# Patient Record
Sex: Female | Born: 1970 | Race: Black or African American | Hispanic: No | Smoking: Former smoker
Health system: Southern US, Community
[De-identification: ages and names within clinical notes are randomized; demographics above are authoritative.]

## PROBLEM LIST (undated history)

## (undated) DIAGNOSIS — M5136 Other intervertebral disc degeneration, lumbar region: Secondary | ICD-10-CM

## (undated) DIAGNOSIS — R200 Anesthesia of skin: Secondary | ICD-10-CM

## (undated) DIAGNOSIS — IMO0001 Reserved for inherently not codable concepts without codable children: Secondary | ICD-10-CM

## (undated) DIAGNOSIS — F329 Major depressive disorder, single episode, unspecified: Secondary | ICD-10-CM

## (undated) DIAGNOSIS — M51369 Other intervertebral disc degeneration, lumbar region without mention of lumbar back pain or lower extremity pain: Secondary | ICD-10-CM

## (undated) DIAGNOSIS — H409 Unspecified glaucoma: Secondary | ICD-10-CM

## (undated) DIAGNOSIS — E785 Hyperlipidemia, unspecified: Secondary | ICD-10-CM

## (undated) DIAGNOSIS — E039 Hypothyroidism, unspecified: Secondary | ICD-10-CM

## (undated) DIAGNOSIS — F32A Depression, unspecified: Secondary | ICD-10-CM

## (undated) DIAGNOSIS — F419 Anxiety disorder, unspecified: Secondary | ICD-10-CM

## (undated) DIAGNOSIS — M419 Scoliosis, unspecified: Secondary | ICD-10-CM

## (undated) DIAGNOSIS — E119 Type 2 diabetes mellitus without complications: Secondary | ICD-10-CM

## (undated) DIAGNOSIS — K429 Umbilical hernia without obstruction or gangrene: Secondary | ICD-10-CM

## (undated) DIAGNOSIS — K219 Gastro-esophageal reflux disease without esophagitis: Secondary | ICD-10-CM

## (undated) DIAGNOSIS — E079 Disorder of thyroid, unspecified: Secondary | ICD-10-CM

## (undated) DIAGNOSIS — S43006A Unspecified dislocation of unspecified shoulder joint, initial encounter: Secondary | ICD-10-CM

## (undated) HISTORY — PX: CERVICAL CONIZATION W/BX: SHX1330

## (undated) HISTORY — PX: INDUCED ABORTION: SHX677

## (undated) HISTORY — DX: Unspecified glaucoma: H40.9

## (undated) HISTORY — DX: Type 2 diabetes mellitus without complications: E11.9

---

## 1996-11-20 HISTORY — PX: TUBAL LIGATION: SHX77

## 1997-03-31 ENCOUNTER — Encounter: Admission: RE | Admit: 1997-03-31 | Discharge: 1997-05-07 | Payer: Self-pay | Admitting: *Deleted

## 1997-08-08 ENCOUNTER — Other Ambulatory Visit: Admission: RE | Admit: 1997-08-08 | Discharge: 1997-08-08 | Payer: Self-pay | Admitting: Otolaryngology

## 1997-09-02 ENCOUNTER — Emergency Department (HOSPITAL_COMMUNITY): Admission: EM | Admit: 1997-09-02 | Discharge: 1997-09-02 | Payer: Self-pay | Admitting: Emergency Medicine

## 1997-09-25 ENCOUNTER — Other Ambulatory Visit: Admission: RE | Admit: 1997-09-25 | Discharge: 1997-09-25 | Payer: Self-pay | Admitting: Family Medicine

## 1997-09-30 ENCOUNTER — Emergency Department (HOSPITAL_COMMUNITY): Admission: EM | Admit: 1997-09-30 | Discharge: 1997-09-30 | Payer: Self-pay | Admitting: Emergency Medicine

## 1998-07-17 ENCOUNTER — Other Ambulatory Visit: Admission: RE | Admit: 1998-07-17 | Discharge: 1998-07-17 | Payer: Self-pay | Admitting: Ophthalmology

## 1998-11-19 ENCOUNTER — Emergency Department (HOSPITAL_COMMUNITY): Admission: EM | Admit: 1998-11-19 | Discharge: 1998-11-19 | Payer: Self-pay | Admitting: Internal Medicine

## 1998-11-19 ENCOUNTER — Encounter: Payer: Self-pay | Admitting: Internal Medicine

## 1998-12-28 ENCOUNTER — Encounter: Admission: RE | Admit: 1998-12-28 | Discharge: 1998-12-28 | Payer: Self-pay | Admitting: Specialist

## 1999-01-02 ENCOUNTER — Encounter: Admission: RE | Admit: 1999-01-02 | Discharge: 1999-01-02 | Payer: Self-pay | Admitting: Specialist

## 1999-01-02 ENCOUNTER — Encounter: Payer: Self-pay | Admitting: Specialist

## 1999-03-01 HISTORY — PX: SHOULDER SURGERY: SHX246

## 1999-06-20 ENCOUNTER — Emergency Department (HOSPITAL_COMMUNITY): Admission: EM | Admit: 1999-06-20 | Discharge: 1999-06-20 | Payer: Self-pay | Admitting: Emergency Medicine

## 1999-06-20 ENCOUNTER — Encounter: Payer: Self-pay | Admitting: Emergency Medicine

## 1999-08-23 ENCOUNTER — Encounter: Admission: RE | Admit: 1999-08-23 | Discharge: 1999-09-15 | Payer: Self-pay | Admitting: Specialist

## 2001-09-01 ENCOUNTER — Encounter: Payer: Self-pay | Admitting: Emergency Medicine

## 2001-09-01 ENCOUNTER — Emergency Department (HOSPITAL_COMMUNITY): Admission: EM | Admit: 2001-09-01 | Discharge: 2001-09-01 | Payer: Self-pay | Admitting: Emergency Medicine

## 2001-09-05 ENCOUNTER — Encounter: Admission: RE | Admit: 2001-09-05 | Discharge: 2001-09-19 | Payer: Self-pay | Admitting: *Deleted

## 2001-12-07 ENCOUNTER — Emergency Department (HOSPITAL_COMMUNITY): Admission: EM | Admit: 2001-12-07 | Discharge: 2001-12-07 | Payer: Self-pay | Admitting: Emergency Medicine

## 2001-12-07 ENCOUNTER — Encounter: Payer: Self-pay | Admitting: Emergency Medicine

## 2002-05-16 ENCOUNTER — Emergency Department (HOSPITAL_COMMUNITY): Admission: EM | Admit: 2002-05-16 | Discharge: 2002-05-16 | Payer: Self-pay | Admitting: Emergency Medicine

## 2002-09-03 ENCOUNTER — Encounter: Payer: Self-pay | Admitting: Emergency Medicine

## 2002-09-03 ENCOUNTER — Encounter: Admission: RE | Admit: 2002-09-03 | Discharge: 2002-09-03 | Payer: Self-pay | Admitting: Emergency Medicine

## 2002-09-13 ENCOUNTER — Inpatient Hospital Stay (HOSPITAL_COMMUNITY): Admission: AD | Admit: 2002-09-13 | Discharge: 2002-09-14 | Payer: Self-pay | Admitting: Obstetrics & Gynecology

## 2002-12-26 ENCOUNTER — Encounter: Admission: RE | Admit: 2002-12-26 | Discharge: 2003-01-14 | Payer: Self-pay | Admitting: Emergency Medicine

## 2003-02-06 ENCOUNTER — Emergency Department (HOSPITAL_COMMUNITY): Admission: EM | Admit: 2003-02-06 | Discharge: 2003-02-06 | Payer: Self-pay

## 2003-03-01 HISTORY — PX: BACK SURGERY: SHX140

## 2003-04-22 ENCOUNTER — Ambulatory Visit (HOSPITAL_COMMUNITY): Admission: RE | Admit: 2003-04-22 | Discharge: 2003-04-22 | Payer: Self-pay | Admitting: Neurological Surgery

## 2003-05-29 ENCOUNTER — Inpatient Hospital Stay (HOSPITAL_COMMUNITY): Admission: RE | Admit: 2003-05-29 | Discharge: 2003-05-31 | Payer: Self-pay | Admitting: Neurological Surgery

## 2003-09-30 ENCOUNTER — Encounter: Admission: RE | Admit: 2003-09-30 | Discharge: 2003-11-13 | Payer: Self-pay | Admitting: Neurological Surgery

## 2003-10-02 ENCOUNTER — Other Ambulatory Visit: Admission: RE | Admit: 2003-10-02 | Discharge: 2003-10-02 | Payer: Self-pay | Admitting: Internal Medicine

## 2003-10-30 ENCOUNTER — Ambulatory Visit (HOSPITAL_COMMUNITY): Admission: RE | Admit: 2003-10-30 | Discharge: 2003-10-30 | Payer: Self-pay | Admitting: Neurological Surgery

## 2004-02-10 ENCOUNTER — Ambulatory Visit: Payer: Self-pay | Admitting: Family Medicine

## 2004-03-30 ENCOUNTER — Ambulatory Visit: Payer: Self-pay | Admitting: Family Medicine

## 2004-04-05 ENCOUNTER — Ambulatory Visit: Payer: Self-pay | Admitting: Family Medicine

## 2004-04-16 ENCOUNTER — Ambulatory Visit: Payer: Self-pay | Admitting: Family Medicine

## 2004-04-22 ENCOUNTER — Emergency Department (HOSPITAL_COMMUNITY): Admission: EM | Admit: 2004-04-22 | Discharge: 2004-04-22 | Payer: Self-pay | Admitting: Emergency Medicine

## 2004-04-26 ENCOUNTER — Ambulatory Visit: Payer: Self-pay | Admitting: Family Medicine

## 2004-08-02 ENCOUNTER — Emergency Department (HOSPITAL_COMMUNITY): Admission: EM | Admit: 2004-08-02 | Discharge: 2004-08-02 | Payer: Self-pay | Admitting: Family Medicine

## 2004-08-28 ENCOUNTER — Emergency Department (HOSPITAL_COMMUNITY): Admission: EM | Admit: 2004-08-28 | Discharge: 2004-08-28 | Payer: Self-pay | Admitting: Emergency Medicine

## 2004-09-21 ENCOUNTER — Ambulatory Visit: Payer: Self-pay | Admitting: Internal Medicine

## 2004-10-25 ENCOUNTER — Ambulatory Visit: Payer: Self-pay | Admitting: Family Medicine

## 2004-10-28 ENCOUNTER — Ambulatory Visit (HOSPITAL_COMMUNITY): Admission: RE | Admit: 2004-10-28 | Discharge: 2004-10-28 | Payer: Self-pay | Admitting: Family Medicine

## 2004-12-20 ENCOUNTER — Ambulatory Visit: Payer: Self-pay | Admitting: Family Medicine

## 2005-01-04 ENCOUNTER — Ambulatory Visit: Payer: Self-pay | Admitting: Family Medicine

## 2005-02-01 ENCOUNTER — Ambulatory Visit: Payer: Self-pay | Admitting: Family Medicine

## 2005-02-28 HISTORY — PX: ELBOW SURGERY: SHX618

## 2005-04-25 ENCOUNTER — Emergency Department (HOSPITAL_COMMUNITY): Admission: EM | Admit: 2005-04-25 | Discharge: 2005-04-25 | Payer: Self-pay | Admitting: Emergency Medicine

## 2005-04-28 ENCOUNTER — Encounter: Admission: RE | Admit: 2005-04-28 | Discharge: 2005-04-28 | Payer: Self-pay | Admitting: Orthopedic Surgery

## 2005-05-03 ENCOUNTER — Inpatient Hospital Stay (HOSPITAL_COMMUNITY): Admission: RE | Admit: 2005-05-03 | Discharge: 2005-05-05 | Payer: Self-pay | Admitting: Orthopedic Surgery

## 2005-06-09 ENCOUNTER — Encounter: Admission: RE | Admit: 2005-06-09 | Discharge: 2005-09-07 | Payer: Self-pay | Admitting: Orthopedic Surgery

## 2005-06-15 ENCOUNTER — Ambulatory Visit: Payer: Self-pay | Admitting: Family Medicine

## 2005-06-15 ENCOUNTER — Encounter (INDEPENDENT_AMBULATORY_CARE_PROVIDER_SITE_OTHER): Payer: Self-pay | Admitting: Family Medicine

## 2005-07-14 ENCOUNTER — Ambulatory Visit: Payer: Self-pay | Admitting: Family Medicine

## 2005-08-10 ENCOUNTER — Ambulatory Visit: Payer: Self-pay | Admitting: Family Medicine

## 2005-08-23 ENCOUNTER — Ambulatory Visit: Payer: Self-pay | Admitting: Family Medicine

## 2005-09-07 ENCOUNTER — Ambulatory Visit: Payer: Self-pay | Admitting: Family Medicine

## 2006-03-16 ENCOUNTER — Emergency Department (HOSPITAL_COMMUNITY): Admission: EM | Admit: 2006-03-16 | Discharge: 2006-03-16 | Payer: Self-pay | Admitting: Family Medicine

## 2006-10-02 ENCOUNTER — Ambulatory Visit: Payer: Self-pay | Admitting: Family Medicine

## 2006-10-03 ENCOUNTER — Encounter (INDEPENDENT_AMBULATORY_CARE_PROVIDER_SITE_OTHER): Payer: Self-pay | Admitting: Family Medicine

## 2006-10-03 LAB — CONVERTED CEMR LAB: TSH: 36.527 microintl units/mL — ABNORMAL HIGH (ref 0.350–5.50)

## 2006-10-24 DIAGNOSIS — E0500 Thyrotoxicosis with diffuse goiter without thyrotoxic crisis or storm: Secondary | ICD-10-CM | POA: Insufficient documentation

## 2006-10-24 DIAGNOSIS — I10 Essential (primary) hypertension: Secondary | ICD-10-CM | POA: Insufficient documentation

## 2006-10-24 DIAGNOSIS — Z87410 Personal history of cervical dysplasia: Secondary | ICD-10-CM | POA: Insufficient documentation

## 2006-10-24 DIAGNOSIS — E039 Hypothyroidism, unspecified: Secondary | ICD-10-CM | POA: Insufficient documentation

## 2006-10-26 DIAGNOSIS — J309 Allergic rhinitis, unspecified: Secondary | ICD-10-CM | POA: Insufficient documentation

## 2006-11-07 ENCOUNTER — Ambulatory Visit: Payer: Self-pay | Admitting: Family Medicine

## 2006-11-07 ENCOUNTER — Telehealth (INDEPENDENT_AMBULATORY_CARE_PROVIDER_SITE_OTHER): Payer: Self-pay | Admitting: *Deleted

## 2006-12-07 ENCOUNTER — Ambulatory Visit: Payer: Self-pay | Admitting: Family Medicine

## 2006-12-07 LAB — CONVERTED CEMR LAB: TSH: 7.989 microintl units/mL — ABNORMAL HIGH (ref 0.350–5.50)

## 2006-12-08 ENCOUNTER — Telehealth (INDEPENDENT_AMBULATORY_CARE_PROVIDER_SITE_OTHER): Payer: Self-pay | Admitting: Family Medicine

## 2007-08-16 ENCOUNTER — Ambulatory Visit: Payer: Self-pay | Admitting: Internal Medicine

## 2007-08-16 LAB — CONVERTED CEMR LAB
ALT: 14 units/L (ref 0–35)
AST: 17 units/L (ref 0–37)
Albumin: 4.1 g/dL (ref 3.5–5.2)
Alkaline Phosphatase: 70 units/L (ref 39–117)
BUN: 9 mg/dL (ref 6–23)
Basophils Absolute: 0 10*3/uL (ref 0.0–0.1)
Basophils Relative: 0 % (ref 0–1)
CO2: 23 meq/L (ref 19–32)
Calcium: 9 mg/dL (ref 8.4–10.5)
Chloride: 107 meq/L (ref 96–112)
Cholesterol: 157 mg/dL (ref 0–200)
Creatinine, Ser: 0.74 mg/dL (ref 0.40–1.20)
Eosinophils Absolute: 0.1 10*3/uL (ref 0.0–0.7)
Eosinophils Relative: 2 % (ref 0–5)
Glucose, Bld: 74 mg/dL (ref 70–99)
HCT: 41.1 % (ref 36.0–46.0)
HDL: 42 mg/dL (ref >39)
Hemoglobin: 12.2 g/dL (ref 12.0–15.0)
LDL Cholesterol: 93 mg/dL (ref 0–99)
Lymphocytes Relative: 35 % (ref 12–46)
Lymphs Abs: 2.3 10*3/uL (ref 0.7–4.0)
MCHC: 29.7 g/dL — ABNORMAL LOW (ref 30.0–36.0)
MCV: 97.9 fL (ref 78.0–100.0)
Monocytes Absolute: 0.4 10*3/uL (ref 0.1–1.0)
Monocytes Relative: 7 % (ref 3–12)
Neutro Abs: 3.7 10*3/uL (ref 1.7–7.7)
Neutrophils Relative %: 57 % (ref 43–77)
Platelets: 289 10*3/uL (ref 150–400)
Potassium: 4.6 meq/L (ref 3.5–5.3)
RBC: 4.2 M/uL (ref 3.87–5.11)
RDW: 15.7 % — ABNORMAL HIGH (ref 11.5–15.5)
Sodium: 140 meq/L (ref 135–145)
TSH: 2.572 microintl units/mL (ref 0.350–5.50)
Total Bilirubin: 0.9 mg/dL (ref 0.3–1.2)
Total CHOL/HDL Ratio: 3.7
Total Protein: 6.9 g/dL (ref 6.0–8.3)
Triglycerides: 109 mg/dL (ref <150)
VLDL: 22 mg/dL (ref 0–40)
WBC: 6.6 10*3/uL (ref 4.0–10.5)

## 2007-08-17 ENCOUNTER — Encounter (INDEPENDENT_AMBULATORY_CARE_PROVIDER_SITE_OTHER): Payer: Self-pay | Admitting: Internal Medicine

## 2007-08-17 ENCOUNTER — Ambulatory Visit (HOSPITAL_COMMUNITY): Admission: RE | Admit: 2007-08-17 | Discharge: 2007-08-17 | Payer: Self-pay | Admitting: Internal Medicine

## 2007-08-29 ENCOUNTER — Encounter (INDEPENDENT_AMBULATORY_CARE_PROVIDER_SITE_OTHER): Payer: Self-pay | Admitting: *Deleted

## 2007-09-25 ENCOUNTER — Telehealth (INDEPENDENT_AMBULATORY_CARE_PROVIDER_SITE_OTHER): Payer: Self-pay | Admitting: *Deleted

## 2007-09-26 ENCOUNTER — Ambulatory Visit: Payer: Self-pay | Admitting: Family Medicine

## 2007-09-26 DIAGNOSIS — N63 Unspecified lump in unspecified breast: Secondary | ICD-10-CM | POA: Insufficient documentation

## 2007-10-02 ENCOUNTER — Encounter: Admission: RE | Admit: 2007-10-02 | Discharge: 2007-10-02 | Payer: Self-pay | Admitting: Family Medicine

## 2008-02-29 DIAGNOSIS — R200 Anesthesia of skin: Secondary | ICD-10-CM

## 2008-02-29 HISTORY — DX: Anesthesia of skin: R20.0

## 2008-11-09 ENCOUNTER — Emergency Department (HOSPITAL_COMMUNITY): Admission: EM | Admit: 2008-11-09 | Discharge: 2008-11-09 | Payer: Self-pay | Admitting: Emergency Medicine

## 2009-05-21 ENCOUNTER — Encounter: Payer: Self-pay | Admitting: Physician Assistant

## 2010-03-21 ENCOUNTER — Encounter: Payer: Self-pay | Admitting: Neurological Surgery

## 2010-04-01 NOTE — Progress Notes (Signed)
  Phone Note Outgoing Call   Call placed by: Donia Guiles MD,  December 08, 2006 7:26 PM Call placed to: Patient Action Taken: Phone Call Completed Reason for Call: Discuss lab or test results Details of Action Taken: TSH a bit high-increase to 0.125 mg levothyroid    New/Updated Medications: SYNTHROID 125 MCG  TABS (LEVOTHYROXINE SODIUM) one by mouth q day   Prescriptions: SYNTHROID 125 MCG  TABS (LEVOTHYROXINE SODIUM) one by mouth q day  #30 x 6   Entered and Authorized by:   Donia Guiles MD   Signed by:   Donia Guiles MD on 12/08/2006   Method used:   Electronically sent to ...       [facility] [phone]       [address] [place] [address]       [place], [place]  [address]       Ph: [phone] or [phone]       Fax: [phone]   RxID:   [id number]

## 2010-04-01 NOTE — Progress Notes (Signed)
Summary: OFFICE VISIT/  PT IS UNDER A LOT OF PAIN/ DR Barbaraann Barthel  Phone Note Call from Patient Call back at [facility] Phone [phone]   Caller: Patient Complaint: Cough/Sore throat, Earache/Ear Infection Summary of Call: THE PT IS FEELING VERY BAD (BAD COUGH/SORE THROAT AND EARACHE/ SHE IS UNDER A LOT OF PAIN. SHE WANTS TO BE SEEN TODAY IF THAT IS POSSIBLE. DR Barbaraann Barthel PT Initial call taken by: Manon Hilding,  November 07, 2006 10:15 AM  Follow-up for Phone Call        Appt Scheduled Today Follow-up by: Gaylyn Cheers RN,  November 07, 2006 10:30 AM

## 2010-04-01 NOTE — Letter (Signed)
Summary: MAILED REQUESTED RECORDS TO [facility]  MAILED REQUESTED RECORDS TO [facility]   Imported By: Arta Bruce 05/21/2009 15:34:14  _____________________________________________________________________  External Attachment:    Type:   Image     Comment:   External Document

## 2010-06-04 LAB — URINALYSIS, ROUTINE W REFLEX MICROSCOPIC
Bilirubin Urine: NEGATIVE
Glucose, UA: NEGATIVE mg/dL
Ketones, ur: NEGATIVE mg/dL
Nitrite: NEGATIVE
Protein, ur: NEGATIVE mg/dL
Specific Gravity, Urine: 1.02 (ref 1.005–1.030)
Urobilinogen, UA: 1 mg/dL (ref 0.0–1.0)
pH: 5.5 (ref 5.0–8.0)

## 2010-06-04 LAB — DIFFERENTIAL
Basophils Absolute: 0 10*3/uL (ref 0.0–0.1)
Basophils Relative: 0 % (ref 0–1)
Eosinophils Absolute: 0.1 10*3/uL (ref 0.0–0.7)
Eosinophils Relative: 1 % (ref 0–5)
Lymphocytes Relative: 22 % (ref 12–46)
Lymphs Abs: 2.2 10*3/uL (ref 0.7–4.0)
Monocytes Absolute: 0.4 10*3/uL (ref 0.1–1.0)
Monocytes Relative: 4 % (ref 3–12)
Neutro Abs: 7.4 10*3/uL (ref 1.7–7.7)
Neutrophils Relative %: 73 % (ref 43–77)

## 2010-06-04 LAB — COMPREHENSIVE METABOLIC PANEL
ALT: 19 U/L (ref 0–35)
AST: 26 U/L (ref 0–37)
Albumin: 4 g/dL (ref 3.5–5.2)
Alkaline Phosphatase: 71 U/L (ref 39–117)
BUN: 14 mg/dL (ref 6–23)
CO2: 24 mEq/L (ref 19–32)
Calcium: 9 mg/dL (ref 8.4–10.5)
Chloride: 103 mEq/L (ref 96–112)
Creatinine, Ser: 1.18 mg/dL (ref 0.4–1.2)
GFR calc Af Amer: >60 mL/min (ref >60)
GFR calc non Af Amer: 51 mL/min — ABNORMAL LOW (ref >60)
Glucose, Bld: 143 mg/dL — ABNORMAL HIGH (ref 70–99)
Potassium: 3.5 mEq/L (ref 3.5–5.1)
Sodium: 137 mEq/L (ref 135–145)
Total Bilirubin: 0.7 mg/dL (ref 0.3–1.2)
Total Protein: 7.5 g/dL (ref 6.0–8.3)

## 2010-06-04 LAB — CBC
HCT: 37.1 % (ref 36.0–46.0)
Hemoglobin: 12.4 g/dL (ref 12.0–15.0)
MCHC: 33.3 g/dL (ref 30.0–36.0)
MCV: 91.9 fL (ref 78.0–100.0)
Platelets: 232 10*3/uL (ref 150–400)
RBC: 4.04 MIL/uL (ref 3.87–5.11)
RDW: 15 % (ref 11.5–15.5)
WBC: 10.2 10*3/uL (ref 4.0–10.5)

## 2010-06-04 LAB — GLUCOSE, CAPILLARY: Glucose-Capillary: 124 mg/dL — ABNORMAL HIGH (ref 70–99)

## 2010-06-04 LAB — URINE MICROSCOPIC-ADD ON

## 2010-06-04 LAB — POCT CARDIAC MARKERS
CKMB, poc: 1.1 ng/mL (ref 1.0–8.0)
CKMB, poc: <1 ng/mL — ABNORMAL LOW (ref 1.0–8.0)
Myoglobin, poc: 121 ng/mL (ref 12–200)
Myoglobin, poc: 89.8 ng/mL (ref 12–200)
Troponin i, poc: <0.05 ng/mL (ref 0.00–0.09)
Troponin i, poc: <0.05 ng/mL (ref 0.00–0.09)

## 2010-06-04 LAB — POCT PREGNANCY, URINE: Preg Test, Ur: NEGATIVE

## 2010-07-16 NOTE — Op Note (Signed)
NAMEELLAINA, SCHULER NO.:  [id number]   MEDICAL RECORD NO.:  [id number]          PATIENT TYPE:  INP   LOCATION:  5030                         FACILITY:  [facility]   PHYSICIAN:  Dionne Ano. Gramig III, M.D.DATE OF BIRTH:  April 29, 1970   DATE OF PROCEDURE:  05/03/2005  DATE OF DISCHARGE:                                 OPERATIVE REPORT   PREOPERATIVE DIAGNOSIS:  Fracture subluxation involving the radiocapitellar  joint, right elbow, with collateral ligament injury.   POSTOPERATIVE DIAGNOSIS:  Fracture subluxation involving the radiocapitellar  joint, right elbow, with collateral ligament injury.   PROCEDURE:  1.  Open reduction and internal fixation and fracture subluxation, right      elbow, with capitellum shear fracture.  2.  Lateral ulnar collateral ligament reconstruction and repair.  3.  Stress radiography.   SURGEON:  Dionne Ano. Amanda Pea, M.D.   ASSISTANT:  Karie Chimera, P.A.-C.   COMPLICATIONS:  None.   ANESTHESIA:  General.   TOURNIQUET TIME:  Less than an hour.   COMPLICATIONS:  None.   INDICATION FOR PROCEDURE:  This patient is a very pleasant female who  presents with a capitellum shear fracture.  She has a fracture subluxation  of her elbow secondary to the fracture.  She has collateral ligament  insufficiency, pain and presents for reconstructions.  I discussed the risks  and benefits of surgery including the risks of infection, bleeding,  anesthesia, damage to normal structures and failure of surgery to accomplish  it intended goals of relieving symptoms and restoring function; with this in  mind, she desired to proceed.  All questions have been encouraged and  answered preoperatively.   OPERATIVE PROCEDURE:  The patient was seen by myself and Anesthesia and  taken to the operating suite, where she underwent smooth induction of IV  sedation followed by general anesthesia.  She was laid supine and  appropriately padded, prepped and draped in  the normal sterile fashion with  Betadine scrub and paint.  Once this was done, arm was elevated and  tourniquet was insufflated to 350 mmHg, Ancef was given and the operation  commenced with a lateral incision.  The lateral incision was carried down to  the fascia.  Skin edges were elevated off of the fascia.  I then performed  creation of an interval about the ECRB and ECU.  I carefully elevated the  ECRB and ECRL anteriorly, exposing the elbow joint and the capitellum  fracture.  The patient had the joint lavaged.  I evaluated this very  carefully.  Following this, meticulous and tedious dissection was  accomplished with combination of curettage, Freer elevator, distraction and  towel clamp.  I then performed ORIF of the very displaced capitellum shear  fracture.  The joint was thus reduced, restoring the radiocapitellar joint  anatomy.  I then placed 2 Accutrak screws, mini in nature, size 18 in  length, from back to front, engaging the capitellum; this provided excellent  fixation of the capitellum shear fracture.  This was checked under AP  lateral x-ray and oblique x-ray.  I performed guidepin placement, followed  by measurement, followed by placement of the screws.  I took care not to  violate the subchondral bone and checked this with visual confirmation via  4.0 loupe magnification.  The patient was placed through a full range of  motion and I was quite pleased with the findings.  Following this, I then  performed repair with FilterWire of the ulnar collateral ligament.  This was  tightened nicely and repaired back down to its origin, where previous trauma  had injured it.  Once this was done, I then performed copious irrigation  with the tourniquet down; final copy x-rays were made and stress fluoroscopy  was performed.  I then closed the wound with FilterWire in the fascia,  followed by Vicryl in the subcu and skin edge was then closed.  A posterior  plaster splint was  accomplished.  She was awoken from anesthesia,  transferred to the recovery room, given additional antibiotics and will be  admitted for pain control, postoperative observation, etc.  The patient  tolerated the procedure well, there were no complicating features and all  sponge, needle and instrument counts were reported as correct.  She will be  monitored closely in the postoperative period and of course followed in my  office.           ______________________________  Dionne Ano. Everlene Other, M.D.     Nash Mantis  D:  05/04/2005  T:  05/05/2005  Job:  [id number]

## 2010-07-16 NOTE — Op Note (Signed)
Karen Nixon, Karen Nixon                            ACCOUNT NO.:  [id number]   MEDICAL RECORD NO.:  [id number]                   PATIENT TYPE:  INP   LOCATION:  2899                                 FACILITY:  [facility]   PHYSICIAN:  Stefani Dama, M.D.               DATE OF BIRTH:  11/14/1970   DATE OF PROCEDURE:  05/29/2003  DATE OF DISCHARGE:                                 OPERATIVE REPORT   PREOPERATIVE DIAGNOSIS:  Lumbar spondylosis and herniated nucleus pulposus  with left lumbar radiculopathy and spondylosis L3-4 and L4-5.  Herniated  nucleus pulposus L5-S1, left with left lumbar radiculopathy.   POSTOPERATIVE DIAGNOSIS:  Lumbar spondylosis and herniated nucleus pulposus  with left lumbar radiculopathy and spondylosis L3-4 and L4-5.  Herniated  nucleus pulposus L5-S1, left with left lumbar radiculopathy.   OPERATION PERFORMED:  L3-4, L4-5 and L5-S1 lumbar laminotomies, L5-S1  diskectomy with operating microscope and microdissection technique.   SURGEON:  Danae Orleans. Venetia Maxon, M.D.   ANESTHESIA:  General endotracheal.   INDICATIONS FOR PROCEDURE:  The patient is a 40 year old individual who has  had significant back and left lower extremity pain.  She has evidence of  herniated nucleus pulposus at L5-S1 on the left side in addition to severe  spondylitic disease at 3-4 and 4-5.  She has been advised regarding surgical  decompression having failed extensive efforts at conservative management.   DESCRIPTION OF PROCEDURE:  The patient was brought to the operating room  supine on the stretcher.  After smooth induction of general endotracheal  anesthesia, she was turned prone, her back was shaved, prepped with DuraPrep  and draped in sterile fashion.  A midline incision was created on the  left  side of the lumbar spine.  The lumbodorsal fascia was opened on the left  side and a marker was placed at the L3 interspace to identify the L3-4 disk  space.  Dissection was then carried inferiorly  to expose L3-4, L4-5 and L5-  S1.  Subperiosteal dissection was performed and then a McCullough retractor  was placed in the wound to retract the tissues laterally.  At L5-S1 a  laminotomy was then created removing the inferior margin and lamina of L5  out the mesial wall of the facet using a high speed drill and a 5 mm round  bit.  Dissection was then taken with the 2 and 3 mm Kerrison punch to remove  thickened redundant yellow ligament.  As the common dural tube was  identified, the lateral aspect of it was noted to be severely compressed  with severe lateral recess stenosis.  This involved the S1 nerve root.  The  nerve root was noted to be blanched and dorsally displaced.  Further  dissection of the yellow ligament freed the nerve and by dissected on the  lateral aspect using a microdissection technique under the operating  microscope, the nerve could be gradually mobilized.  Once the nerve was  mobilized there were noted to be several fragments of disk underneath the  nerve root and these were immediately decompressed.  The area was inspected  carefully and no other fragments of disk were encountered.  The opening in  the posterior longitudinal ligament where the disk had come from was  carefully searched for and there was noted to be a very small rent with a  very closed disk space.  It was not felt that it would be possible to see  any more disk material coming through this space and therefore, it was not  explored or opened further.  With this area being decompressed, attention  was then turned to L4-5 and L3-4.  Here again with the aid of microscopic  visualization, laminotomy was created removing the inferior margin of the  lamina of L4 out to the mesial wall of the facet.  Medial facetectomy was  also performed, the yellow ligament was taken up and the common dural tube  was explored.  There was noted to be some lateral recess stenosis of the L5  nerve root and this was  decompressed using the high speed air drill with a  2.5 mm dissecting tool.  Once this area was decompressed, again care was  taken to make sure that the disk was not degenerated or exposed or open.  There was noted to be a small central bulge in this area; however, it was  noted that the ligament was intact and therefore it was not violated.  Dissection was then carried up to 3-4 where a similar procedure was carried  out doing a laminotomy removing the inferior margin of the lamina of L3 out  to the mesial wall of the facet and removing the redundant yellow ligament.  Common dural tube here was explored and there was noted to be some lateral  recess stenosis along the L4 nerve root inferiorly.  This was cleared with a  high speed air drill and a 2.8 mm dissecting tool.  Microdissection  technique was used throughout.  Dr. Venetia Maxon helped with this case to provide  retraction on the common dural tube and the S1 nerve root.  Hemostasis was  then carefully and meticulously obtained in the epidural space.  40 mg of  Depo-Medrol, 25 mcg of fentanyl was left in the epidural space and the  lumbodorsal fascia was reapproximated in its original position and closed  with #1 Vicryl.  2-0 Vicryl was used in the subcutaneous tissues and  subcuticular tissue was closed with 3-0 Vicryl.  The patient tolerated the  procedure well and was returned to the recovery room in stable condition.                                               Stefani Dama, M.D.    [name]  D:  05/29/2003  T:  05/29/2003  Job:  [id number]

## 2010-07-16 NOTE — Discharge Summary (Signed)
Karen Nixon, Karen Nixon                            ACCOUNT NO.:  [id number]   MEDICAL RECORD NO.:  [id number]                   PATIENT TYPE:  INP   LOCATION:  3013                                 FACILITY:  [facility]   PHYSICIAN:  Stefani Dama, M.D.               DATE OF BIRTH:  May 28, 1970   DATE OF ADMISSION:  05/29/2003  DATE OF DISCHARGE:  05/31/2003                                 DISCHARGE SUMMARY   ADMITTING DIAGNOSES:  1. Lumbar spondylosis.  2. Herniated nucleus pulposus, L5-S1 on the left.  3. Left lumbar radiculopathy.   DISCHARGE AND FINAL DIAGNOSES:  1. Lumbar spondylosis.  2. Herniated nucleus pulposus, L5-S1 on the left.  3. Left lumbar radiculopathy.   OPERATIONS:  1. Laminotomy L3-L4, L4-L5, and L5-S1.  2. Diskectomy L5-S1 with operating microscope, microdissection technique, on     May 29, 2003.   CONDITION ON DISCHARGE:  Improving.   HOSPITAL COURSE:  Ms. Racanelli is a 40 year old individual who has had  significant back and left lower extremity  pain.  She was found to have  significant spondylotic disease at three levels, in addition to what  appeared to a herniated nucleus pulposus at L5-S1.  She underwent surgical  extirpation of the disk.  Postoperatively, she had a fair amount of back  soreness secondary to the extent of the operation.  She was ambulated during  the first postoperative day and gradually improved to where she became  mobile using only oral pain medication.  She was able to void.  Her incision  was clean and dry.  On the second postoperative day, she was doing well  enough to be able to handle herself at home with her children only.   She is discharged home with a prescription for:  1. Percocet #40, without refills.  2. Valium 5 mg #30, without refills.   She will be seen in the office in three weeks' time for further followup.                                                Stefani Dama, M.D.    [name]  D:  05/31/2003  T:   06/01/2003  Job:  [id number]

## 2010-09-01 ENCOUNTER — Emergency Department (HOSPITAL_COMMUNITY)
Admission: EM | Admit: 2010-09-01 | Discharge: 2010-09-01 | Disposition: A | Discharge status: Home / Self Care | Payer: Self-pay | Attending: Emergency Medicine | Admitting: Emergency Medicine

## 2010-09-01 ENCOUNTER — Emergency Department (HOSPITAL_COMMUNITY): Payer: Self-pay

## 2010-09-01 DIAGNOSIS — R5381 Other malaise: Secondary | ICD-10-CM | POA: Insufficient documentation

## 2010-09-01 DIAGNOSIS — Z79899 Other long term (current) drug therapy: Secondary | ICD-10-CM | POA: Insufficient documentation

## 2010-09-01 DIAGNOSIS — R112 Nausea with vomiting, unspecified: Secondary | ICD-10-CM | POA: Insufficient documentation

## 2010-09-01 DIAGNOSIS — G43909 Migraine, unspecified, not intractable, without status migrainosus: Secondary | ICD-10-CM | POA: Insufficient documentation

## 2010-09-01 LAB — POCT I-STAT, CHEM 8
BUN: 17 mg/dL (ref 6–23)
Calcium, Ion: 1.2 mmol/L (ref 1.12–1.32)
Chloride: 112 mEq/L (ref 96–112)
Creatinine, Ser: 0.8 mg/dL (ref 0.50–1.10)
Glucose, Bld: 68 mg/dL — ABNORMAL LOW (ref 70–99)
HCT: 36 % (ref 36.0–46.0)
Hemoglobin: 12.2 g/dL (ref 12.0–15.0)
Potassium: 3.6 mEq/L (ref 3.5–5.1)
Sodium: 144 mEq/L (ref 135–145)
TCO2: 22 mmol/L (ref 0–100)

## 2010-09-01 LAB — URINALYSIS, ROUTINE W REFLEX MICROSCOPIC
Bilirubin Urine: NEGATIVE
Glucose, UA: NEGATIVE mg/dL
Ketones, ur: NEGATIVE mg/dL
Leukocytes, UA: NEGATIVE
Nitrite: NEGATIVE
Protein, ur: NEGATIVE mg/dL
Specific Gravity, Urine: 1.026 (ref 1.005–1.030)
Urobilinogen, UA: 0.2 mg/dL (ref 0.0–1.0)
pH: 5.5 (ref 5.0–8.0)

## 2010-09-01 LAB — URINE MICROSCOPIC-ADD ON

## 2010-09-01 LAB — PREGNANCY, URINE: Preg Test, Ur: NEGATIVE

## 2011-06-07 ENCOUNTER — Encounter (HOSPITAL_COMMUNITY): Payer: Self-pay | Admitting: *Deleted

## 2011-06-07 ENCOUNTER — Emergency Department (INDEPENDENT_AMBULATORY_CARE_PROVIDER_SITE_OTHER)
Admission: EM | Admit: 2011-06-07 | Discharge: 2011-06-07 | Disposition: A | Discharge status: Home / Self Care | Payer: Self-pay | Source: Home / Self Care | Attending: Emergency Medicine | Admitting: Emergency Medicine

## 2011-06-07 DIAGNOSIS — K115 Sialolithiasis: Secondary | ICD-10-CM

## 2011-06-07 HISTORY — DX: Other intervertebral disc degeneration, lumbar region without mention of lumbar back pain or lower extremity pain: M51.369

## 2011-06-07 HISTORY — DX: Other intervertebral disc degeneration, lumbar region: M51.36

## 2011-06-07 HISTORY — DX: Hyperlipidemia, unspecified: E78.5

## 2011-06-07 HISTORY — DX: Disorder of thyroid, unspecified: E07.9

## 2011-06-07 HISTORY — DX: Scoliosis, unspecified: M41.9

## 2011-06-07 NOTE — ED Notes (Addendum)
C/O when she eats she gets a lump the size of a pebble under her tongue (midline)  Begins when starts to eat and goes away about 30 minutes after she finishes eating. Not painful. Onset a week ago.Says bump turns whiteish gray when it is there and then goes back to normal.

## 2011-06-07 NOTE — ED Provider Notes (Signed)
History     CSN: [id number]  Arrival date & time 06/07/11  4098   First MD Initiated Contact with Patient 06/07/11 1936      Chief Complaint  Patient presents with  . Oral Swelling    (Consider location/radiation/quality/duration/timing/severity/associated sxs/prior treatment) HPI Comments: 41 year old female with history of hypothyroidism. Here c/o discomfort below her tongue for 1 week. Explains she feels a knot below her tongue that forms while she is eating and takes about 30 min after finishing her meal to go away. Denies pain. Symptoms started 1 week ago. No purulent discharge. No fever, chills or malaise. Has not had this problems before.    Past Medical History  Diagnosis Date  . Thyroid disease   . Hyperlipemia   . Degenerative disc disease, lumbar   . Scoliosis     Past Surgical History  Procedure Date  . Back surgery   . Shoulder surgery   . Elbow surgery   . Tubal ligation   . Cervical conization w/bx   . Induced abortion     Family History  Problem Relation Age of Onset  . Cancer Mother   . Hypertension Brother     History  Substance Use Topics  . Smoking status: Former Games developer  . Smokeless tobacco: Never Used  . Alcohol Use: Yes     occasionally    OB History    Grav Para Term Preterm Abortions TAB SAB Ect Mult Living   3 2 2  1     2       Review of Systems  Constitutional: Negative for fever, chills and appetite change.  HENT: Negative for congestion, sore throat, facial swelling, trouble swallowing and neck pain.   Gastrointestinal: Negative for abdominal pain.  All other systems reviewed and are negative.    Allergies  Iohexol and Naproxen sodium  Home Medications  No current outpatient prescriptions on file.  BP 124/91  Pulse 80  Temp(Src) 98.4 F (36.9 C) (Oral)  Resp 18  SpO2 100%  LMP 04/13/2011  Physical Exam  Nursing note and vitals reviewed. Constitutional: She appears well-developed and well-nourished. No distress.   HENT:  Head: Normocephalic and atraumatic.  Nose: Nose normal.  Mouth/Throat: Oropharynx is clear and moist. No oropharyngeal exudate.       Mild swelling with no erythema or tenderness to palpation of the left sublingual salivary duct. Impress small stone at same location on palpation of the mouth floor. No pus drainage while milking the sublingual salivary glands. No pain. No mucosal ulcers or lesions.   Eyes: Conjunctivae are normal. Pupils are equal, round, and reactive to light.  Neck: Neck supple.  Cardiovascular: Normal heart sounds.   Pulmonary/Chest: Effort normal and breath sounds normal.  Lymphadenopathy:    She has no cervical adenopathy.  Neurological: She is alert.  Skin: No rash noted.    ED Course  Procedures (including critical care time)  Labs Reviewed - No data to display No results found.   1. Sialolithiasis       MDM  supportive care reccommended. Warm compress, good hydration sour candy and instructed how to milk the sublingual salivary glands. ENT referral as needed.           Sharin Grave, MD 06/10/11 [phone]

## 2011-06-07 NOTE — Discharge Instructions (Signed)
My impression is that you have a small stone obstructing the duct of your left salivary gland below your tongue. It does not look infected. Is very important to keep well-hydrated and you can also use warm compress and lemon drop candy to try to force the stone out and keep the fluid of saliva normal. Go to the ENT specialist number provided above if persistent or worsening symptoms. Is very important to seek medical attention if you develop pain, persistent swelling or purulent discharge as sometimes these problems can lead to infection and in that case he would require antibiotic treatment and close monitoring.   Salivary Stone Your exam shows you have a stone in one of your saliva glands. These small stones form around a mucous plug in the ducts of the glands and cause the saliva in the gland to be blocked. This makes the gland swollen and painful, especially when you eat. If repeated episodes occur, the gland can become infected. Sometimes these stones can be seen on x-ray. Treatment includes stimulating the production of saliva to push the stone out. You should suck on a lemon or sour candies several times daily. Antibiotic medicine may be needed if the gland is infected. Increasing fluids, applying warm compresses to the swollen area 3-4 times daily, and massaging the gland from back to front may encourage drainage and passage of the stone. Surgical treatment to remove the stone is sometimes necessary, so proper medical follow up is very important. Call your doctor for an appointment as recommended. Call right away if you have a high fever, severe headache, vomiting, uncontrolled pain, or other serious symptoms. Document Released: 03/24/2004 Document Revised: 02/03/2011 Document Reviewed: 02/14/2005 [facility] Patient Information 2012 [place], [place].

## 2011-06-15 ENCOUNTER — Emergency Department (HOSPITAL_COMMUNITY): Payer: Medicaid Other

## 2011-06-15 ENCOUNTER — Encounter (HOSPITAL_COMMUNITY): Payer: Self-pay | Admitting: *Deleted

## 2011-06-15 ENCOUNTER — Emergency Department (HOSPITAL_COMMUNITY)
Admission: EM | Admit: 2011-06-15 | Discharge: 2011-06-15 | Disposition: A | Discharge status: Home / Self Care | Payer: Medicaid Other | Attending: Emergency Medicine | Admitting: Emergency Medicine

## 2011-06-15 DIAGNOSIS — M545 Low back pain, unspecified: Secondary | ICD-10-CM | POA: Insufficient documentation

## 2011-06-15 DIAGNOSIS — M79609 Pain in unspecified limb: Secondary | ICD-10-CM | POA: Insufficient documentation

## 2011-06-15 DIAGNOSIS — E785 Hyperlipidemia, unspecified: Secondary | ICD-10-CM | POA: Insufficient documentation

## 2011-06-15 DIAGNOSIS — N390 Urinary tract infection, site not specified: Secondary | ICD-10-CM

## 2011-06-15 DIAGNOSIS — M51379 Other intervertebral disc degeneration, lumbosacral region without mention of lumbar back pain or lower extremity pain: Secondary | ICD-10-CM | POA: Insufficient documentation

## 2011-06-15 DIAGNOSIS — M5137 Other intervertebral disc degeneration, lumbosacral region: Secondary | ICD-10-CM | POA: Insufficient documentation

## 2011-06-15 DIAGNOSIS — M4120 Other idiopathic scoliosis, site unspecified: Secondary | ICD-10-CM | POA: Insufficient documentation

## 2011-06-15 DIAGNOSIS — M549 Dorsalgia, unspecified: Secondary | ICD-10-CM

## 2011-06-15 LAB — URINALYSIS, ROUTINE W REFLEX MICROSCOPIC
Bilirubin Urine: NEGATIVE
Glucose, UA: NEGATIVE mg/dL
Hgb urine dipstick: NEGATIVE
Ketones, ur: 15 mg/dL — AB
Nitrite: NEGATIVE
Protein, ur: NEGATIVE mg/dL
Specific Gravity, Urine: 1.024 (ref 1.005–1.030)
Urobilinogen, UA: 2 mg/dL — ABNORMAL HIGH (ref 0.0–1.0)
pH: 6.5 (ref 5.0–8.0)

## 2011-06-15 LAB — URINE MICROSCOPIC-ADD ON

## 2011-06-15 LAB — POCT PREGNANCY, URINE: Preg Test, Ur: NEGATIVE

## 2011-06-15 MED ORDER — IBUPROFEN 800 MG PO TABS: 800.0000 mg | ORAL_TABLET | Freq: Three times a day (TID) | ORAL | Status: AC

## 2011-06-15 MED ORDER — OXYCODONE-ACETAMINOPHEN 5-325 MG PO TABS
2.0000 | ORAL_TABLET | Freq: Once | ORAL | Status: AC
  Administered 2011-06-15: 2 via ORAL
  Filled 2011-06-15: qty 2

## 2011-06-15 MED ORDER — OXYCODONE-ACETAMINOPHEN 5-325 MG PO TABS
1.0000 | ORAL_TABLET | Freq: Once | ORAL | Status: AC
  Administered 2011-06-15: 1 via ORAL
  Filled 2011-06-15: qty 1

## 2011-06-15 MED ORDER — DIAZEPAM 5 MG PO TABS
10.0000 mg | ORAL_TABLET | Freq: Once | ORAL | Status: AC
  Administered 2011-06-15: 10 mg via ORAL
  Filled 2011-06-15: qty 2

## 2011-06-15 MED ORDER — SULFAMETHOXAZOLE-TRIMETHOPRIM 800-160 MG PO TABS: 1.0000 | ORAL_TABLET | Freq: Two times a day (BID) | ORAL | Status: AC

## 2011-06-15 MED ORDER — OXYCODONE-ACETAMINOPHEN 5-325 MG PO TABS: ORAL_TABLET | ORAL | Status: AC

## 2011-06-15 MED ORDER — IBUPROFEN 800 MG PO TABS
800.0000 mg | ORAL_TABLET | Freq: Once | ORAL | Status: AC
  Administered 2011-06-15: 800 mg via ORAL
  Filled 2011-06-15: qty 1

## 2011-06-15 NOTE — ED Notes (Signed)
Patient with back pain, patient states that her pain is worse than ever.  Patient having a hard time moving.  Patient is CAOx3.

## 2011-06-15 NOTE — ED Notes (Signed)
Ambulated pt in hallway 25 feet with assistance of her cane.  Pt needed assistance getting from lying to sitting position in bed. When walking, pt reports having increasing dull radiating pain down her left leg.  Pt describes pain starting in her hip and "feels like something is weighing down and pushing down my bladder and my lower back like a fire in my hip".  Pt stated pain is about the same as it was when she came in to the ED - rates pain 10/10.  Pt does report pain was better when she way laying down but is worse when she walks.

## 2011-06-15 NOTE — ED Provider Notes (Signed)
Pt is s/p lumbar diskectomy in 2005 Here for back pain  She is awake/alert, maex4, denies urinary/fecal incontinence Will treat pain and reassess BP 108/82  Pulse 92  Temp(Src) 98.1 F (36.7 C) (Oral)  Resp 20  SpO2 98%  LMP 04/13/2011   Joya Gaskins, MD 06/15/11 [phone]

## 2011-06-15 NOTE — ED Provider Notes (Signed)
History     CSN: [id number]  Arrival date & time 06/15/11  7829   First MD Initiated Contact with Patient 06/15/11 [phone]      Chief Complaint  Patient presents with  . Back Pain    lower    (Consider location/radiation/quality/duration/timing/severity/associated sxs/prior treatment) HPI  Patient who states she has some degree of daily chronic back pain following "lower back surgery" in 2005 by Dr. Danielle Dess presents to the ER complaining of a one week hx of gradually increasing lower back pain stating mild increase in pain throughout the week despite taking OTC pain meds but states pain acutely became worse this morning when she was getting out of the bed to use the bathroom and she felt a "pop" sensation in left lower back and since then increased pain with radiation of pain down left leg. Patient denies abdominal pain, loss of bowel or bladder function, saddle seat paresthesias, extremity numbness/tingling/weakness, dysuria, hematuria or blood in stool. Patient states pain is aggravated by movement but constant. Patient is also complaining of a few day hx of mild suprapubic fullness though again denies dysuria, hematuria or flank pain. She denies increased urinary frequency or hesitation. Patient is followed by Teena Irani at [facility]. She states allergy to naproxen but is able to take ibuprofen.  Past Medical History  Diagnosis Date  . Thyroid disease   . Hyperlipemia   . Degenerative disc disease, lumbar   . Scoliosis     Past Surgical History  Procedure Date  . Back surgery   . Shoulder surgery   . Elbow surgery   . Tubal ligation   . Cervical conization w/bx   . Induced abortion     Family History  Problem Relation Age of Onset  . Cancer Mother   . Hypertension Brother     History  Substance Use Topics  . Smoking status: Former Games developer  . Smokeless tobacco: Never Used  . Alcohol Use: Yes     occasionally    OB History    Grav Para Term Preterm  Abortions TAB SAB Ect Mult Living   3 2 2  1     2       Review of Systems  All other systems reviewed and are negative.    Allergies  Iohexol and Naproxen sodium  Home Medications  No current outpatient prescriptions on file.  BP 108/82  Pulse 92  Temp(Src) 98.1 F (36.7 C) (Oral)  Resp 20  SpO2 98%  LMP 04/13/2011  Physical Exam  Nursing note and vitals reviewed. Constitutional: She is oriented to person, place, and time. She appears well-developed and well-nourished. No distress.  HENT:  Head: Normocephalic and atraumatic.  Eyes: Conjunctivae are normal.  Neck: Normal range of motion. Neck supple.  Cardiovascular: Normal rate, regular rhythm, normal heart sounds and intact distal pulses.  Exam reveals no gallop and no friction rub.   No murmur heard. Pulmonary/Chest: Effort normal and breath sounds normal. No respiratory distress. She has no wheezes. She has no rales. She exhibits no tenderness.  Abdominal: Soft. Bowel sounds are normal. She exhibits no distension and no mass. There is no tenderness. There is no rebound and no guarding.  Genitourinary: Rectum normal. Rectal exam shows anal tone normal.  Musculoskeletal: Normal range of motion. She exhibits no edema and no tenderness.       TTP of lower lumbar spine and left paraspinal region. No skin changes or crepitous.   Neurological: She is alert and  oriented to person, place, and time. She has normal reflexes.  Skin: Skin is warm and dry. No rash noted. She is not diaphoretic. No erythema.  Psychiatric: She has a normal mood and affect.    ED Course  Procedures (including critical care time)  PO percocet and valium.   PO ibuprofen   Labs Reviewed  URINALYSIS, ROUTINE W REFLEX MICROSCOPIC   Dg Lumbar Spine Complete  06/15/2011  *RADIOLOGY REPORT*  Clinical Data: Lambert Mody lower back pain.  LUMBAR SPINE - COMPLETE 4+ VIEW  Comparison: Lumbar spine radiograph performed 04/22/2004  Findings: There is no evidence  of fracture or subluxation.  Right convex thoracic scoliosis is noted.  Vertebral bodies demonstrate normal height and alignment.  Intervertebral disc spaces are preserved.  The visualized bowel gas pattern is unremarkable in appearance; air and stool are noted within the colon.  The sacroiliac joints are within normal limits.  IMPRESSION: Right convex thoracic scoliosis noted; no evidence of fracture or subluxation along the lumbar spine.  Original Report Authenticated By: Tonia Ghent, M.D.     1. Back pain   2. UTI (lower urinary tract infection)       MDM  Acute on chronic lower back pain with no signs or symptoms of central cord compression or cauda equina. Negative straight leg raise. Normal rectal tone. Normal sensation of entire LE's and saddle seat region. Ambulating without difficulty. Bilateral extremities are neurovasc intact. No TTP of chest or abdomen  Patient's pain has improved after PO percocet and valium           Jenness [place], [place] 06/15/11 1441

## 2011-06-15 NOTE — ED Notes (Signed)
Pt has hx of back surgery in 2005.  Pt sts she got up to bathroom and heard a pop in lower back and now hard to walk.  Pt has pain to lift legs.  No numbness or tingling and complains of weight on bladder for one week

## 2011-06-15 NOTE — Discharge Instructions (Signed)
Take antibiotic in complete course and stay well hydrated. Follow up with your primary care provider for recheck of your urine in one week. Alternate between percocet and ibuprofen as directed for back pain but do not drive or operate machinery with percocet use. Follow up with Dr. Danielle Dess for recheck of ongoing back pain but return to ER for any changing or worsening of symptoms.   Urinary Tract Infection Infections of the urinary tract can start in several places. A bladder infection (cystitis), a kidney infection (pyelonephritis), and a prostate infection (prostatitis) are different types of urinary tract infections (UTIs). They usually get better if treated with medicines (antibiotics) that kill germs. Take all the medicine until it is gone. You or your child may feel better in a few days, but TAKE ALL MEDICINE or the infection may not respond and may become more difficult to treat. HOME CARE INSTRUCTIONS   Drink enough water and fluids to keep the urine clear or pale yellow. Cranberry juice is especially recommended, in addition to large amounts of water.   Avoid caffeine, tea, and carbonated beverages. They tend to irritate the bladder.   Alcohol may irritate the prostate.   Only take over-the-counter or prescription medicines for pain, discomfort, or fever as directed by your caregiver.  To prevent further infections:  Empty the bladder often. Avoid holding urine for long periods of time.   After a bowel movement, women should cleanse from front to back. Use each tissue only once.   Empty the bladder before and after sexual intercourse.  FINDING OUT THE RESULTS OF YOUR TEST Not all test results are available during your visit. If your or your child's test results are not back during the visit, make an appointment with your caregiver to find out the results. Do not assume everything is normal if you have not heard from your caregiver or the medical facility. It is important for you to  follow up on all test results. SEEK MEDICAL CARE IF:   There is back pain.   Your baby is older than 3 months with a rectal temperature of 100.5 F (38.1 C) or higher for more than 1 day.   Your or your child's problems (symptoms) are no better in 3 days. Return sooner if you or your child is getting worse.  SEEK IMMEDIATE MEDICAL CARE IF:   There is severe back pain or lower abdominal pain.   You or your child develops chills.   You have a fever.   Your baby is older than 3 months with a rectal temperature of 102 F (38.9 C) or higher.   Your baby is 44 months old or younger with a rectal temperature of 100.4 F (38 C) or higher.   There is nausea or vomiting.   There is continued burning or discomfort with urination.  MAKE SURE YOU:   Understand these instructions.   Will watch your condition.   Will get help right away if you are not doing well or get worse.  Document Released: 11/24/2004 Document Revised: 02/03/2011 Document Reviewed: 06/29/2006 [facility] Patient Information 2012 [place], [place].  Back Pain, Adult Low back pain is very common. About 1 in 5 people have back pain.The cause of low back pain is rarely dangerous. The pain often gets better over time.About half of people with a sudden onset of back pain feel better in just 2 weeks. About 8 in 10 people feel better by 6 weeks.  CAUSES Some common causes of back pain include:  Strain of the muscles or ligaments supporting the spine.   Wear and tear (degeneration) of the spinal discs.   Arthritis.   Direct injury to the back.  DIAGNOSIS Most of the time, the direct cause of low back pain is not known.However, back pain can be treated effectively even when the exact cause of the pain is unknown.Answering your caregiver's questions about your overall health and symptoms is one of the most accurate ways to make sure the cause of your pain is not dangerous. If your caregiver needs more information, he or she  may order lab work or imaging tests (X-rays or MRIs).However, even if imaging tests show changes in your back, this usually does not require surgery. HOME CARE INSTRUCTIONS For many people, back pain returns.Since low back pain is rarely dangerous, it is often a condition that people can learn to [facility] their own.   Remain active. It is stressful on the back to sit or stand in one place. Do not sit, drive, or stand in one place for more than 30 minutes at a time. Take short walks on level surfaces as soon as pain allows.Try to increase the length of time you walk each day.   Do not stay in bed.Resting more than 1 or 2 days can delay your recovery.   Do not avoid exercise or work.Your body is made to move.It is not dangerous to be active, even though your back may hurt.Your back will likely heal faster if you return to being active before your pain is gone.   Pay attention to your body when you bend and lift. Many people have less discomfortwhen lifting if they bend their knees, keep the load close to their bodies,and avoid twisting. Often, the most comfortable positions are those that put less stress on your recovering back.   Find a comfortable position to sleep. Use a firm mattress and lie on your side with your knees slightly bent. If you lie on your back, put a pillow under your knees.   Only take over-the-counter or prescription medicines as directed by your caregiver. Over-the-counter medicines to reduce pain and inflammation are often the most helpful.Your caregiver may prescribe muscle relaxant drugs.These medicines help dull your pain so you can more quickly return to your normal activities and healthy exercise.   Put ice on the injured area.   Put ice in a plastic bag.   Place a towel between your skin and the bag.   Leave the ice on for 15 to 20 minutes, 3 to 4 times a day for the first 2 to 3 days. After that, ice and heat may be alternated to reduce pain and spasms.     Ask your caregiver about trying back exercises and gentle massage. This may be of some benefit.   Avoid feeling anxious or stressed.Stress increases muscle tension and can worsen back pain.It is important to recognize when you are anxious or stressed and learn ways to manage it.Exercise is a great option.  SEEK MEDICAL CARE IF:  You have pain that is not relieved with rest or medicine.   You have pain that does not improve in 1 week.   You have new symptoms.   You are generally not feeling well.  SEEK IMMEDIATE MEDICAL CARE IF:   You have pain that radiates from your back into your legs.   You develop new bowel or bladder control problems.   You have unusual weakness or numbness in your arms or legs.  You develop nausea or vomiting.   You develop abdominal pain.   You feel faint.  Document Released: 02/14/2005 Document Revised: 02/03/2011 Document Reviewed: 07/05/2010 [facility] Patient Information 2012 [place], [place].

## 2011-06-22 NOTE — ED Provider Notes (Signed)
Medical screening examination/treatment/procedure(s) were performed by non-physician practitioner and as supervising physician I was immediately available for consultation/collaboration.  Sunnie Nielsen, MD 06/22/11 2303

## 2012-05-10 ENCOUNTER — Other Ambulatory Visit (HOSPITAL_COMMUNITY): Payer: Self-pay | Admitting: Internal Medicine

## 2012-05-10 DIAGNOSIS — Z1231 Encounter for screening mammogram for malignant neoplasm of breast: Secondary | ICD-10-CM

## 2012-05-17 ENCOUNTER — Ambulatory Visit (HOSPITAL_COMMUNITY)
Admission: RE | Admit: 2012-05-17 | Discharge: 2012-05-17 | Disposition: A | Discharge status: Home / Self Care | Payer: Medicaid Other | Source: Ambulatory Visit | Attending: Internal Medicine | Admitting: Internal Medicine

## 2012-05-17 DIAGNOSIS — Z1231 Encounter for screening mammogram for malignant neoplasm of breast: Secondary | ICD-10-CM | POA: Insufficient documentation

## 2012-12-07 ENCOUNTER — Other Ambulatory Visit: Payer: Self-pay | Admitting: Orthopaedic Surgery

## 2012-12-07 DIAGNOSIS — M25511 Pain in right shoulder: Secondary | ICD-10-CM

## 2012-12-18 ENCOUNTER — Ambulatory Visit
Admission: RE | Admit: 2012-12-18 | Discharge: 2012-12-18 | Disposition: A | Discharge status: Home / Self Care | Payer: Medicaid Other | Source: Ambulatory Visit | Attending: Orthopaedic Surgery | Admitting: Orthopaedic Surgery

## 2012-12-18 DIAGNOSIS — M25511 Pain in right shoulder: Secondary | ICD-10-CM

## 2012-12-18 MED ORDER — IOHEXOL 180 MG/ML  SOLN
15.0000 mL | Freq: Once | INTRAMUSCULAR | Status: AC | PRN
  Administered 2012-12-18: 15 mL via INTRA_ARTICULAR

## 2013-02-18 ENCOUNTER — Encounter: Payer: Self-pay | Admitting: Advanced Practice Midwife

## 2013-03-01 ENCOUNTER — Ambulatory Visit: Payer: Self-pay | Admitting: Advanced Practice Midwife

## 2013-08-09 ENCOUNTER — Other Ambulatory Visit (HOSPITAL_COMMUNITY): Payer: Self-pay | Admitting: Internal Medicine

## 2013-08-09 DIAGNOSIS — Z0000 Encounter for general adult medical examination without abnormal findings: Secondary | ICD-10-CM

## 2013-08-13 ENCOUNTER — Other Ambulatory Visit (HOSPITAL_COMMUNITY): Payer: Self-pay | Admitting: Internal Medicine

## 2013-08-13 ENCOUNTER — Ambulatory Visit (HOSPITAL_COMMUNITY)
Admission: RE | Admit: 2013-08-13 | Discharge: 2013-08-13 | Disposition: A | Discharge status: Home / Self Care | Payer: Medicaid Other | Source: Ambulatory Visit | Attending: Internal Medicine | Admitting: Internal Medicine

## 2013-08-13 DIAGNOSIS — Z1231 Encounter for screening mammogram for malignant neoplasm of breast: Secondary | ICD-10-CM

## 2013-08-13 DIAGNOSIS — Z0000 Encounter for general adult medical examination without abnormal findings: Secondary | ICD-10-CM

## 2013-12-30 ENCOUNTER — Encounter (HOSPITAL_COMMUNITY): Payer: Self-pay | Admitting: *Deleted

## 2014-03-20 ENCOUNTER — Other Ambulatory Visit (HOSPITAL_COMMUNITY): Payer: Self-pay | Admitting: Oral and Maxillofacial Surgery

## 2014-03-20 DIAGNOSIS — D492 Neoplasm of unspecified behavior of bone, soft tissue, and skin: Secondary | ICD-10-CM

## 2014-03-20 DIAGNOSIS — D165 Benign neoplasm of lower jaw bone: Secondary | ICD-10-CM

## 2014-03-25 ENCOUNTER — Ambulatory Visit (HOSPITAL_COMMUNITY)
Admission: RE | Admit: 2014-03-25 | Discharge: 2014-03-25 | Disposition: A | Discharge status: Home / Self Care | Payer: Medicaid Other | Source: Ambulatory Visit | Attending: Oral and Maxillofacial Surgery | Admitting: Oral and Maxillofacial Surgery

## 2014-03-25 DIAGNOSIS — D492 Neoplasm of unspecified behavior of bone, soft tissue, and skin: Secondary | ICD-10-CM | POA: Diagnosis not present

## 2014-03-25 DIAGNOSIS — D165 Benign neoplasm of lower jaw bone: Secondary | ICD-10-CM

## 2014-04-08 ENCOUNTER — Ambulatory Visit: Payer: Medicaid Other | Admitting: Certified Nurse Midwife

## 2014-04-21 ENCOUNTER — Ambulatory Visit: Payer: Self-pay | Admitting: Obstetrics & Gynecology

## 2014-06-27 ENCOUNTER — Emergency Department (HOSPITAL_COMMUNITY): Payer: Medicaid Other

## 2014-06-27 ENCOUNTER — Emergency Department (HOSPITAL_COMMUNITY)
Admission: EM | Admit: 2014-06-27 | Discharge: 2014-06-27 | Disposition: A | Discharge status: Home / Self Care | Payer: Medicaid Other | Attending: Emergency Medicine | Admitting: Emergency Medicine

## 2014-06-27 ENCOUNTER — Encounter (HOSPITAL_COMMUNITY): Payer: Self-pay | Admitting: Emergency Medicine

## 2014-06-27 DIAGNOSIS — Z87891 Personal history of nicotine dependence: Secondary | ICD-10-CM | POA: Diagnosis not present

## 2014-06-27 DIAGNOSIS — M549 Dorsalgia, unspecified: Secondary | ICD-10-CM

## 2014-06-27 DIAGNOSIS — M419 Scoliosis, unspecified: Secondary | ICD-10-CM | POA: Insufficient documentation

## 2014-06-27 DIAGNOSIS — G8929 Other chronic pain: Secondary | ICD-10-CM | POA: Diagnosis not present

## 2014-06-27 DIAGNOSIS — M545 Low back pain: Secondary | ICD-10-CM | POA: Insufficient documentation

## 2014-06-27 DIAGNOSIS — Z8639 Personal history of other endocrine, nutritional and metabolic disease: Secondary | ICD-10-CM | POA: Diagnosis not present

## 2014-06-27 DIAGNOSIS — Z3202 Encounter for pregnancy test, result negative: Secondary | ICD-10-CM | POA: Diagnosis not present

## 2014-06-27 LAB — URINALYSIS, ROUTINE W REFLEX MICROSCOPIC
Bilirubin Urine: NEGATIVE
Glucose, UA: NEGATIVE mg/dL
Ketones, ur: NEGATIVE mg/dL
Nitrite: NEGATIVE
Protein, ur: NEGATIVE mg/dL
Specific Gravity, Urine: 1.02 (ref 1.005–1.030)
Urobilinogen, UA: 0.2 mg/dL (ref 0.0–1.0)
pH: 5 (ref 5.0–8.0)

## 2014-06-27 LAB — PREGNANCY, URINE: Preg Test, Ur: NEGATIVE

## 2014-06-27 LAB — URINE MICROSCOPIC-ADD ON

## 2014-06-27 MED ORDER — PREDNISONE 10 MG PO TABS: ORAL_TABLET | ORAL | Status: DC
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
## 2014-06-27 NOTE — ED Notes (Addendum)
Pt arrived from home by [facility] with c/o lower back pain. Pain started Friday last week, was seen by pain clinic Monday and was started on new medication Nucynta. Pt stated that the medication is not helping. Pain originally on left side of back and now on right side of back for the past 2 days. EMS administered Fentanyl 13mcg with no relief.

## 2014-06-27 NOTE — Discharge Instructions (Signed)
Follow up with your pain doctor for continued or worsening symptoms Chronic Pain Chronic pain can be defined as pain that is off and on and lasts for 3-6 months or longer. Many things cause chronic pain, which can make it difficult to make a diagnosis. There are many treatment options available for chronic pain. However, finding a treatment that works well for you may require trying various approaches until the right one is found. Many people benefit from a combination of two or more types of treatment to control their pain. SYMPTOMS  Chronic pain can occur anywhere in the body and can range from mild to very severe. Some types of chronic pain include:  Headache.  Low back pain.  Cancer pain.  Arthritis pain.  Neurogenic pain. This is pain resulting from damage to nerves. People with chronic pain may also have other symptoms such as:  Depression.  Anger.  Insomnia.  Anxiety. DIAGNOSIS  Your health care provider will help diagnose your condition over time. In many cases, the initial focus will be on excluding possible conditions that could be causing the pain. Depending on your symptoms, your health care provider may order tests to diagnose your condition. Some of these tests may include:   Blood tests.   CT scan.   MRI.   X-rays.   Ultrasounds.   Nerve conduction studies.  You may need to see a specialist.  TREATMENT  Finding treatment that works well may take time. You may be referred to a pain specialist. He or she may prescribe medicine or therapies, such as:   Mindful meditation or yoga.  Shots (injections) of numbing or pain-relieving medicines into the spine or area of pain.  Local electrical stimulation.  Acupuncture.   Massage therapy.   Aroma, color, light, or sound therapy.   Biofeedback.   Working with a physical therapist to keep from getting stiff.   Regular, gentle exercise.   Cognitive or behavioral therapy.   Group support.   Sometimes, surgery may be recommended.  HOME CARE INSTRUCTIONS   Take all medicines as directed by your health care provider.   Lessen stress in your life by relaxing and doing things such as listening to calming music.   Exercise or be active as directed by your health care provider.   Eat a healthy diet and include things such as vegetables, fruits, fish, and lean meats in your diet.   Keep all follow-up appointments with your health care provider.   Attend a support group with others suffering from chronic pain. SEEK MEDICAL CARE IF:   Your pain gets worse.   You develop a new pain that was not there before.   You cannot tolerate medicines given to you by your health care provider.   You have new symptoms since your last visit with your health care provider.  SEEK IMMEDIATE MEDICAL CARE IF:   You feel weak.   You have decreased sensation or numbness.   You lose control of bowel or bladder function.   Your pain suddenly gets much worse.   You develop shaking.  You develop chills.  You develop confusion.  You develop chest pain.  You develop shortness of breath.  MAKE SURE YOU:  Understand these instructions.  Will watch your condition.  Will get help right away if you are not doing well or get worse. Document Released: 11/06/2001 Document Revised: 10/17/2012 Document Reviewed: 08/10/2012 [facility] Patient Information 2015 [place], [place]. This information is not intended to replace advice given to  you by your health care provider. Make sure you discuss any questions you have with your health care provider.

## 2014-06-28 LAB — URINE CULTURE: Colony Count: >=100000

## 2014-07-29 ENCOUNTER — Emergency Department (HOSPITAL_COMMUNITY): Payer: Medicaid Other

## 2014-07-29 ENCOUNTER — Encounter (HOSPITAL_COMMUNITY): Payer: Self-pay | Admitting: Emergency Medicine

## 2014-07-29 ENCOUNTER — Emergency Department (HOSPITAL_COMMUNITY)
Admission: EM | Admit: 2014-07-29 | Discharge: 2014-07-30 | Disposition: A | Discharge status: Home / Self Care | Payer: Medicaid Other | Attending: Emergency Medicine | Admitting: Emergency Medicine

## 2014-07-29 DIAGNOSIS — S43034A Inferior dislocation of right humerus, initial encounter: Secondary | ICD-10-CM | POA: Diagnosis not present

## 2014-07-29 DIAGNOSIS — S4991XA Unspecified injury of right shoulder and upper arm, initial encounter: Secondary | ICD-10-CM | POA: Diagnosis present

## 2014-07-29 DIAGNOSIS — S43014A Anterior dislocation of right humerus, initial encounter: Secondary | ICD-10-CM | POA: Diagnosis not present

## 2014-07-29 DIAGNOSIS — Z79899 Other long term (current) drug therapy: Secondary | ICD-10-CM | POA: Insufficient documentation

## 2014-07-29 DIAGNOSIS — E079 Disorder of thyroid, unspecified: Secondary | ICD-10-CM | POA: Diagnosis not present

## 2014-07-29 DIAGNOSIS — Y92009 Unspecified place in unspecified non-institutional (private) residence as the place of occurrence of the external cause: Secondary | ICD-10-CM | POA: Insufficient documentation

## 2014-07-29 DIAGNOSIS — X58XXXA Exposure to other specified factors, initial encounter: Secondary | ICD-10-CM | POA: Insufficient documentation

## 2014-07-29 DIAGNOSIS — Z8739 Personal history of other diseases of the musculoskeletal system and connective tissue: Secondary | ICD-10-CM | POA: Insufficient documentation

## 2014-07-29 DIAGNOSIS — Y998 Other external cause status: Secondary | ICD-10-CM | POA: Insufficient documentation

## 2014-07-29 DIAGNOSIS — M24411 Recurrent dislocation, right shoulder: Secondary | ICD-10-CM

## 2014-07-29 DIAGNOSIS — Y9389 Activity, other specified: Secondary | ICD-10-CM | POA: Insufficient documentation

## 2014-07-29 DIAGNOSIS — E785 Hyperlipidemia, unspecified: Secondary | ICD-10-CM | POA: Diagnosis not present

## 2014-07-29 DIAGNOSIS — Z87891 Personal history of nicotine dependence: Secondary | ICD-10-CM | POA: Insufficient documentation

## 2014-07-29 LAB — CBC WITH DIFFERENTIAL/PLATELET
Basophils Absolute: 0 10*3/uL (ref 0.0–0.1)
Basophils Relative: 0 % (ref 0–1)
Eosinophils Absolute: 0.1 10*3/uL (ref 0.0–0.7)
Eosinophils Relative: 1 % (ref 0–5)
HCT: 41.8 % (ref 36.0–46.0)
Hemoglobin: 13.9 g/dL (ref 12.0–15.0)
Lymphocytes Relative: 38 % (ref 12–46)
Lymphs Abs: 3.8 10*3/uL (ref 0.7–4.0)
MCH: 29 pg (ref 26.0–34.0)
MCHC: 33.3 g/dL (ref 30.0–36.0)
MCV: 87.1 fL (ref 78.0–100.0)
Monocytes Absolute: 0.6 10*3/uL (ref 0.1–1.0)
Monocytes Relative: 6 % (ref 3–12)
Neutro Abs: 5.6 10*3/uL (ref 1.7–7.7)
Neutrophils Relative %: 55 % (ref 43–77)
Platelets: 239 10*3/uL (ref 150–400)
RBC: 4.8 MIL/uL (ref 3.87–5.11)
RDW: 15.4 % (ref 11.5–15.5)
WBC: 10.1 10*3/uL (ref 4.0–10.5)

## 2014-07-29 NOTE — ED Notes (Signed)
Pt. dislocated her right shoulder joint at home while trying to reach up this evening .

## 2014-07-30 ENCOUNTER — Emergency Department (HOSPITAL_COMMUNITY): Payer: Medicaid Other

## 2014-07-30 LAB — COMPREHENSIVE METABOLIC PANEL
ALT: 24 U/L (ref 14–54)
AST: 19 U/L (ref 15–41)
Albumin: 3.9 g/dL (ref 3.5–5.0)
Alkaline Phosphatase: 87 U/L (ref 38–126)
Anion gap: 9 (ref 5–15)
BUN: 17 mg/dL (ref 6–20)
CO2: 24 mmol/L (ref 22–32)
Calcium: 9.3 mg/dL (ref 8.9–10.3)
Chloride: 105 mmol/L (ref 101–111)
Creatinine, Ser: 0.99 mg/dL (ref 0.44–1.00)
GFR calc Af Amer: >60 mL/min (ref >60)
GFR calc non Af Amer: >60 mL/min (ref >60)
Glucose, Bld: 109 mg/dL — ABNORMAL HIGH (ref 65–99)
Potassium: 4.2 mmol/L (ref 3.5–5.1)
Sodium: 138 mmol/L (ref 135–145)
Total Bilirubin: 0.9 mg/dL (ref 0.3–1.2)
Total Protein: 7.6 g/dL (ref 6.5–8.1)

## 2014-07-30 MED ORDER — HYDROMORPHONE HCL 1 MG/ML IJ SOLN
1.0000 mg | Freq: Once | INTRAMUSCULAR | Status: AC
  Administered 2014-07-30: 1 mg via INTRAVENOUS
  Filled 2014-07-30: qty 1

## 2014-07-30 MED ORDER — SODIUM CHLORIDE 0.9 % IV BOLUS (SEPSIS)
1000.0000 mL | Freq: Once | INTRAVENOUS | Status: AC
  Administered 2014-07-30: 1000 mL via INTRAVENOUS

## 2014-07-30 MED ORDER — PROPOFOL 10 MG/ML IV BOLUS
2.0000 mg/kg | Freq: Once | INTRAVENOUS | Status: AC
  Administered 2014-07-30: 60 mg via INTRAVENOUS
  Filled 2014-07-30: qty 20

## 2014-07-30 MED ORDER — ONDANSETRON HCL 4 MG/2ML IJ SOLN
4.0000 mg | Freq: Once | INTRAMUSCULAR | Status: AC
  Administered 2014-07-30: 4 mg via INTRAVENOUS
  Filled 2014-07-30: qty 2

## 2014-07-30 NOTE — Discharge Instructions (Signed)
Shoulder Dislocation  Your shoulder is made up of three bones: the collar bone (clavicle); the shoulder blade (scapula), which includes the socket (glenoid cavity); and the upper arm bone (humerus). Your shoulder joint is the place where these bones meet. Strong, fibrous tissues hold these bones together (ligaments). Muscles and strong, fibrous tissues that connect the muscles to these bones (tendons) allow your arm to move through this joint. The range of motion of your shoulder joint is more extensive than most of your other joints, and the glenoid cavity is very shallow. That is the reason that your shoulder joint is one of the most unstable joints in your body. It is far more prone to dislocation than your other joints. Shoulder dislocation is when your humerus is forced out of your shoulder joint.  CAUSES  Shoulder dislocation is caused by a forceful impact on your shoulder. This impact usually is from an injury, such as a sports injury or a fall.  SYMPTOMS  Symptoms of shoulder dislocation include:  · Deformity of your shoulder.  · Intense pain.  · Inability to move your shoulder joint.  · Numbness, weakness, or tingling around your shoulder joint (your neck or down your arm).  · Bruising or swelling around your shoulder.  DIAGNOSIS  In order to diagnose a dislocated shoulder, your caregiver will perform a physical exam. Your caregiver also may have an X-ray exam done to see if you have any broken bones. Magnetic resonance imaging (MRI) is a procedure that sometimes is done to help your caregiver see any damage to the soft tissues around your shoulder, particularly your rotator cuff tendons. Additionally, your caregiver also may have electromyography done to measure the electrical discharges produced in your muscles if you have signs or symptoms of nerve damage.  TREATMENT  A shoulder dislocation is treated by placing the humerus back in the joint (reduction). Your caregiver does this either manually (closed  reduction), by moving your humerus back into the joint through manipulation, or through surgery (open reduction). When your humerus is back in place, severe pain should improve almost immediately.  You also may need to have surgery if you have a weak shoulder joint or ligaments, and you have recurring shoulder dislocations, despite rehabilitation. In rare cases, surgery is necessary if your nerves or blood vessels are damaged during the dislocation.  After your reduction, your arm will be placed in a shoulder immobilizer or sling to keep it from moving. Your caregiver will have you wear your shoulder immobilizer or sling for 3 days to 3 weeks, depending on how serious your dislocation is. When your shoulder immobilizer or sling is removed, your caregiver may prescribe physical therapy to help improve the range of motion in your shoulder joint.  HOME CARE INSTRUCTIONS   The following measures can help to reduce pain and speed up the healing process:  · Rest your injured joint. Do not move it. Avoid activities similar to the one that caused your injury.  · Apply ice to your injured joint for the first day or two after your reduction or as directed by your caregiver. Applying ice helps to reduce inflammation and pain.  ¨ Put ice in a plastic bag.  ¨ Place a towel between your skin and the bag.  ¨ Leave the ice on for 15-20 minutes at a time, every 2 hours while you are awake.  · Exercise your hand by squeezing a soft ball. This helps to eliminate stiffness and swelling in your hand and   wrist.  · Take over-the-counter or prescription medicine for pain or discomfort as told by your caregiver.  SEEK IMMEDIATE MEDICAL CARE IF:   · Your shoulder immobilizer or sling becomes damaged.  · Your pain becomes worse rather than better.  · You lose feeling in your arm or hand, or they become white and cold.  MAKE SURE YOU:   · Understand these instructions.  · Will watch your condition.  · Will get help right away if you are not  doing well or get worse.  Document Released: 11/09/2000 Document Revised: 07/01/2013 Document Reviewed: 12/05/2010  [employer]® Patient Information ©2015 [employer]. This information is not intended to replace advice given to you by your health care provider. Make sure you discuss any questions you have with your health care provider.

## 2014-07-30 NOTE — ED Provider Notes (Signed)
CSN: [id number]     Arrival date & time 07/29/14  2251 History   This chart was scribed for Ernestina Patches, MD by Chester Holstein, ED Scribe. This patient was seen in room [facility] and the patient's care was started at 12:16 AM.    Chief Complaint  Patient presents with  . Dislocation   Patient is a 44 y.o. female presenting with arm injury. The history is provided by the patient. No language interpreter was used.  Arm Injury Location:  Shoulder Time since incident:  2 hours Shoulder location:  R shoulder Pain details:    Quality:  Unable to specify   Radiates to:  Does not radiate   Severity:  Severe   Onset quality:  Sudden   Duration:  2 hours   Timing:  Constant   Progression:  Unchanged Chronicity:  Recurrent Dislocation: yes   Prior injury to area:  Yes Relieved by:  Nothing Worsened by:  Nothing tried Ineffective treatments:  None tried Associated symptoms: decreased range of motion   Associated symptoms: no back pain, no fatigue, no fever, no neck pain and no numbness    HPI Comments: Karen Nixon is a 44 y.o. female who presents to the Emergency Department complaining of right shoulder dislocation at 10:30 PM. Pt states she was reaching up to grab something at onset. She notes h/o of same. She reports h/o surgery in 2001. She states it has dislocated twice since. She states she is allergic to Aleve and contrast. Pt denies numbness.   Past Medical History  Diagnosis Date  . Thyroid disease   . Hyperlipemia   . Degenerative disc disease, lumbar   . Scoliosis    Past Surgical History  Procedure Laterality Date  . Back surgery    . Shoulder surgery    . Elbow surgery    . Tubal ligation    . Cervical conization w/bx    . Induced abortion     Family History  Problem Relation Age of Onset  . Cancer Mother   . Hypertension Brother    History  Substance Use Topics  . Smoking status: Former Research scientist (life sciences)  . Smokeless tobacco: Never Used  . Alcohol Use: Yes   Comment: occasionally   OB History    Gravida Para Term Preterm AB TAB SAB Ectopic Multiple Living   3 2 2  1     2      Review of Systems  Constitutional: Negative for fever, chills, diaphoresis, activity change, appetite change and fatigue.  HENT: Negative for congestion, facial swelling, rhinorrhea and sore throat.   Eyes: Negative for photophobia and discharge.  Respiratory: Negative for cough, chest tightness and shortness of breath.   Cardiovascular: Negative for chest pain, palpitations and leg swelling.  Gastrointestinal: Negative for nausea, vomiting, abdominal pain and diarrhea.  Endocrine: Negative for polydipsia and polyuria.  Genitourinary: Negative for dysuria, frequency, difficulty urinating and pelvic pain.  Musculoskeletal: Positive for myalgias and arthralgias. Negative for back pain, neck pain and neck stiffness.  Skin: Negative for color change and wound.  Allergic/Immunologic: Negative for immunocompromised state.  Neurological: Negative for facial asymmetry, weakness, numbness and headaches.  Hematological: Does not bruise/bleed easily.  Psychiatric/Behavioral: Negative for confusion and agitation.      Allergies  Iohexol and Naproxen sodium  Home Medications   Prior to Admission medications   Medication Sig Start Date End Date Taking? Authorizing Provider  Biotin w/ Vitamins C & E (HAIR SKIN & NAILS GUMMIES PO) Take 2 tablets  by mouth daily.   Yes Historical Provider, MD  buprenorphine (BUTRANS - DOSED MCG/HR) 5 MCG/HR PTWK patch Place 5 mcg onto the skin See admin instructions. Weekly as needed for pain   Yes Historical Provider, MD  calcium carbonate (TUMS - DOSED IN MG ELEMENTAL CALCIUM) 500 MG chewable tablet Chew 3 tablets by mouth daily as needed for indigestion or heartburn.   Yes Historical Provider, MD  levothyroxine (SYNTHROID, LEVOTHROID) 125 MCG tablet Take 125 mcg by mouth daily before breakfast.   Yes Historical Provider, MD  lovastatin  (MEVACOR) 20 MG tablet Take 20 mg by mouth at bedtime.   Yes Historical Provider, MD  Multiple Vitamin (MULTIVITAMIN WITH MINERALS) TABS tablet Take 1 tablet by mouth daily.   Yes Historical Provider, MD  psyllium (HYDROCIL/METAMUCIL) 95 % PACK Take 1 packet by mouth 2 (two) times daily as needed for mild constipation.   Yes Historical Provider, MD  Tapentadol HCl 100 MG TABS Take 50-100 mg by mouth 4 (four) times daily as needed (pain).    Yes Historical Provider, MD  tetrahydrozoline-zinc (VISINE-AC) 0.05-0.25 % ophthalmic solution Place 1 drop into both eyes 3 (three) times daily as needed (dry eyes).   Yes Historical Provider, MD   BP 140/89 mmHg  Pulse 75  Temp(Src) 98.3 F (36.8 C) (Oral)  Resp 13  Ht 5\' 5"  (1.651 m)  Wt 199 lb (90.266 kg)  BMI 33.12 kg/m2  SpO2 96%  LMP 06/28/2014 Physical Exam  Constitutional: She is oriented to person, place, and time. She appears well-developed and well-nourished. No distress.  HENT:  Head: Normocephalic.  Mouth/Throat: Oropharynx is clear and moist.  Eyes: Pupils are equal, round, and reactive to light.  Neck: Neck supple.  Cardiovascular: Normal rate, regular rhythm and normal heart sounds.   Pulmonary/Chest: Effort normal and breath sounds normal. No respiratory distress. She has no wheezes.  Abdominal: Soft. She exhibits no distension. There is no tenderness. There is no rebound and no guarding.  Musculoskeletal: She exhibits no edema.       Right shoulder: She exhibits pain.  NVI distally but has pain over right shoulder.  Neurological: She is alert and oriented to person, place, and time.  Skin: Skin is warm and dry.  Psychiatric: She has a normal mood and affect.  Nursing note and vitals reviewed.   ED Course  Procedures (including critical care time) DIAGNOSTIC STUDIES: Oxygen Saturation is 95% on room air, normal by my interpretation.    COORDINATION OF CARE: 12:22 AM Discussed treatment plan with patient at beside, the  patient agrees with the plan and has no further questions at this time.   Labs Review Labs Reviewed  COMPREHENSIVE METABOLIC PANEL - Abnormal; Notable for the following:    Glucose, Bld 109 (*)    All other components within normal limits  CBC WITH DIFFERENTIAL/PLATELET    Imaging Review Dg Shoulder Right  07/30/2014   CLINICAL DATA:  Status post reduction of right humeral head dislocation. Initial encounter.  EXAM: RIGHT SHOULDER - 2+ VIEW  COMPARISON:  Right shoulder radiographs performed 07/29/2014  FINDINGS: There has been successful reduction of the patient's right humeral head dislocation. No definite Hill-Sachs or osseous Bankart lesion is seen.  The right acromioclavicular joint is unremarkable in appearance. The visualized portions of the right lung are clear.  IMPRESSION: Successful reduction of right humeral head dislocation. No definite Hill-Sachs or osseous Bankart lesion seen.   Electronically Signed   By: Garald Balding M.D.   On:  07/30/2014 02:33   Dg Shoulder Right  07/29/2014   CLINICAL DATA:  Right shoulder dislocation. Occurred while reaching for remote.  EXAM: RIGHT SHOULDER - 2+ VIEW  COMPARISON:  None.  FINDINGS: Anterior inferior shoulder dislocation. No grossly displaced fracture. Acromioclavicular joint remains congruent.  IMPRESSION: Anterior inferior shoulder dislocation.   Electronically Signed   By: Jeb Levering M.D.   On: 07/29/2014 23:43     EKG Interpretation None      REDUCTION PROCEDURE NOTE: Patient identification was confirmed, consent was obtained.  This procedure was performed at 1:04 AM by Ernestina Patches, MD Site: right shoulder Anesthetic used (type and amt): Diprivan 74ml Pre-procedure N/V exam # of attempts: 2 Pt anesthetized, fx/dislocation reduced successfully.  Patient tolerated procedure well without complications.  Post-procedure exam indicates patient is n/v intact distal to the injury site.  Post-procedure films show excellent  alignment.  Patient returned to baseline prior to disposition.  Instructions for care discussed verbally and patient provided with additional written instructions for homecare and f/u.   MDM   Final diagnoses:  Shoulder dislocation, recurrent, right    Pt is a 44 y.o. female with Pmhx as above who presents with recurrent L shoulder dislocation after reaching above head. Unable to reduce w/o sedation. XR shows no obvious fx. Reduction done under conscious sedation w/ assistance by Dr. Rogers Blocker. Pt tolerated procedure well, shoulder appears unstable. Post reduction XR w/o obvious fx. Pt placed in sling, asked to f/u with ortho as outpt.      I personally performed the services described in this documentation, which was scribed in my presence. The recorded information has been reviewed and is accurate.      Ernestina Patches, MD 07/31/14 1150

## 2014-07-30 NOTE — ED Notes (Signed)
MD at bedside. 

## 2014-09-03 ENCOUNTER — Other Ambulatory Visit (HOSPITAL_COMMUNITY): Payer: Self-pay | Admitting: Orthopaedic Surgery

## 2014-09-08 ENCOUNTER — Encounter (HOSPITAL_COMMUNITY): Payer: Self-pay | Admitting: *Deleted

## 2014-09-08 MED ORDER — CEFAZOLIN SODIUM-DEXTROSE 2-3 GM-% IV SOLR
2.0000 g | INTRAVENOUS | Status: AC
  Administered 2014-09-09: 2 g via INTRAVENOUS
  Filled 2014-09-08: qty 50

## 2014-09-09 ENCOUNTER — Ambulatory Visit (HOSPITAL_COMMUNITY): Payer: Medicaid Other | Admitting: Certified Registered Nurse Anesthetist

## 2014-09-09 ENCOUNTER — Encounter (HOSPITAL_COMMUNITY): Payer: Self-pay | Admitting: *Deleted

## 2014-09-09 ENCOUNTER — Encounter (HOSPITAL_COMMUNITY)
Admission: RE | Disposition: A | Discharge status: Home / Self Care | Payer: Self-pay | Source: Ambulatory Visit | Attending: Orthopaedic Surgery

## 2014-09-09 ENCOUNTER — Ambulatory Visit (HOSPITAL_COMMUNITY)
Admission: RE | Admit: 2014-09-09 | Discharge: 2014-09-09 | Disposition: A | Discharge status: Home / Self Care | Payer: Medicaid Other | Source: Ambulatory Visit | Attending: Orthopaedic Surgery | Admitting: Orthopaedic Surgery

## 2014-09-09 DIAGNOSIS — M1990 Unspecified osteoarthritis, unspecified site: Secondary | ICD-10-CM | POA: Diagnosis not present

## 2014-09-09 DIAGNOSIS — K219 Gastro-esophageal reflux disease without esophagitis: Secondary | ICD-10-CM | POA: Insufficient documentation

## 2014-09-09 DIAGNOSIS — Z87891 Personal history of nicotine dependence: Secondary | ICD-10-CM | POA: Insufficient documentation

## 2014-09-09 DIAGNOSIS — M24411 Recurrent dislocation, right shoulder: Secondary | ICD-10-CM

## 2014-09-09 DIAGNOSIS — E039 Hypothyroidism, unspecified: Secondary | ICD-10-CM | POA: Insufficient documentation

## 2014-09-09 DIAGNOSIS — S43491A Other sprain of right shoulder joint, initial encounter: Secondary | ICD-10-CM | POA: Insufficient documentation

## 2014-09-09 DIAGNOSIS — X58XXXA Exposure to other specified factors, initial encounter: Secondary | ICD-10-CM | POA: Insufficient documentation

## 2014-09-09 DIAGNOSIS — E785 Hyperlipidemia, unspecified: Secondary | ICD-10-CM | POA: Diagnosis not present

## 2014-09-09 HISTORY — DX: Major depressive disorder, single episode, unspecified: F32.9

## 2014-09-09 HISTORY — DX: Gastro-esophageal reflux disease without esophagitis: K21.9

## 2014-09-09 HISTORY — DX: Umbilical hernia without obstruction or gangrene: K42.9

## 2014-09-09 HISTORY — DX: Other intervertebral disc degeneration, lumbar region: M51.36

## 2014-09-09 HISTORY — DX: Other intervertebral disc degeneration, lumbar region without mention of lumbar back pain or lower extremity pain: M51.369

## 2014-09-09 HISTORY — DX: Unspecified dislocation of unspecified shoulder joint, initial encounter: S43.006A

## 2014-09-09 HISTORY — DX: Reserved for inherently not codable concepts without codable children: IMO0001

## 2014-09-09 HISTORY — DX: Hypothyroidism, unspecified: E03.9

## 2014-09-09 HISTORY — PX: SHOULDER ARTHROSCOPY: SHX128

## 2014-09-09 HISTORY — DX: Depression, unspecified: F32.A

## 2014-09-09 HISTORY — DX: Anesthesia of skin: R20.0

## 2014-09-09 HISTORY — DX: Anxiety disorder, unspecified: F41.9

## 2014-09-09 LAB — PREGNANCY, URINE: Preg Test, Ur: NEGATIVE

## 2014-09-09 LAB — CBC
HCT: 39.5 % (ref 36.0–46.0)
Hemoglobin: 13.1 g/dL (ref 12.0–15.0)
MCH: 29.5 pg (ref 26.0–34.0)
MCHC: 33.2 g/dL (ref 30.0–36.0)
MCV: 89 fL (ref 78.0–100.0)
Platelets: 230 10*3/uL (ref 150–400)
RBC: 4.44 MIL/uL (ref 3.87–5.11)
RDW: 15.3 % (ref 11.5–15.5)
WBC: 6.7 10*3/uL (ref 4.0–10.5)

## 2014-09-09 SURGERY — ARTHROSCOPY, SHOULDER
Anesthesia: Regional | Site: Shoulder | Laterality: Right

## 2014-09-09 MED ORDER — GLYCOPYRROLATE 0.2 MG/ML IJ SOLN
INTRAMUSCULAR | Status: DC | PRN
  Administered 2014-09-09: 0.4 mg via INTRAVENOUS

## 2014-09-09 MED ORDER — ONDANSETRON HCL 4 MG/2ML IJ SOLN
INTRAMUSCULAR | Status: AC
  Filled 2014-09-09: qty 2

## 2014-09-09 MED ORDER — MIDAZOLAM HCL 5 MG/5ML IJ SOLN
INTRAMUSCULAR | Status: DC | PRN
  Administered 2014-09-09: 2 mg via INTRAVENOUS

## 2014-09-09 MED ORDER — BUPIVACAINE-EPINEPHRINE (PF) 0.5% -1:200000 IJ SOLN
INTRAMUSCULAR | Status: DC | PRN
  Administered 2014-09-09: 25 mL via PERINEURAL

## 2014-09-09 MED ORDER — LACTATED RINGERS IV SOLN
INTRAVENOUS | Status: DC
  Administered 2014-09-09: 13:00:00 via INTRAVENOUS

## 2014-09-09 MED ORDER — PROPOFOL 10 MG/ML IV BOLUS
INTRAVENOUS | Status: AC
  Filled 2014-09-09: qty 20

## 2014-09-09 MED ORDER — ROCURONIUM BROMIDE 100 MG/10ML IV SOLN
INTRAVENOUS | Status: DC | PRN
  Administered 2014-09-09: 40 mg via INTRAVENOUS

## 2014-09-09 MED ORDER — FENTANYL CITRATE (PF) 100 MCG/2ML IJ SOLN
INTRAMUSCULAR | Status: AC
  Administered 2014-09-09: 50 ug
  Filled 2014-09-09: qty 2

## 2014-09-09 MED ORDER — LIDOCAINE HCL (CARDIAC) 20 MG/ML IV SOLN
INTRAVENOUS | Status: AC
  Filled 2014-09-09: qty 10

## 2014-09-09 MED ORDER — SODIUM CHLORIDE 0.9 % IR SOLN
Status: DC | PRN
  Administered 2014-09-09 (×3): 3000 mL

## 2014-09-09 MED ORDER — PROPOFOL 10 MG/ML IV BOLUS
INTRAVENOUS | Status: DC | PRN
  Administered 2014-09-09: 150 mg via INTRAVENOUS

## 2014-09-09 MED ORDER — ARTIFICIAL TEARS OP OINT
TOPICAL_OINTMENT | OPHTHALMIC | Status: AC
  Filled 2014-09-09: qty 3.5

## 2014-09-09 MED ORDER — NEOSTIGMINE METHYLSULFATE 10 MG/10ML IV SOLN
INTRAVENOUS | Status: DC | PRN
  Administered 2014-09-09: 4 mg via INTRAVENOUS

## 2014-09-09 MED ORDER — OXYCODONE-ACETAMINOPHEN 5-325 MG PO TABS: 1.0000 | ORAL_TABLET | ORAL | Status: DC | PRN

## 2014-09-09 MED ORDER — MIDAZOLAM HCL 2 MG/2ML IJ SOLN
INTRAMUSCULAR | Status: AC
  Filled 2014-09-09: qty 2

## 2014-09-09 MED ORDER — MIDAZOLAM HCL 2 MG/2ML IJ SOLN
INTRAMUSCULAR | Status: AC
  Administered 2014-09-09: 1 mg
  Filled 2014-09-09: qty 2

## 2014-09-09 MED ORDER — FENTANYL CITRATE (PF) 250 MCG/5ML IJ SOLN
INTRAMUSCULAR | Status: AC
  Filled 2014-09-09: qty 5

## 2014-09-09 MED ORDER — LIDOCAINE HCL (CARDIAC) 20 MG/ML IV SOLN
INTRAVENOUS | Status: DC | PRN
  Administered 2014-09-09: 60 mg via INTRATRACHEAL
  Administered 2014-09-09: 20 mg via INTRAVENOUS

## 2014-09-09 MED ORDER — ONDANSETRON HCL 4 MG/2ML IJ SOLN
INTRAMUSCULAR | Status: DC | PRN
  Administered 2014-09-09: 4 mg via INTRAVENOUS

## 2014-09-09 MED ORDER — FENTANYL CITRATE (PF) 100 MCG/2ML IJ SOLN
INTRAMUSCULAR | Status: DC | PRN
  Administered 2014-09-09: 50 ug via INTRAVENOUS
  Administered 2014-09-09: 100 ug via INTRAVENOUS

## 2014-09-09 SURGICAL SUPPLY — 55 items
ANCHOR SUT BIOCOMP LK 2.9X12.5 (Anchor) ×6 IMPLANT
BLADE CUDA 4.2 (BLADE) ×4 IMPLANT
BLADE SURG 11 STRL SS (BLADE) ×2 IMPLANT
BLADE SURG ROTATE 9660 (MISCELLANEOUS) IMPLANT
BUR VERTEX HOODED 4.5 (BURR) IMPLANT
CANNULA 5.75X7 CRYSTAL CLEAR (CANNULA) ×2 IMPLANT
CANNULA SHOULDER 7CM (CANNULA) ×4 IMPLANT
CANNULA TWIST IN 8.25X7CM (CANNULA) ×4 IMPLANT
COVER SURGICAL LIGHT HANDLE (MISCELLANEOUS) ×2 IMPLANT
DECANTER SPIKE VIAL GLASS SM (MISCELLANEOUS) IMPLANT
DISTRACTOR SHOULDER 3 POINT (INSTRUMENTS) ×2 IMPLANT
DRAPE IMP U-DRAPE 54X76 (DRAPES) ×2 IMPLANT
DRAPE SHOULDER BEACH CHAIR (DRAPES) ×2 IMPLANT
DRAPE SURG 17X23 STRL (DRAPES) ×2 IMPLANT
DRAPE U-SHAPE 47X51 STRL (DRAPES) ×2 IMPLANT
DRSG PAD ABDOMINAL 8X10 ST (GAUZE/BANDAGES/DRESSINGS) ×4 IMPLANT
DURAPREP 26ML APPLICATOR (WOUND CARE) ×2 IMPLANT
ELECT REM PT RETURN 9FT ADLT (ELECTROSURGICAL)
ELECTRODE REM PT RTRN 9FT ADLT (ELECTROSURGICAL) IMPLANT
GAUZE SPONGE 4X4 12PLY STRL (GAUZE/BANDAGES/DRESSINGS) ×2 IMPLANT
GAUZE XEROFORM 1X8 LF (GAUZE/BANDAGES/DRESSINGS) ×2 IMPLANT
GLOVE BIO SURGEON STRL SZ8 (GLOVE) ×2 IMPLANT
GLOVE BIOGEL PI IND STRL 8 (GLOVE) ×1 IMPLANT
GLOVE BIOGEL PI INDICATOR 8 (GLOVE) ×1
GLOVE ORTHO TXT STRL SZ7.5 (GLOVE) ×2 IMPLANT
GOWN STRL REUS W/ TWL LRG LVL3 (GOWN DISPOSABLE) ×2 IMPLANT
GOWN STRL REUS W/ TWL XL LVL3 (GOWN DISPOSABLE) ×4 IMPLANT
GOWN STRL REUS W/TWL LRG LVL3 (GOWN DISPOSABLE) ×2
GOWN STRL REUS W/TWL XL LVL3 (GOWN DISPOSABLE) ×4
KIT BASIN OR (CUSTOM PROCEDURE TRAY) ×2 IMPLANT
KIT PUSHLOCK 2.9 HIP (KITS) ×2 IMPLANT
KIT ROOM TURNOVER OR (KITS) ×2 IMPLANT
KIT SHOULDER TRACTION (DRAPES) ×2 IMPLANT
LASSO 90 CVE QUICKPAS (DISPOSABLE) ×2 IMPLANT
MANIFOLD NEPTUNE II (INSTRUMENTS) ×2 IMPLANT
NEEDLE SCORPION (NEEDLE) IMPLANT
NEEDLE SPNL 18GX3.5 QUINCKE PK (NEEDLE) ×2 IMPLANT
NS IRRIG 1000ML POUR BTL (IV SOLUTION) ×2 IMPLANT
PACK SHOULDER (CUSTOM PROCEDURE TRAY) ×2 IMPLANT
PACK UNIVERSAL I (CUSTOM PROCEDURE TRAY) ×2 IMPLANT
PAD ABD 8X10 STRL (GAUZE/BANDAGES/DRESSINGS) ×2 IMPLANT
PAD ARMBOARD 7.5X6 YLW CONV (MISCELLANEOUS) ×4 IMPLANT
SET ARTHROSCOPY TUBING (MISCELLANEOUS) ×1
SET ARTHROSCOPY TUBING LN (MISCELLANEOUS) ×1 IMPLANT
SLING ARM FOAM STRAP LRG (SOFTGOODS) ×2 IMPLANT
SPONGE GAUZE 4X4 12PLY STER LF (GAUZE/BANDAGES/DRESSINGS) ×2 IMPLANT
SPONGE LAP 4X18 X RAY DECT (DISPOSABLE) IMPLANT
STAPLER VISISTAT 35W (STAPLE) IMPLANT
STRIP CLOSURE SKIN 1/2X4 (GAUZE/BANDAGES/DRESSINGS) IMPLANT
SUT ETHILON 3 0 PS 1 (SUTURE) ×2 IMPLANT
TAPE LABRALWHITE 1.5X36 (TAPE) ×6 IMPLANT
TOWEL OR 17X24 6PK STRL BLUE (TOWEL DISPOSABLE) ×2 IMPLANT
TOWEL OR 17X26 10 PK STRL BLUE (TOWEL DISPOSABLE) ×2 IMPLANT
WAND HAND CNTRL MULTIVAC 90 (MISCELLANEOUS) ×2 IMPLANT
WATER STERILE IRR 1000ML POUR (IV SOLUTION) ×2 IMPLANT

## 2014-09-09 NOTE — Anesthesia Procedure Notes (Addendum)
Anesthesia Regional Block:  Interscalene brachial plexus block  Pre-Anesthetic Checklist: ,, timeout performed, Correct Patient, Correct Site, Correct Laterality, Correct Procedure, Correct Position, site marked, Risks and benefits discussed,  Surgical consent,  Pre-op evaluation,  At surgeon's request and post-op pain management  Laterality: Right  Prep: chloraprep       Needles:  Injection technique: Single-shot  Needle Type: Echogenic Stimulator Needle     Needle Length: 9cm 9 cm Needle Gauge: 21 and 21 G    Additional Needles:  Procedures: ultrasound guided (picture in chart) and nerve stimulator Interscalene brachial plexus block  Nerve Stimulator or Paresthesia:  Response: deltoid, 0.48 mA,   Additional Responses:   Narrative:  Start time: 09/09/2014 1:50 PM End time: 09/09/2014 2:02 PM Injection made incrementally with aspirations every 5 mL.  Performed by: Personally  Anesthesiologist: Suzette Battiest  Additional Notes: Risks and benefits discussed. Pt tolerated well with no immediate complications.   Procedure Name: Intubation Date/Time: 09/09/2014 4:02 PM Performed by: Maryland Pink Pre-anesthesia Checklist: Patient identified, Emergency Drugs available, Suction available, Patient being monitored and Timeout performed Patient Re-evaluated:Patient Re-evaluated prior to inductionOxygen Delivery Method: Circle system utilized Preoxygenation: Pre-oxygenation with 100% oxygen Intubation Type: IV induction Ventilation: Mask ventilation without difficulty Laryngoscope Size: Mac and 3 Grade View: Grade I Tube type: Oral Tube size: 7.0 mm Number of attempts: 1 Airway Equipment and Method: Stylet and LTA kit utilized Placement Confirmation: ETT inserted through vocal cords under direct vision,  positive ETCO2 and breath sounds checked- equal and bilateral Secured at: 20 cm Tube secured with: Tape Dental Injury: Teeth and Oropharynx as per pre-operative  assessment

## 2014-09-09 NOTE — Progress Notes (Signed)
Right eye of patient prior to going into surgery has become "irritated" looking.  Pt denies any pain or itching.  She said her daughter was showing her a video and she had been laughing.  She had, a short time after receiving the shoulder block wanted to scratch her eye, but it was the left.   DA

## 2014-09-09 NOTE — Discharge Instructions (Signed)
Do not reach behind yourself or attempt any overhead motion or reaching for the next 6 weeks. You must sleep in your sling. You can remove your sling to shower daily; then place band-aids over your incisions and go back in your sling.

## 2014-09-09 NOTE — Brief Op Note (Signed)
09/09/2014  5:29 PM  PATIENT:  Karen Nixon  44 y.o. female  PRE-OPERATIVE DIAGNOSIS:  recurrent dislocations right shoulder  POST-OPERATIVE DIAGNOSIS:  recurrent dislocations right shoulder  PROCEDURE:  Procedure(s): RIGHT SHOULDER ARTHROSCOPY WITH LABRAL REPAIR (Right)  SURGEON:  Surgeon(s) and Role:    * Mcarthur Rossetti, MD - Primary  PHYSICIAN ASSISTANT: Benita Stabile, PA-C  ANESTHESIA:   regional and general  EBL:   minimal  BLOOD ADMINISTERED:none  DRAINS: none   LOCAL MEDICATIONS USED:  NONE  SPECIMEN:  No Specimen  DISPOSITION OF SPECIMEN:  N/A  COUNTS:  YES  TOURNIQUET:  * No tourniquets in log *  DICTATION: .Other Dictation: Dictation Number [id number]  PLAN OF CARE: Discharge to home after PACU  PATIENT DISPOSITION:  PACU - hemodynamically stable.   Delay start of Pharmacological VTE agent (>24hrs) due to surgical blood loss or risk of bleeding: not applicable

## 2014-09-09 NOTE — H&P (Signed)
Karen Nixon is an 44 y.o. female.   Chief Complaint:   Right shoulder with recurrent dislocations HPI:   44 yo female with a history of multiple anterior dislocations of her right shoulder with the most recent dislocation on 07/29/14.  She had previous shoulder surgery in 2001 by someone else, but since has continued to have right shoulder instability.  At this point, surgery is being recommended to assess and possibly repair her labrum and tighten her shoulder.  Past Medical History  Diagnosis Date  . Thyroid disease   . Hyperlipemia   . Degenerative disc disease, lumbar   . Scoliosis   . Hypothyroidism   . DDD (degenerative disc disease), lumbar   . Shortness of breath dyspnea     with exertation  . Left facial numbness 2010    and tingling  and left arm- years ago.  Dr says it was stress  . Anxiety   . Depression     due to chronic pain  . GERD (gastroesophageal reflux disease)   . Umbilical hernia   . Dislocation of shoulder     x 9    Past Surgical History  Procedure Laterality Date  . Shoulder surgery Right 2001    arthroscopy  . Elbow surgery Right 2007    fracture repair with screws  . Tubal ligation  11/20/1996  . Cervical conization w/bx    . Induced abortion    . Back surgery  2005    Family History  Problem Relation Age of Onset  . Cancer Mother   . Hypertension Brother    Social History:  reports that she has quit smoking. She has never used smokeless tobacco. She reports that she drinks alcohol. She reports that she does not use illicit drugs.  Allergies:  Allergies  Allergen Reactions  . Iohexol      Code: HIVES, Desc: Pt developed hives on neck, face and back with itching. Given 50mg  oral benadryl., Onset Date: [id number]   . Naproxen Sodium     REACTION: swelling per pt    Medications Prior to Admission  Medication Sig Dispense Refill  . Ca Phosphate-Cholecalciferol (CALCIUM/VITAMIN D3 GUMMIES) 200-200 MG-UNIT CHEW Chew by mouth.    . co-enzyme  Q-10 30 MG capsule Take 200 mg by mouth daily.    . cyclobenzaprine (FLEXERIL) 5 MG tablet Take 5 mg by mouth 3 times/day as needed-between meals & bedtime for muscle spasms.    . Morphine-Naltrexone (EMBEDA) 20-0.8 MG CPCR Take 1 tablet by mouth daily as needed.    . traMADol (ULTRAM) 50 MG tablet Take 50 mg by mouth 2 (two) times daily as needed.    . Biotin w/ Vitamins C & E (HAIR SKIN & NAILS GUMMIES PO) Take 2 tablets by mouth daily.    . buprenorphine (BUTRANS - DOSED MCG/HR) 5 MCG/HR PTWK patch Place 5 mcg onto the skin See admin instructions. Weekly as needed for pain    . calcium carbonate (TUMS - DOSED IN MG ELEMENTAL CALCIUM) 500 MG chewable tablet Chew 3 tablets by mouth daily as needed for indigestion or heartburn.    . levothyroxine (SYNTHROID, LEVOTHROID) 125 MCG tablet Take 125 mcg by mouth daily before breakfast.    . lovastatin (MEVACOR) 20 MG tablet Take 20 mg by mouth at bedtime.    . Multiple Vitamin (MULTIVITAMIN WITH MINERALS) TABS tablet Take 1 tablet by mouth daily.    . psyllium (HYDROCIL/METAMUCIL) 95 % PACK Take 1 packet by mouth 2 (two) times  daily as needed for mild constipation.    . Tapentadol HCl 100 MG TABS Take 50-100 mg by mouth 4 (four) times daily as needed (pain).     Marland Kitchen tetrahydrozoline-zinc (VISINE-AC) 0.05-0.25 % ophthalmic solution Place 1 drop into both eyes 3 (three) times daily as needed (dry eyes).      No results found for this or any previous visit (from the past 48 hour(s)). No results found.  Review of Systems  All other systems reviewed and are negative.   Height 5\' 4"  (1.626 m), weight 91.627 kg (202 lb), last menstrual period 08/21/2014. Physical Exam  Constitutional: She is oriented to person, place, and time. She appears well-developed and well-nourished.  HENT:  Head: Normocephalic and atraumatic.  Eyes: EOM are normal. Pupils are equal, round, and reactive to light.  Neck: Normal range of motion. Neck supple.  Cardiovascular:  Normal rate and regular rhythm.   Respiratory: Effort normal and breath sounds normal.  GI: Soft. Bowel sounds are normal.  Musculoskeletal:       Right shoulder: She exhibits decreased range of motion, swelling and decreased strength.  Neurological: She is alert and oriented to person, place, and time.  Skin: Skin is warm and dry.  Psychiatric: She has a normal mood and affect.    Positive apprehension sign right shoulder  Assessment/Plan Recurrent right shoulder dislocations 1)  To the OR today for a right shoulder arthroscopy and hopefully a labral repair and tightening.  Mcarthur Rossetti 09/09/2014, 12:18 PM

## 2014-09-09 NOTE — Anesthesia Preprocedure Evaluation (Signed)
Anesthesia Evaluation  Patient identified by MRN, date of birth, ID band Patient awake    Reviewed: Allergy & Precautions, NPO status , Patient's Chart, lab work & pertinent test results  Airway Mallampati: II  TM Distance: >3 FB Neck ROM: Full    Dental   Pulmonary former smoker,  breath sounds clear to auscultation        Cardiovascular negative cardio ROS  Rhythm:Regular Rate:Normal     Neuro/Psych Anxiety Depression negative neurological ROS     GI/Hepatic Neg liver ROS, GERD-  ,  Endo/Other  Hypothyroidism Morbid obesity  Renal/GU negative Renal ROS     Musculoskeletal  (+) Arthritis -,   Abdominal   Peds  Hematology negative hematology ROS (+)   Anesthesia Other Findings   Reproductive/Obstetrics                             Anesthesia Physical Anesthesia Plan  ASA: III  Anesthesia Plan: General and Regional   Post-op Pain Management:    Induction: Intravenous  Airway Management Planned: Oral ETT  Additional Equipment:   Intra-op Plan:   Post-operative Plan: Extubation in OR  Informed Consent: I have reviewed the patients History and Physical, chart, labs and discussed the procedure including the risks, benefits and alternatives for the proposed anesthesia with the patient or authorized representative who has indicated his/her understanding and acceptance.   Dental advisory given  Plan Discussed with: CRNA  Anesthesia Plan Comments:         Anesthesia Quick Evaluation

## 2014-09-09 NOTE — Transfer of Care (Signed)
Immediate Anesthesia Transfer of Care Note  Patient: Karen Nixon  Procedure(s) Performed: Procedure(s): RIGHT SHOULDER ARTHROSCOPY WITH LABRAL REPAIR (Right)  Patient Location: PACU  Anesthesia Type:General and Regional  Level of Consciousness: awake, alert , oriented and patient cooperative  Airway & Oxygen Therapy: Patient Spontanous Breathing and Patient connected to nasal cannula oxygen  Post-op Assessment: Report given to RN, Post -op Vital signs reviewed and stable, Patient moving all extremities and Patient moving all extremities X 4  Post vital signs: Reviewed and stable  Last Vitals:  Filed Vitals:   09/09/14 1415  BP: 136/83  Pulse: 91  Temp:   Resp: 16    Complications: No apparent anesthesia complications

## 2014-09-10 ENCOUNTER — Encounter (HOSPITAL_COMMUNITY): Payer: Self-pay | Admitting: Orthopaedic Surgery

## 2014-09-10 NOTE — Anesthesia Postprocedure Evaluation (Signed)
  Anesthesia Post-op Note  Patient: Karen Nixon  Procedure(s) Performed: Procedure(s): RIGHT SHOULDER ARTHROSCOPY WITH LABRAL REPAIR (Right)  Patient Location: PACU  Anesthesia Type:General  Level of Consciousness: awake, alert , oriented and patient cooperative  Airway and Oxygen Therapy: Patient Spontanous Breathing  Post-op Pain: mild  Post-op Assessment: Post-op Vital signs reviewed, Patient's Cardiovascular Status Stable, Respiratory Function Stable, Patent Airway, No signs of Nausea or vomiting and Pain level controlled              Post-op Vital Signs: stable  Last Vitals:  Filed Vitals:   09/09/14 1916  BP:   Pulse:   Temp: 36.7 C  Resp:     Complications: No apparent anesthesia complications

## 2014-09-10 NOTE — Op Note (Signed)
NAMEIVET, GUERRIERI NO.:  [id number]  MEDICAL RECORD NO.:  [id number]  LOCATION:  [facility]                         FACILITY:  [facility]  PHYSICIAN:  Lind Guest. 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  PROCEDURE:  Right arthroscopy with anterior labral repair.  SURGEON:  Lind Guest. Ninfa Linden, M.D.  ASSISTANT:  Erskine Emery, P.A.  ANESTHESIA: 1. Right regional shoulder block. 2. General.  BLOOD LOSS:  Minimal.  COMPLICATIONS:  None.  INDICATIONS:  Ms. Kuk is a 44 year old female who has had a history of a chronic right shoulder dislocations.  Apparently, many years ago, she underwent a right shoulder arthroscopic procedure and this did not work.  She continued to have dislocations and MRI was obtained in 2014, it showed a question with a [place]ord complex but this is more likely anterior labral tear.  She then had the most recent dislocation in May of this year which showed it to be another anterior shoulder dislocation.  Due to my instability on exam and the fact that she keeps currently dislocating, we recommended arthroscopic labral repair with trying to advance tissue that is really tight in her shoulder up.  She fully understands the risks and benefits of surgery and the reason to proceed with this and the major risk that she still can end up with dislocations depending on the tissue quality.  PROCEDURE DESCRIPTION:  After informed consent was obtained, appropriate right shoulder was marked.  Anesthesia obtained regional in shoulder block.  She was then brought to the operating room, placed supine on the operating table.  General  anesthesia was obtained.  She was then turned into a lateral decubitus position with the right operative arm up.  An axillary roll in place and padding all around her head, left arm, and knees and feet.  A bean bag was also used.  Her right shoulder was then prepped and draped with DuraPrep and sterile drapes and placed in an Arthrex shoulder abduction device with 10 pounds of traction.  Time-out was called.  Again, she was identified as the correct patient, correct right shoulder.  I then made a posterior arthroscopy portal and entered the glenohumeral joint off the posterolateral edge of the acromion.  We were able to probe the labrum and found it to have significantly chronic degenerative tissue.  The subscapularis was intact as well as the inferior glenohumeral ligament and the rotator cuff was intact.  I then made 2 separate anterior portals for suture management.  We debrided the anterior edge of the glenoid and freed up as much tissue as we could. We then placed 3 separate FiberTape sutures through the labrum and advanced the labral tissue from 4 to 5 o'clock position up anteriorly to the 1 o'clock position to try with 3 anchors to tighten the labrum up. It did appear from just the intraoperative pictures that we were able to advance her tissue.  We  then removed all instrumentation and  closed the portal sites with interrupted nylon suture and placed her in a shoulder sling.  She was awakened, extubated, and taken to recovery room in stable condition.  All final counts were correct.  There were no complications noted.  Of note, Benita Stabile, PA-C's assistance was crucial for facilitating this case.  Postoperatively, we are going to keep her from externally rotating and abducting her shoulder as well as reaching behind her for the next 4-6 weeks to allow things to tighten up.     Lind Guest. Ninfa Linden, M.D.     CYB/MEDQ  D:  09/09/2014  T:  09/10/2014  Job:  [id number]

## 2014-09-20 ENCOUNTER — Encounter (HOSPITAL_COMMUNITY): Payer: Self-pay | Admitting: Emergency Medicine

## 2014-09-20 ENCOUNTER — Emergency Department (HOSPITAL_COMMUNITY)
Admission: EM | Admit: 2014-09-20 | Discharge: 2014-09-20 | Disposition: A | Discharge status: Home / Self Care | Payer: Medicaid Other | Attending: Emergency Medicine | Admitting: Emergency Medicine

## 2014-09-20 DIAGNOSIS — Z79899 Other long term (current) drug therapy: Secondary | ICD-10-CM | POA: Diagnosis not present

## 2014-09-20 DIAGNOSIS — R109 Unspecified abdominal pain: Secondary | ICD-10-CM | POA: Diagnosis not present

## 2014-09-20 DIAGNOSIS — G8929 Other chronic pain: Secondary | ICD-10-CM | POA: Insufficient documentation

## 2014-09-20 DIAGNOSIS — R112 Nausea with vomiting, unspecified: Secondary | ICD-10-CM | POA: Diagnosis present

## 2014-09-20 DIAGNOSIS — K219 Gastro-esophageal reflux disease without esophagitis: Secondary | ICD-10-CM | POA: Insufficient documentation

## 2014-09-20 DIAGNOSIS — E039 Hypothyroidism, unspecified: Secondary | ICD-10-CM | POA: Diagnosis not present

## 2014-09-20 DIAGNOSIS — R519 Headache, unspecified: Secondary | ICD-10-CM

## 2014-09-20 DIAGNOSIS — Z8659 Personal history of other mental and behavioral disorders: Secondary | ICD-10-CM | POA: Insufficient documentation

## 2014-09-20 DIAGNOSIS — R51 Headache: Secondary | ICD-10-CM | POA: Insufficient documentation

## 2014-09-20 DIAGNOSIS — Z87828 Personal history of other (healed) physical injury and trauma: Secondary | ICD-10-CM | POA: Diagnosis not present

## 2014-09-20 DIAGNOSIS — Z87891 Personal history of nicotine dependence: Secondary | ICD-10-CM | POA: Diagnosis not present

## 2014-09-20 DIAGNOSIS — Z9851 Tubal ligation status: Secondary | ICD-10-CM | POA: Diagnosis not present

## 2014-09-20 DIAGNOSIS — Z8739 Personal history of other diseases of the musculoskeletal system and connective tissue: Secondary | ICD-10-CM | POA: Diagnosis not present

## 2014-09-20 LAB — COMPREHENSIVE METABOLIC PANEL
ALT: 19 U/L (ref 14–54)
AST: 17 U/L (ref 15–41)
Albumin: 3.8 g/dL (ref 3.5–5.0)
Alkaline Phosphatase: 71 U/L (ref 38–126)
Anion gap: 5 (ref 5–15)
BUN: 11 mg/dL (ref 6–20)
CO2: 25 mmol/L (ref 22–32)
Calcium: 8.6 mg/dL — ABNORMAL LOW (ref 8.9–10.3)
Chloride: 113 mmol/L — ABNORMAL HIGH (ref 101–111)
Creatinine, Ser: 0.89 mg/dL (ref 0.44–1.00)
GFR calc Af Amer: >60 mL/min (ref >60)
GFR calc non Af Amer: >60 mL/min (ref >60)
Glucose, Bld: 109 mg/dL — ABNORMAL HIGH (ref 65–99)
Potassium: 4.1 mmol/L (ref 3.5–5.1)
Sodium: 143 mmol/L (ref 135–145)
Total Bilirubin: 0.7 mg/dL (ref 0.3–1.2)
Total Protein: 7 g/dL (ref 6.5–8.1)

## 2014-09-20 LAB — CBC WITH DIFFERENTIAL/PLATELET
Basophils Absolute: 0 10*3/uL (ref 0.0–0.1)
Basophils Relative: 0 % (ref 0–1)
Eosinophils Absolute: 0 10*3/uL (ref 0.0–0.7)
Eosinophils Relative: 0 % (ref 0–5)
HCT: 38.9 % (ref 36.0–46.0)
Hemoglobin: 12.6 g/dL (ref 12.0–15.0)
Lymphocytes Relative: 12 % (ref 12–46)
Lymphs Abs: 1.1 10*3/uL (ref 0.7–4.0)
MCH: 29.2 pg (ref 26.0–34.0)
MCHC: 32.4 g/dL (ref 30.0–36.0)
MCV: 90.3 fL (ref 78.0–100.0)
Monocytes Absolute: 0.6 10*3/uL (ref 0.1–1.0)
Monocytes Relative: 7 % (ref 3–12)
Neutro Abs: 7.7 10*3/uL (ref 1.7–7.7)
Neutrophils Relative %: 81 % — ABNORMAL HIGH (ref 43–77)
Platelets: 223 10*3/uL (ref 150–400)
RBC: 4.31 MIL/uL (ref 3.87–5.11)
RDW: 14.6 % (ref 11.5–15.5)
WBC: 9.5 10*3/uL (ref 4.0–10.5)

## 2014-09-20 MED ORDER — PROMETHAZINE HCL 25 MG/ML IJ SOLN
25.0000 mg | Freq: Once | INTRAMUSCULAR | Status: AC
  Administered 2014-09-20: 25 mg via INTRAVENOUS
  Filled 2014-09-20: qty 1

## 2014-09-20 MED ORDER — ONDANSETRON HCL 4 MG/2ML IJ SOLN
4.0000 mg | Freq: Once | INTRAMUSCULAR | Status: AC
  Administered 2014-09-20: 4 mg via INTRAVENOUS
  Filled 2014-09-20: qty 2

## 2014-09-20 MED ORDER — MORPHINE SULFATE 4 MG/ML IJ SOLN
4.0000 mg | Freq: Once | INTRAMUSCULAR | Status: AC
  Administered 2014-09-20: 4 mg via INTRAVENOUS
  Filled 2014-09-20: qty 1

## 2014-09-20 MED ORDER — SODIUM CHLORIDE 0.9 % IV BOLUS (SEPSIS)
500.0000 mL | Freq: Once | INTRAVENOUS | Status: AC
  Administered 2014-09-20: 500 mL via INTRAVENOUS

## 2014-09-20 MED ORDER — SODIUM CHLORIDE 0.9 % IV BOLUS (SEPSIS): 1000.0000 mL | Freq: Once | INTRAVENOUS | Status: DC

## 2014-09-20 MED ORDER — LORAZEPAM 2 MG/ML IJ SOLN: 1.0000 mg | Freq: Once | INTRAMUSCULAR | Status: DC

## 2014-09-20 MED ORDER — ONDANSETRON 4 MG PO TBDP: ORAL_TABLET | ORAL | Status: DC

## 2014-09-20 MED ORDER — FENTANYL CITRATE (PF) 100 MCG/2ML IJ SOLN
50.0000 ug | Freq: Once | INTRAMUSCULAR | Status: AC
  Administered 2014-09-20: 50 ug via INTRAVENOUS
  Filled 2014-09-20: qty 2

## 2014-09-20 NOTE — ED Notes (Signed)
Daughter's phone number: [phone]

## 2014-09-20 NOTE — ED Provider Notes (Signed)
CSN: [id number]     Arrival date & time 09/20/14  1821 History   First MD Initiated Contact with Patient 09/20/14 1832     Chief Complaint  Patient presents with  . Emesis     (Consider location/radiation/quality/duration/timing/severity/associated sxs/prior Treatment) HPI Karen Nixon is a 44 y.o. female is here for evaluation of nausea, vomiting, headache and abdominal discomfort. Patient states she is very upset that she just attended the funeral of her cousin  that was murdered last week. Family at bedside reports the funeral was originally held outside, patient became very hot, sweaty and had an upset stomach. Therefore finger was then moved inside her patient had increasing upset stomach with nausea and vomiting. She reports associated headache. Describes the headache as a right-sided sharp pain that is localized behind her eyes and radiates into her cheek. She assures her symptoms to "being worked up and very upset". She denies any fevers, chills, numbness or weakness, chest pain, shortness of breath. No other aggravating or modifying factors.  Past Medical History  Diagnosis Date  . Thyroid disease   . Hyperlipemia   . Degenerative disc disease, lumbar   . Scoliosis   . Hypothyroidism   . DDD (degenerative disc disease), lumbar   . Shortness of breath dyspnea     with exertation  . Left facial numbness 2010    and tingling  and left arm- years ago.  Dr says it was stress  . Anxiety   . Depression     due to chronic pain  . GERD (gastroesophageal reflux disease)   . Umbilical hernia   . Dislocation of shoulder     x 9   Past Surgical History  Procedure Laterality Date  . Shoulder surgery Right 2001    arthroscopy  . Elbow surgery Right 2007    fracture repair with screws  . Tubal ligation  11/20/1996  . Cervical conization w/bx    . Induced abortion    . Back surgery  2005  . Shoulder arthroscopy Right 09/09/2014    Procedure: RIGHT SHOULDER ARTHROSCOPY WITH LABRAL  REPAIR;  Surgeon: Mcarthur Rossetti, MD;  Location: [facility];  Service: Orthopedics;  Laterality: Right;   Family History  Problem Relation Age of Onset  . Cancer Mother   . Hypertension Brother    History  Substance Use Topics  . Smoking status: Former Research scientist (life sciences)  . Smokeless tobacco: Never Used     Comment: quit years ago - maybe 2002  . Alcohol Use: Yes     Comment: occasionally a couple of beers   OB History    Gravida Para Term Preterm AB TAB SAB Ectopic Multiple Living   3 2 2  1     2      Review of Systems A 10 point review of systems was completed and was negative except for pertinent positives and negatives as mentioned in the history of present illness     Allergies  Iohexol and Naproxen sodium  Home Medications   Prior to Admission medications   Medication Sig Start Date End Date Taking? Authorizing Provider  Biotin w/ Vitamins C & E (HAIR SKIN & NAILS GUMMIES PO) Take 2 tablets by mouth daily.   Yes Historical Provider, MD  Ca Phosphate-Cholecalciferol (CALCIUM/VITAMIN D3 GUMMIES) 200-200 MG-UNIT CHEW Chew by mouth.   Yes Historical Provider, MD  calcium carbonate (TUMS - DOSED IN MG ELEMENTAL CALCIUM) 500 MG chewable tablet Chew 3 tablets by mouth daily as needed for indigestion or  heartburn.   Yes Historical Provider, MD  co-enzyme Q-10 30 MG capsule Take 200 mg by mouth daily.   Yes Historical Provider, MD  levothyroxine (SYNTHROID, LEVOTHROID) 125 MCG tablet Take 125 mcg by mouth daily before breakfast.   Yes Historical Provider, MD  lovastatin (MEVACOR) 20 MG tablet Take 20 mg by mouth at bedtime.   Yes Historical Provider, MD  Multiple Vitamin (MULTIVITAMIN WITH MINERALS) TABS tablet Take 1 tablet by mouth daily.   Yes Historical Provider, MD  Tapentadol HCl 100 MG TABS Take 50-100 mg by mouth 4 (four) times daily as needed (pain).    Yes Historical Provider, MD  tetrahydrozoline-zinc (VISINE-AC) 0.05-0.25 % ophthalmic solution Place 1 drop into both eyes 3  (three) times daily as needed (dry eyes).   Yes Historical Provider, MD  ondansetron (ZOFRAN ODT) 4 MG disintegrating tablet 4mg  ODT q4 hours prn nausea/vomit 09/20/14   Comer Locket, PA-C  oxyCODONE-acetaminophen (ROXICET) 5-325 MG per tablet Take 1-2 tablets by mouth every 4 (four) hours as needed. Patient not taking: Reported on 09/20/2014 09/09/14   Mcarthur Rossetti, MD   BP 129/76 mmHg  Pulse 89  Temp(Src) 98.3 F (36.8 C) (Oral)  Resp 18  SpO2 98%  LMP 08/21/2014 Physical Exam  Constitutional: She is oriented to person, place, and time. She appears well-developed and well-nourished.  HENT:  Head: Normocephalic and atraumatic.  Mouth/Throat: Oropharynx is clear and moist.  Eyes: Conjunctivae are normal. Pupils are equal, round, and reactive to light. Right eye exhibits no discharge. Left eye exhibits no discharge. No scleral icterus.  Neck: Neck supple.  Cardiovascular: Normal rate, regular rhythm and normal heart sounds.   Pulmonary/Chest: Effort normal and breath sounds normal. No respiratory distress. She has no wheezes. She has no rales.  Abdominal: Soft. There is no tenderness.  Musculoskeletal: She exhibits no tenderness.  Neurological: She is alert and oriented to person, place, and time.  Cranial Nerves II-XII grossly intact. Moves all extremities without ataxia. Speech is fluent and appropriate.  Skin: Skin is warm and dry. No rash noted.  Psychiatric: She has a normal mood and affect.  Nursing note and vitals reviewed.   ED Course  Procedures (including critical care time) Labs Review Labs Reviewed  CBC WITH DIFFERENTIAL/PLATELET - Abnormal; Notable for the following:    Neutrophils Relative % 81 (*)    All other components within normal limits  COMPREHENSIVE METABOLIC PANEL - Abnormal; Notable for the following:    Chloride 113 (*)    Glucose, Bld 109 (*)    Calcium 8.6 (*)    All other components within normal limits    Imaging Review No results  found.   EKG Interpretation None     Meds given in ED:  Medications  sodium chloride 0.9 % bolus 500 mL (0 mLs Intravenous Stopped 09/20/14 2002)  ondansetron (ZOFRAN) injection 4 mg (4 mg Intravenous Given 09/20/14 1847)  fentaNYL (SUBLIMAZE) injection 50 mcg (50 mcg Intravenous Given 09/20/14 1847)  morphine 4 MG/ML injection 4 mg (4 mg Intravenous Given 09/20/14 2002)  promethazine (PHENERGAN) injection 25 mg (25 mg Intravenous Given 09/20/14 2002)    Discharge Medication List as of 09/20/2014 10:14 PM    START taking these medications   Details  ondansetron (ZOFRAN ODT) 4 MG disintegrating tablet 4mg  ODT q4 hours prn nausea/vomit, Print       Filed Vitals:   09/20/14 1832 09/20/14 2000 09/20/14 2123  BP: 142/97 127/87 129/76  Pulse: 91 91 89  Temp:  98.4 F (36.9 C)  98.3 F (36.8 C)  TempSrc: Oral  Oral  Resp: 18  18  SpO2: 100% 97% 98%    MDM  Vitals stable - WNL -afebrile Pt resting comfortably in ED. no vomiting in the ED. Patient reports he feels much better after antiemetics and analgesia. Headache has resolved. PE--physical exam is not concerning. Benign abdominal exam. Normal neuro exam.  Labwork--labs are noncontributory.  DDX--patient's symptoms likely secondary to grief process. Low suspicion for any acute or emergent pathology. Doubt infectious process. Doubt central lesion or other vascular compromise. Will DC with Zofran for any nausea at home. Patient discharged home in care of family members. I discussed all relevant lab findings and imaging results with pt and they verbalized understanding. Discussed f/u with PCP within 48 hrs and return precautions, pt very amenable to plan.  Final diagnoses:  Abdominal discomfort  Nonintractable headache, unspecified chronicity pattern, unspecified headache type        Comer Locket, PA-C 09/21/14 0101  Pamella Pert, MD 09/21/14 1530

## 2014-09-20 NOTE — ED Notes (Signed)
Bed: WA01 Expected date: 09/20/14 Expected time:  Means of arrival:  Comments: EMS-N/V, VSS

## 2014-09-20 NOTE — ED Notes (Signed)
Call [facility] [phone] when pt is to be d/c

## 2014-09-20 NOTE — ED Notes (Signed)
Pt family member called. PA will discontinue orders for meds

## 2014-09-20 NOTE — Discharge Instructions (Signed)
Please follow-up with primary care for further evaluation and management of your symptoms. There does not appear to be an emergent cause her symptoms today in the ED. Please take your medications as needed for nausea. Return to ED for new or worsening symptoms.

## 2014-09-20 NOTE — ED Notes (Signed)
Per ems pt from home co emesis al day wih severe headache, denies diarhhea or fever. Hx migraines. Pt received 800 cc NS and 8 mg Zofran Iv total. Per ems pt has no neuro deficit nor weakness.

## 2014-10-23 ENCOUNTER — Ambulatory Visit: Payer: Medicaid Other | Attending: Internal Medicine | Admitting: Physical Therapy

## 2014-10-23 DIAGNOSIS — M25611 Stiffness of right shoulder, not elsewhere classified: Secondary | ICD-10-CM

## 2014-10-23 DIAGNOSIS — R293 Abnormal posture: Secondary | ICD-10-CM | POA: Diagnosis present

## 2014-10-23 DIAGNOSIS — M25511 Pain in right shoulder: Secondary | ICD-10-CM | POA: Diagnosis present

## 2014-10-23 DIAGNOSIS — R609 Edema, unspecified: Secondary | ICD-10-CM

## 2014-10-23 DIAGNOSIS — R6889 Other general symptoms and signs: Secondary | ICD-10-CM

## 2014-10-23 NOTE — Therapy (Addendum)
[place], [place], [address] Phone: [phone]   Fax:  [phone]  Physical Therapy Evaluation  Patient Details  Name: Karen Nixon MRN: [id number] Date of Birth: 21-Mar-1970 Referring Provider:  Nolene Ebbs, MD  Encounter Date: 10/23/2014      PT End of Session - 10/23/14 1014    Visit Number 1   Number of Visits 4   Date for PT Re-Evaluation 12/18/14   Authorization Type Medicaid   Authorization Time Period 12-18-14   PT Start Time 0800   PT Stop Time 0900   PT Time Calculation (min) 60 min   Activity Tolerance Patient tolerated treatment well   Behavior During Therapy [facility] for tasks assessed/performed      Past Medical History  Diagnosis Date  . Thyroid disease   . Hyperlipemia   . Degenerative disc disease, lumbar   . Scoliosis   . Hypothyroidism   . DDD (degenerative disc disease), lumbar   . Shortness of breath dyspnea     with exertation  . Left facial numbness 2010    and tingling  and left arm- years ago.  Dr says it was stress  . Anxiety   . Depression     due to chronic pain  . GERD (gastroesophageal reflux disease)   . Umbilical hernia   . Dislocation of shoulder     x 9    Past Surgical History  Procedure Laterality Date  . Shoulder surgery Right 2001    arthroscopy  . Elbow surgery Right 2007    fracture repair with screws  . Tubal ligation  11/20/1996  . Cervical conization w/bx    . Induced abortion    . Back surgery  2005  . Shoulder arthroscopy Right 09/09/2014    Procedure: RIGHT SHOULDER ARTHROSCOPY WITH LABRAL REPAIR;  Surgeon: Mcarthur Rossetti, MD;  Location: [facility];  Service: Orthopedics;  Laterality: Right;    There were no vitals filed for this visit.  Visit Diagnosis:  Pain in right shoulder  Decreased ROM of right shoulder  Abnormal posture  Edema  Activity intolerance      Subjective Assessment - 10/23/14 0808    Subjective I have  difficulty sleeping. cant lie on right side wearing sling at night   Pertinent History Had shoulder 2001 labral repair with Dr. Theda Sers,  Currently going to pain management for back pain   Limitations Lifting;House hold activities   How long can you sit comfortably? unlimited   How long can you stand comfortably? unlimited   How long can you walk comfortably? unlimited   Diagnostic tests MRI   Patient Stated Goals Be able to sleep.  maximize ROM  do ADL's and household mop and sweep floor and a good night sleep   Currently in Pain? Yes   Pain Score 6    Pain Location Shoulder   Pain Orientation Right   Pain Descriptors / Indicators Stabbing;Aching   Pain Type Acute pain   Pain Onset More than a month ago   Pain Frequency Constant   Aggravating Factors  lying down at night ,  doing household chores   Pain Relieving Factors nothing.            [facility] PT Assessment - 10/23/14 0809    Assessment   Medical Diagnosis R shoulder arthroscopy with labral repair   Onset Date/Surgical Date 09/09/14   Hand Dominance Right   Prior Therapy surgery/PT in 2001   Precautions  Precautions Shoulder   Type of Shoulder Precautions flex/abd to 90 degrees, IR to tolerance ER to 30 degrees by week 6   Shoulder Interventions Shoulder sling/immobilizer   Precaution Comments Must wear sling at night but not during the day, no lifting, no abdnormal stretching   Restrictions   Weight Bearing Restrictions No   Balance Screen   Has the patient fallen in the past 6 months No   Has the patient had a decrease in activity level because of a fear of falling?  No   Is the patient reluctant to leave their home because of a fear of falling?  No   Home Environment   Living Environment Private residence   Living Arrangements Children   Type of [place] to enter   Entrance Stairs-Number of Steps 3   Entrance Stairs-Rails None   Home Layout Two level   Prior Function   Level of  Independence Independent   Vocation Unemployed   Leisure Go to church    Cognition   Overall Cognitive Status Within Functional Limits for tasks assessed   Observation/Other Assessments   Quick DASH  75    Posture/Postural Control   Posture/Postural Control Postural limitations   Postural Limitations Rounded Shoulders;Forward head;Increased lumbar lordosis   AROM   Right Shoulder Extension 16 Degrees   Right Shoulder Flexion 70 Degrees   Right Shoulder ABduction 60 Degrees   Right Shoulder Internal Rotation 55 Degrees  neutral   Right Shoulder External Rotation 30 Degrees  neutral   Right Shoulder Horizontal ABduction 90 Degrees   Right Shoulder Horizontal  ADduction -10 Degrees   Left Shoulder Extension 30 Degrees   Left Shoulder Flexion 162 Degrees   Left Shoulder ABduction 160 Degrees   Left Shoulder Internal Rotation 50 Degrees   Left Shoulder External Rotation 90 Degrees   Left Shoulder Horizontal ABduction 90 Degrees   Left Shoulder Horizontal ADduction 50 Degrees   PROM   Right Shoulder Flexion 90 Degrees   Right Shoulder ABduction 90 Degrees   Right Shoulder Internal Rotation 0 Degrees   Right Shoulder External Rotation 30 Degrees   Strength   Overall Strength Deficits   Overall Strength Comments at least 3-/5 in all planes   Right Hand Grip (lbs) 25, 30, 30   Left Hand Grip (lbs) 40, 42,42   Palpation   Palpation comment tenderness over anterior shoulder  well healed surgical arthroscopic sites                   [facility] Adult PT Treatment/Exercise - 10/23/14 0809    Shoulder Exercises: Seated   Other Seated Exercises elbow AROM with support for elbow shoulder on table. elbow flex/ext, gentle shoulder flexion with table support to tolerance each x 10 , also finger grip with ball, putty x 10    Shoulder Exercises: Standing   Other Standing Exercises pendulum circles and medial lateral, flex/   Other Standing Exercises shoulder/scapular squeezes x 10    Cryotherapy   Number Minutes Cryotherapy 15 Minutes   Cryotherapy Location Shoulder   right   Type of Cryotherapy Ice pack   Electrical Stimulation   Electrical Stimulation Location right shoulder   Electrical Stimulation Action IFC   Electrical Stimulation Parameters to pt tolearnce  15 min   Electrical Stimulation Goals Pain                PT Education - 10/23/14 1013    Education provided Yes  Education Details Explanation of findings. use of cryotherapy for pain, initial HEP for 6 weeks post labral repair/arthroscopy   Person(s) Educated Patient   Methods Explanation;Demonstration;Tactile cues;Verbal cues;Handout   Comprehension Verbalized understanding;Returned demonstration          PT Short Term Goals - 10/23/14 1020    PT SHORT TERM GOAL #1   Title Pt will be independent with initial HEP   Baseline Pt has no knowledge of exercise for condition   Time 4   Period Weeks   Status New   PT SHORT TERM GOAL #2   Title Pt pain will be reduced from 6/10 to 3/10 with all functional activity   Baseline Pt  with 6/10 in clinic   Time 4   Period Weeks   Status New   PT SHORT TERM GOAL #3   Title Pt will be able to lie on Right side to sleelp for 4 or more uninterrupted hours of sleep a night   Baseline Pt unable to lie of Right side to sleep at night with sling   Time 4   Period Weeks   Status New   PT SHORT TERM GOAL #4   Title "Demonstrate and verbalize understanding of condition management including RICE, positioning, HEP.    Baseline limited knowledge   Time 4   Period Weeks   Status New           PT Long Term Goals - 10/23/14 1054    PT LONG TERM GOAL #1   Title "Pt will be independent with advanced HEP.    Baseline Pt with no knowledge of exercise post surgery   Time 8   Period Weeks   Status New   PT LONG TERM GOAL #2   Title "Pain will decrease to 1/10 with all functional activities   Baseline  6/10 at evaluation   Time 8   Period Weeks    Status New   PT LONG TERM GOAL #3   Title DASH  will improve from  75% limitation  to  at least 20% limitation   indicating improved functional mobility .    Baseline Dash initial 75   Time 8   Period Weeks   Status New   PT LONG TERM GOAL #4   Title "R shoulder FIR and FER will return to [facility] to return to pain-free ADLs such as dressing and grooming   Baseline Internal rotation IR in neutral 50,  ER limite to 30 degrees in neutral   Time 8   Period Weeks   Status New   PT LONG TERM GOAL #5   Title "R shoulder AROM scaption will improve to 0-150 degrees for improved overhead reaching.    Baseline AROM of Right shoulder 70 flex and 60 abd   Time 8   Period Weeks   Status New               Plan - 10/23/14 0803    Clinical Impression Statement Pt presents post arthroscopy for anterior labral repair on 09-09-14 for chronic instability of R shoulder CPT (code [id number]).  Pt is now post 6 weeks on protocol. Pt  Presents with 6/10 pain, limited ROM, weakness and limited ADL's and functional activities.quick DASH 75. Pt would benefit from skilled outpatient PT  to progress toward pain free level of function using anterior labral protocol   Pt will benefit from skilled therapeutic intervention in order to improve on the following deficits Decreased strength;Pain;Improper body mechanics;Postural dysfunction;Increased  fascial restricitons;Decreased range of motion   Rehab Potential Good   PT Frequency 1x / week   PT Duration Other (comment)   for 3 visits over 8 weeks due to Medicaid restrictions   PT Treatment/Interventions ADLs/Self Care Home Management;Cryotherapy;Electrical Stimulation;Iontophoresis 4mg /ml Dexamethasone;Moist Heat;Therapeutic exercise;Therapeutic activities;Ultrasound;Neuromuscular re-education;Patient/family education;Taping;Dry needling;Passive range of motion   PT Next Visit Plan progress as on protocol   PT Home Exercise Plan pendulum, elbow supported AROM, shoulder  squeeze, hand grip strength.  AAROM flex with table to 90 flex and abd   Consulted and Agree with Plan of Care Patient         Problem List Patient Active Problem List   Diagnosis Date Noted  . 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 BREAST MASS, LEFT 09/26/2007  . RHINITIS, ALLERGIC NOS 10/26/2006  . [place] DISEASE 10/24/2006  . HYPOTHYROIDISM 10/24/2006  . HYPERTENSION 10/24/2006  . HX, PERSONAL, CERVICAL DYSPLASIA 10/24/2006   Voncille Lo, PT 10/23/2014 11:30 AM Phone: [phone] Fax: [phone]  By signing I understand that I am ordering/authorizing the use of Iontophoresis using 4 mg/mL of dexamethasone as a component of this plan of care. [place], [place], [address] Phone: [phone]   Fax:  [phone]    Due to changes in the [facility] Policy for Rehab as of June 1st , 2014-- this patient does not have a qualifying diagnosis that is covered.  The patient is unable topay out of pocket expenses at this time, there fore will not be seen for treatment. Pt only qualifies for 1 evaluation and 3 visits  .Voncille Lo, PT 10/23/2014 11:37 AM Phone: [phone] Fax: [phone]

## 2014-10-23 NOTE — Patient Instructions (Signed)
ROM: Pendulum (Circular)  Let right arm move in circle clockwise, then counterclockwise, by rocking body weight in circular pattern. Circle _10___ times each direction per set. Do _3___ sessions per day.  Pendulum Side to Side  Bend forward 90 at waist, leaning on table for support. Rock body from side to side and let arm swing freely. Repeat _10___ times. Do __3__ sessions per day.  Finger Flexors  Keeping right fingertips straight, press putty toward base of palm. Repeat __20__ times. Do __3__ sessions per day. Activity: Squeeze flour sifter, plastic squeeze bottles, [place] baster, juice from fruit.*  AROM: Elbow Flexion / Extension  With left hand palm up, gently bend elbow as far as possible. Then straighten arm as far as possible. Repeat __10__ times per set. Do __3__ sessions per day.  SHOULDER: Flexion On Table  Place hands on table, elbows straight. Move hips away from body. Press hands down into table. Hold _3__ seconds. _10__ reps per set, __3_ sets per day. Posture - Sitting   Sit upright, head facing forward. Try using a roll to support lower back. Keep shoulders relaxed, and avoid rounded back. Keep hips level with knees. Avoid crossing legs for long periods. Sit on sit bones not tail bone  Copyright  VHI. All rights reserved.   Cryotherapy  ICE 20 mins at most, 3 times a day following exercises.   Cryotherapy means treatment with cold. Ice or gel packs can be used to reduce both pain and swelling. Ice is the most helpful within the first 24 to 48 hours after an injury or flare-up from overusing a muscle or joint. Sprains, strains, spasms, burning pain, shooting pain, and aches can all be eased with ice. Ice can also be used when recovering from surgery. Ice is effective, has very few side effects, and is safe for most people to use. PRECAUTIONS  Ice is not a safe treatment option for people with:  Raynaud phenomenon. This is a condition affecting small blood  vessels in the extremities. Exposure to cold may cause your problems to return.  Cold hypersensitivity. There are many forms of cold hypersensitivity, including:  Cold urticaria. Red, itchy hives appear on the skin when the tissues begin to warm after being iced.  Cold erythema. This is a red, itchy rash caused by exposure to cold.  Cold hemoglobinuria. Red blood cells break down when the tissues begin to warm after being iced. The hemoglobin that carry oxygen are passed into the urine because they cannot combine with blood proteins fast enough.  Numbness or altered sensitivity in the area being iced. If you have any of the following conditions, do not use ice until you have discussed cryotherapy with your caregiver:  Heart conditions, such as arrhythmia, angina, or chronic heart disease.  High blood pressure.  Healing wounds or open skin in the area being iced.  Current infections.  Rheumatoid arthritis.  Poor circulation.  Diabetes. Ice slows the blood flow in the region it is applied. This is beneficial when trying to stop inflamed tissues from spreading irritating chemicals to surrounding tissues. However, if you expose your skin to cold temperatures for too long or without the proper protection, you can damage your skin or nerves. Watch for signs of skin damage due to cold. HOME CARE INSTRUCTIONS Follow these tips to use ice and cold packs safely.  Place a dry or damp towel between the ice and skin. A damp towel will cool the skin more quickly, so you may need  to shorten the time that the ice is used.  For a more rapid response, add gentle compression to the ice.  Ice for no more than 20 minutes at a time. The bonier the area you are icing, the less time it will take to get the benefits of ice.  Check your skin after 5 minutes to make sure there are no signs of a poor response to cold or skin damage.  Rest 20 minutes or more between uses.  Once your skin is numb, you can  end your treatment. You can test numbness by very lightly touching your skin. The touch should be so light that you do not see the skin dimple from the pressure of your fingertip. When using ice, most people will feel these normal sensations in this order: cold, burning, aching, and numbness.  Do not use ice on someone who cannot communicate their responses to pain, such as small children or people with dementia. HOW TO MAKE AN ICE PACK Ice packs are the most common way to use ice therapy. Other methods include ice massage, ice baths, and cryosprays. Muscle creams that cause a cold, tingly feeling do not offer the same benefits that ice offers and should not be used as a substitute unless recommended by your caregiver. To make an ice pack, do one of the following:  Place crushed ice or a bag of frozen vegetables in a sealable plastic bag. Squeeze out the excess air. Place this bag inside another plastic bag. Slide the bag into a pillowcase or place a damp towel between your skin and the bag.  Mix 3 parts water with 1 part rubbing alcohol. Freeze the mixture in a sealable plastic bag. When you remove the mixture from the freezer, it will be slushy. Squeeze out the excess air. Place this bag inside another plastic bag. Slide the bag into a pillowcase or place a damp towel between your skin and the bag. SEEK MEDICAL CARE IF:  You develop white spots on your skin. This may give the skin a blotchy (mottled) appearance.  Your skin turns blue or pale.  Your skin becomes waxy or hard.  Your swelling gets worse. MAKE SURE YOU:   Understand these instructions.  Will watch your condition.  Will get help right away if you are not doing well or get worse. Document Released: 10/11/2010 Document Revised: 07/01/2013 Document Reviewed: 10/11/2010 [facility] Patient Information 2015 [place], [place]. This information is not intended to replace advice given to you by your health care provider. Make sure you  discuss any questions you have with your health care provider.

## 2014-10-23 NOTE — Addendum Note (Signed)
Addended by: Dorothea Ogle on: 10/23/2014 11:41 AM   Modules accepted: Orders

## 2014-11-10 ENCOUNTER — Ambulatory Visit: Payer: Medicaid Other | Attending: Internal Medicine | Admitting: Physical Therapy

## 2014-11-10 DIAGNOSIS — R609 Edema, unspecified: Secondary | ICD-10-CM | POA: Diagnosis present

## 2014-11-10 DIAGNOSIS — R6889 Other general symptoms and signs: Secondary | ICD-10-CM

## 2014-11-10 DIAGNOSIS — M25511 Pain in right shoulder: Secondary | ICD-10-CM | POA: Diagnosis not present

## 2014-11-10 DIAGNOSIS — R293 Abnormal posture: Secondary | ICD-10-CM | POA: Diagnosis present

## 2014-11-10 DIAGNOSIS — M25611 Stiffness of right shoulder, not elsewhere classified: Secondary | ICD-10-CM

## 2014-11-10 NOTE — Patient Instructions (Addendum)
EXTENSION: Standing - Resistance Band: Stable (Active)   Stand, right arm at side. Against yellow resistance band, draw arm backward, as far as possible, keeping elbow straight. Complete 3_ sets of 10___ repetitions. Perform _1-2__ sessions per day.   Copyright  VHI. All rights reserved.  Resistive Band Rowing   With resistive band anchored in door, grasp both ends. Keeping elbows bent, pull back, squeezing shoulder blades together. Hold _3-5___ seconds. Repeat _3 X 10___ times. Do __1-2__ sessions per day.  http://[email or web address].exer.us/97   Flexion (Eccentric) - Active-Assist (Cane)   Use unaffected arm to push affected arm forward. Avoid hiking shoulder. Keep palm relaxed. Slowly lower affected arm for 3-5 seconds, increasing use of affected arm. _10__ reps per set, __2_ sets per day, __7_ days per week.   Copyright  VHI. All rights reserved  Cane Exercise: Abduction   Hold cane with right hand over end, palm-up, with other hand palm-down. Move arm out from side and up by pushing with other arm. Hold __3-5__ seconds. Repeat _10__ times. Do __1-2__ sessions per day.  http://[email or web address].exer.us/81   Copyright  VHI. All rights reserved.      Cane Exercise: Extension / Internal Rotation   Stand holding cane behind back with both hands palm-up. Slide cane up spine toward head. Hold _3-5___ seconds. Repeat __10__ times. Do _1-2___ sessions per day.  http://[email or web address].exer.us/85   Copyright  VHI. All rights reserved.  Karen Nixon Exercise: Extension   Stand holding cane behind back with both hands palm-up. Lift the cane away from body. Hold __3-5__ seconds. Repeat _10___ times. Do __1-2__ sessions per day.  http://[email or web address].exer.us/83   Copyright  VHI. All rights reserved.   IONTOPHORESIS PATIENT PRECAUTIONS & CONTRAINDICATIONS:  . Redness under one or both electrodes can occur.  This characterized by a uniform redness that usually disappears within 12 hours of treatment. . Small pinhead  size blisters may result in response to the drug.  Contact your physician if the problem persists more than 24 hours. . On rare occasions, iontophoresis therapy can result in temporary skin reactions such as rash, inflammation, irritation or burns.  The skin reactions may be the result of individual sensitivity to the ionic solution used, the condition of the skin at the start of treatment, reaction to the materials in the electrodes, allergies or sensitivity to dexamethasone, or a poor connection between the patch and your skin.  Discontinue using iontophoresis if you have any of these reactions and report to your therapist. . Remove the Patch or electrodes if you have any undue sensation of pain or burning during the treatment and report discomfort to your therapist. . Tell your Therapist if you have had known adverse reactions to the application of electrical current. . If using the Patch, the LED light will turn off when treatment is complete and the patch can be removed.  Approximate treatment time is 1-3 hours.  Remove the patch when light goes off or after 6 hours. . The Patch can be worn during normal activity, however excessive motion where the electrodes have been placed can cause poor contact between the skin and the electrode or uneven electrical current resulting in greater risk of skin irritation. Marland Kitchen Keep out of the reach of children.   . DO NOT use if you have a cardiac pacemaker or any other electrically sensitive implanted device. . DO NOT use if you have a known sensitivity to dexamethasone. . DO NOT use during Magnetic Resonance Imaging (MRI). . DO NOT use over broken  or compromised skin (e.g. sunburn, cuts, or acne) due to the increased risk of skin reaction. . DO NOT SHAVE over the area to be treated:  To establish good contact between the Patch and the skin, excessive hair may be clipped. . DO NOT place the Patch or electrodes on or over your eyes, directly over your heart, or  brain. . DO NOT reuse the Patch or electrodes as this may cause burns to occur.    Copyright  VHI. All rights reserved.  Karen Nixon, PT 11/10/2014 1:02 PM Phone: [phone] Fax: [phone]

## 2014-11-10 NOTE — Therapy (Signed)
[place], [place], [address] Phone: [phone]   Fax:  [phone]  Physical Therapy Treatment  Patient Details  Name: Karen Nixon MRN: [id number] Date of Birth: Jan 09, 1971 Referring Provider:  Nolene Ebbs, MD  Encounter Date: 11/10/2014      PT End of Session - 11/10/14 1233    Visit Number 2   Number of Visits 4   Date for PT Re-Evaluation 12/18/14   Authorization Type Medicaid   Authorization Time Period 12-18-14   PT Start Time 1230   PT Stop Time 1325   PT Time Calculation (min) 55 min   Activity Tolerance Patient tolerated treatment well   Behavior During Therapy [facility] for tasks assessed/performed      Past Medical History  Diagnosis Date  . Thyroid disease   . Hyperlipemia   . Degenerative disc disease, lumbar   . Scoliosis   . Hypothyroidism   . DDD (degenerative disc disease), lumbar   . Shortness of breath dyspnea     with exertation  . Left facial numbness 2010    and tingling  and left arm- years ago.  Dr says it was stress  . Anxiety   . Depression     due to chronic pain  . GERD (gastroesophageal reflux disease)   . Umbilical hernia   . Dislocation of shoulder     x 9    Past Surgical History  Procedure Laterality Date  . Shoulder surgery Right 2001    arthroscopy  . Elbow surgery Right 2007    fracture repair with screws  . Tubal ligation  11/20/1996  . Cervical conization w/bx    . Induced abortion    . Back surgery  2005  . Shoulder arthroscopy Right 09/09/2014    Procedure: RIGHT SHOULDER ARTHROSCOPY WITH LABRAL REPAIR;  Surgeon: Mcarthur Rossetti, MD;  Location: [place];  Service: Orthopedics;  Laterality: Right;    There were no vitals filed for this visit.  Visit Diagnosis:  Pain in right shoulder  Decreased ROM of right shoulder  Abnormal posture  Edema  Activity intolerance      Subjective Assessment - 11/10/14 1234    Subjective I havent  been doing exercises because life has gotten in the way.  Only grip strength.   Pertinent History Had shoulder 2001 labral repair with Dr. Theda Sers,  Currently going to pain management for back pain   Diagnostic tests MRI   Patient Stated Goals Be able to sleep.  maximize ROM  do ADL's and household mop and sweep floor and a good night sleep   Currently in Pain? Yes   Pain Score 7    Pain Location Shoulder   Pain Orientation Right   Pain Descriptors / Indicators Aching;Tightness   Pain Type Chronic pain   Pain Onset More than a month ago   Pain Frequency Occasional  when stretching            [facility] PT Assessment - 11/10/14 1237    AROM   Right Shoulder Extension 30 Degrees   Right Shoulder Flexion 130 Degrees   Right Shoulder ABduction 121 Degrees   Right Shoulder Internal Rotation 35 Degrees  90 degrees ABD   Right Shoulder External Rotation 30 Degrees  90 DEGREE ABD   Right Shoulder Horizontal ABduction 90 Degrees   Right Shoulder Horizontal  ADduction 20 Degrees   Left Shoulder Extension 30 Degrees   Left Shoulder Flexion 162 Degrees   Left  Shoulder ABduction 160 Degrees   Left Shoulder Internal Rotation 50 Degrees   Left Shoulder External Rotation 90 Degrees   Left Shoulder Horizontal ABduction 90 Degrees   Left Shoulder Horizontal ADduction 50 Degrees   Palpation   Palpation comment no pain over anterior sholuder ,but tenderness to soft tisue palpation over R triceps and posterior musculature                      [facility] Adult PT Treatment/Exercise - 11/10/14 1321    Shoulder Exercises: Standing   Extension Strengthening;Both;10 reps;Theraband  x2   Theraband Level (Shoulder Extension) Level 2 (Red)   Row Both;10 reps;Theraband  x2   Theraband Level (Shoulder Row) Level 2 (Red)   Other Standing Exercises  x10 each, Right flex, abd, ER and ext and IR with cane    Other Standing Exercises shoulder/scapular squeezes x 10   Iontophoresis   Type of  Iontophoresis Dexamethasone   Location R post shoulder tricep   Dose 1 cc on patch    Time To be removed after 4 hours/ information given to pt in written form   Manual Therapy   Manual Therapy Soft tissue mobilization;Myofascial release   Soft tissue mobilization R post shoulder  STW  Pt sensitive to mod pressure   Myofascial Release IASTYM tool used on posteror shoulder musculature                PT Education - 11/10/14 1311    Education provided Yes   Education Details standing cane ex, bil row, bil ext of shoulder with red t band , iontophoresis patch on R triceps and iontophoresis handout and education   Person(s) Educated Patient   Methods Explanation;Demonstration;Verbal cues;Tactile cues;Handout   Comprehension Verbalized understanding;Returned demonstration          PT Short Term Goals - 11/10/14 1243    PT SHORT TERM GOAL #1   Title Pt will be independent with initial HEP   Baseline Pt has no knowledge of exercise for condition   Time 4   Period Weeks   Status On-going   PT SHORT TERM GOAL #2   Title Pt pain will be reduced from 6/10 to 3/10 with all functional activity   Baseline Pt with 6-7/10 pain when stretching at end range IR an ER   Time 4   Period Weeks   Status On-going   PT SHORT TERM GOAL #3   Title Pt will be able to lie on Right side to sleelp for 4 or more uninterrupted hours of sleep a night   Baseline Pt sleeping for 3 hours now   Time 4   Period Weeks   Status On-going   PT SHORT TERM GOAL #4   Title "Demonstrate and verbalize understanding of condition management including RICE, positioning, HEP.    Time 4   Period Weeks   Status Achieved           PT Long Term Goals - 11/10/14 1246    PT LONG TERM GOAL #1   Title "Pt will be independent with advanced HEP.    Baseline Pt with no knowledge of exercise post surgery   Time 8   Period Weeks   Status On-going   PT LONG TERM GOAL #2   Title "Pain will decrease to 1/10 with all  functional activities   Baseline  6/10 at evaluation   Time 8   Period Weeks   Status On-going   PT LONG  TERM GOAL #3   Title DASH  will improve from  75% limitation  to  at least 20% limitation   indicating improved functional mobility .    Baseline Dash initial 75   Time 8   Period Weeks   Status On-going   PT LONG TERM GOAL #4   Title "R shoulder FIR and FER will return to [facility] to return to pain-free ADLs such as dressing and grooming   Baseline Pt is able to utilize with  50% better ease   Time 8   Period Weeks   Status On-going   PT LONG TERM GOAL #5   Title "R shoulder AROM scaption will improve to 0-150 degrees for improved overhead reaching.    Baseline flex 0-130    Time 8   Period Weeks   Status On-going               Plan - 11/10/14 1602    Clinical Impression Statement Pt enters clinic with no pain at rest but does have soft tissue pain with palpation on R triceps and posterior shoulder.  Pt has increased AROM of R shoulder from evalution.  R shoulder flex 140, abd 121, IR at 90 degree abd is 35 and ER with 90 degree abd is 30.  Pt achieved STG of knowledge of use of ice and postioning and posture .  Pt will continue for 2 more visits as per Medicaid allowance to maximiize pain control and function.  as per protocol.    Pt will benefit from skilled therapeutic intervention in order to improve on the following deficits Decreased strength;Pain;Improper body mechanics;Postural dysfunction;Increased fascial restricitons;Decreased range of motion   Rehab Potential Good   PT Frequency 1x / week   PT Duration Other (comment)  2 additional visits remain   PT Treatment/Interventions ADLs/Self Care Home Management;Cryotherapy;Electrical Stimulation;Iontophoresis 4mg /ml Dexamethasone;Moist Heat;Therapeutic exercise;Therapeutic activities;Ultrasound;Neuromuscular re-education;Patient/family education;Taping;Dry needling;Passive range of motion   PT Next Visit Plan progress as  on protocol. anterior labral repair on 09-09-14   PT Home Exercise Plan progress strengthening as able  gave standing cane exercises and bil row and bil extension with red t band. iontophoresis for posterior shoulder pain. soft tissue   Consulted and Agree with Plan of Care Patient        Problem List Patient Active Problem List   Diagnosis Date Noted  . 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 BREAST MASS, LEFT 09/26/2007  . RHINITIS, ALLERGIC NOS 10/26/2006  . [place] DISEASE 10/24/2006  . HYPOTHYROIDISM 10/24/2006  . HYPERTENSION 10/24/2006  . HX, PERSONAL, CERVICAL DYSPLASIA 10/24/2006   Voncille Lo, PT 11/10/2014 4:09 PM Phone: [phone] Fax: [facility] [place], [place], [address] Phone: [phone]   Fax:  [phone]

## 2014-11-24 ENCOUNTER — Ambulatory Visit: Payer: Medicaid Other | Admitting: Physical Therapy

## 2014-11-24 DIAGNOSIS — M25511 Pain in right shoulder: Secondary | ICD-10-CM | POA: Diagnosis not present

## 2014-11-24 DIAGNOSIS — R293 Abnormal posture: Secondary | ICD-10-CM

## 2014-11-24 DIAGNOSIS — R609 Edema, unspecified: Secondary | ICD-10-CM

## 2014-11-24 DIAGNOSIS — M25611 Stiffness of right shoulder, not elsewhere classified: Secondary | ICD-10-CM

## 2014-11-24 DIAGNOSIS — R6889 Other general symptoms and signs: Secondary | ICD-10-CM

## 2014-11-24 NOTE — Therapy (Signed)
[place], [place], [address] Phone: [phone]   Fax:  [phone]  Physical Therapy Treatment  Patient Details  Name: Karen Nixon MRN: [id number] Date of Birth: 1970/03/25 Referring Provider:  Nolene Ebbs, MD  Encounter Date: 11/24/2014      PT End of Session - 11/24/14 1302    Visit Number 3   Number of Visits 4   Date for PT Re-Evaluation 12/18/14   Authorization Type Medicaid   Authorization Time Period 12-18-14   PT Start Time 1234   PT Stop Time 1320   PT Time Calculation (min) 46 min   Activity Tolerance Patient tolerated treatment well   Behavior During Therapy [facility] for tasks assessed/performed      Past Medical History  Diagnosis Date  . Thyroid disease   . Hyperlipemia   . Degenerative disc disease, lumbar   . Scoliosis   . Hypothyroidism   . DDD (degenerative disc disease), lumbar   . Shortness of breath dyspnea     with exertation  . Left facial numbness 2010    and tingling  and left arm- years ago.  Dr says it was stress  . Anxiety   . Depression     due to chronic pain  . GERD (gastroesophageal reflux disease)   . Umbilical hernia   . Dislocation of shoulder     x 9    Past Surgical History  Procedure Laterality Date  . Shoulder surgery Right 2001    arthroscopy  . Elbow surgery Right 2007    fracture repair with screws  . Tubal ligation  11/20/1996  . Cervical conization w/bx    . Induced abortion    . Back surgery  2005  . Shoulder arthroscopy Right 09/09/2014    Procedure: RIGHT SHOULDER ARTHROSCOPY WITH LABRAL REPAIR;  Surgeon: Mcarthur Rossetti, MD;  Location: [place];  Service: Orthopedics;  Laterality: Right;    There were no vitals filed for this visit.  Visit Diagnosis:  Pain in right shoulder  Decreased ROM of right shoulder  Abnormal posture  Edema  Activity intolerance      Subjective Assessment - 11/24/14 1239    Subjective i notice  I am a little achy due to the weather.  My shoulder is doing so much better.  My back starts hurting more.   Limitations Lifting;House hold activities   Diagnostic tests MRI   Patient Stated Goals Be able to sleep.  maximize ROM  do ADL's and household mop and sweep floor and a good night sleep   Pain Score 3    Pain Location Shoulder   Pain Orientation Right   Pain Descriptors / Indicators Aching;Tightness   Pain Type Chronic pain   Pain Onset More than a month ago   Pain Frequency Occasional   Multiple Pain Sites Yes   Pain Score 6   Pain Location Back   Pain Orientation Left   Pain Descriptors / Indicators Aching;Sore;Stabbing   Pain Type Chronic pain   Pain Onset More than a month ago   Pain Frequency Intermittent  constant when sitting up   Aggravating Factors  sitting             [facility] PT Assessment - 11/24/14 1247    AROM   Right Shoulder Extension 30 Degrees   Right Shoulder Flexion 151 Degrees   Right Shoulder ABduction 145 Degrees   Right Shoulder Internal Rotation 50 Degrees  90 degrees ABD  Right Shoulder External Rotation 70 Degrees  90 DEGREE ABD   Right Shoulder Horizontal ABduction 90 Degrees   Right Shoulder Horizontal  ADduction 20 Degrees   Left Shoulder Extension 30 Degrees   Left Shoulder Flexion 162 Degrees   Left Shoulder ABduction 160 Degrees   Left Shoulder Internal Rotation 50 Degrees   Left Shoulder External Rotation 90 Degrees   Left Shoulder Horizontal ABduction 90 Degrees   Left Shoulder Horizontal ADduction 50 Degrees   Strength   Right Hand Grip (lbs) 60,65,72 lb.   Left Hand Grip (lbs) 65.60,60   Palpation   Palpation comment no tenderness to palpation                     [facility] Adult PT Treatment/Exercise - 11/24/14 1245    Shoulder Exercises: Standing   External Rotation Strengthening;Theraband;10 reps  x 2   Theraband Level (Shoulder External Rotation) Level 2 (Red)   Extension Strengthening;Both;10  reps;Theraband  x2   Theraband Level (Shoulder Extension) Level 2 (Red)   Row Both;10 reps;Theraband  x2   Theraband Level (Shoulder Row) Level 2 (Red)   Other Standing Exercises [place], Right side flex, ext , ER, IR with red t band 2 x 10 with VC                PT Education - 11/24/14 1243    Education provided Yes   Education Details Pt given [facility].  Given progression for shoulder, [place] exercises and then bilateral rows, and bil extension.  discussed community wellness programs   Person(s) Educated Patient   Methods Explanation;Demonstration;Verbal cues;Handout   Comprehension Verbalized understanding;Returned demonstration          PT Short Term Goals - 11/24/14 1302    PT SHORT TERM GOAL #1   Title Pt will be independent with initial HEP   Time 4   Period Weeks   Status Achieved   PT SHORT TERM GOAL #2   Title Pt pain will be reduced from 6/10 to 3/10 with all functional activity   Baseline 0/10   Time 4   Period Weeks   Status Achieved   PT SHORT TERM GOAL #3   Title Pt will be able to lie on Right side to sleelp for 4 or more uninterrupted hours of sleep a night   Baseline Sleeps for 4 hours but back is main reason for not sleeping well   Time 4   Period Weeks   Status Achieved   PT SHORT TERM GOAL #4   Title "Demonstrate and verbalize understanding of condition management including RICE, positioning, HEP.    Time 4   Period Weeks   Status Achieved           PT Long Term Goals - 11/24/14 1303    PT LONG TERM GOAL #1   Title "Pt will be independent with advanced HEP.    Baseline Pt with no knowledge of exercise post surgery   Time 8   Period Weeks   Status On-going   PT LONG TERM GOAL #2   Title "Pain will decrease to 1/10 with all functional activities   Baseline 0/10 today   Time 8   Period Weeks   Status Achieved   PT LONG TERM GOAL #3   Title DASH  will improve from  75% limitation  to  at least  20% limitation   indicating improved functional mobility .    Baseline Deliah Boston  initial 75   Time 8   Period Weeks   Status Unable to assess   PT LONG TERM GOAL #4   Title "R shoulder FIR and FER will return to [facility] to return to pain-free ADLs such as dressing and grooming   Baseline Pt with no pain and ADLs   Time 8   Period Weeks   Status Achieved   PT LONG TERM GOAL #5   Baseline 0 - 150 but pt still using left hand more for overhead lifting   Time 8   Period Weeks   Status On-going               Plan - 11/24/14 1307    Clinical Impression Statement Pt has completed all STG's and has pain level at 0/10 level.  Pt pain is more limited to low back and pt given information about [facility] bon[facility] clinic for back pain and PT if interested.  Also discussed communtiy wellness opportuniities and  scholarship applicaation . AROM right shoulder flex 151, abd 145, 50 IR, 70 ER and no pain on palpation.  Pt will attend  final visit allowed by Medicaid  for last progression of exercises next visit.     PT Next Visit Plan Pt needs to do DASH for last visit and progress HEP as needed   PT Home Exercise Plan [place] Exercises and bil rows, ext, ER with red t band and gtiven blue for progression and community wellness programs   Consulted and Agree with Plan of Care Patient        Problem List Patient Active Problem List   Diagnosis Date Noted  . 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 BREAST MASS, LEFT 09/26/2007  . RHINITIS, ALLERGIC NOS 10/26/2006  . [place] DISEASE 10/24/2006  . HYPOTHYROIDISM 10/24/2006  . HYPERTENSION 10/24/2006  . HX, PERSONAL, CERVICAL DYSPLASIA 10/24/2006    Voncille Lo, PT 11/24/2014 1:20 PM Phone: [phone] Fax: [place] [facility] [address] [place], [place], [address] Phone: [phone]   Fax:  [phone]

## 2014-11-24 NOTE — Patient Instructions (Signed)
Strengthening: Resisted Internal Rotation   Hold tubing in left hand, elbow at side and forearm out. Rotate forearm in across body. Repeat _10___ times per set. Do __2__ sets per session. Do __1-2__ sessions per day.  http://orth.exer.us/830   Copyright  VHI. All rights reserved.  Strengthening: Resisted External Rotation   Hold tubing in right hand, elbow at side and forearm across body. Rotate forearm out. At 90 degrees.  Pay no mind to the picture Telisha!!! Do like in clinic. Repeat _10___ times per set. Do __2__ sets per session. Do 1-2____ sessions per day.  http://orth.exer.us/828   Copyright  VHI. All rights reserved.  Strengthening: Resisted Flexion   Hold tubing with left arm at side. Pull forward and up. Move shoulder through pain-free range of motion. Repeat __10__ times per set. Do __2__ sets per session. Do __1-2__ sessions per day.  http://orth.exer.us/824   Copyright  VHI. All rights reserved.  Strengthening: Resisted Extension   Hold tubing in right hand, arm forward. Pull arm back, elbow straight. Repeat __10__ times per set. Do _2___ sets per session. Do 1-2____ sessions per day.  http://orth.exer.us/832   EXTENSION: Standing - Resistance Band: Stable (Active)   Stand, right arm at side. Against yellow resistance band, draw arm backward, as far as possible, keeping elbow straight. Complete _10__ sets of ___ repetitions. Perform 1-2___ sessions per day.  Copyright  VHI. All rights reserved.  Row: Mid-Range - Standing   With yellow band anchored at chest level, pull elbows backward, squeezing shoulder blades together. Keep head and spine neutral. Row __10 x 2_ times, _1-2__ times per day.  http://ss.exer.us/290   Copyright  VHI. All rights reserved.  Resistive Band Rowing   With resistive band anchored in door, grasp both ends. Keeping elbows bent, pull back, squeezing shoulder blades together. Hold _3-5___ seconds. Repeat __10 x 2__ times.  Do 1-2____ sessions per day.  http://gt2.exer.us/97   Copyright  VHI. All rights reserved.   You have green and then blue theraband.  Progress as each level becomes easy.  Start with red.   Copyright  VHI. All rights reserved.  Voncille Lo, PT 11/24/2014 12:45 PM Phone: [phone] Fax: [phone]

## 2014-12-09 ENCOUNTER — Ambulatory Visit: Payer: Medicaid Other | Attending: Internal Medicine | Admitting: Physical Therapy

## 2014-12-09 DIAGNOSIS — M25511 Pain in right shoulder: Secondary | ICD-10-CM | POA: Diagnosis not present

## 2014-12-09 DIAGNOSIS — R293 Abnormal posture: Secondary | ICD-10-CM | POA: Diagnosis present

## 2014-12-09 DIAGNOSIS — M25611 Stiffness of right shoulder, not elsewhere classified: Secondary | ICD-10-CM

## 2014-12-09 NOTE — Therapy (Signed)
[place], [place], [address] Phone: [phone]   Fax:  [phone]  Physical Therapy Treatment/Discharge Summary  Patient Details  Name: Karen Nixon MRN: [id number] Date of Birth: 12-19-1970 Referring Provider:  Nolene Ebbs, MD  Encounter Date: 12/09/2014      PT End of Session - 12/09/14 1243    Visit Number 4   Number of Visits 4   Date for PT Re-Evaluation 12/18/14   Authorization Type Medicaid   PT Start Time 1100   PT Stop Time 1145   PT Time Calculation (min) 45 min   Activity Tolerance Patient tolerated treatment well      Past Medical History  Diagnosis Date  . Thyroid disease   . Hyperlipemia   . Degenerative disc disease, lumbar   . Scoliosis   . Hypothyroidism   . DDD (degenerative disc disease), lumbar   . Shortness of breath dyspnea     with exertation  . Left facial numbness 2010    and tingling  and left arm- years ago.  Dr says it was stress  . Anxiety   . Depression     due to chronic pain  . GERD (gastroesophageal reflux disease)   . Umbilical hernia   . Dislocation of shoulder     x 9    Past Surgical History  Procedure Laterality Date  . Shoulder surgery Right 2001    arthroscopy  . Elbow surgery Right 2007    fracture repair with screws  . Tubal ligation  11/20/1996  . Cervical conization w/bx    . Induced abortion    . Back surgery  2005  . Shoulder arthroscopy Right 09/09/2014    Procedure: RIGHT SHOULDER ARTHROSCOPY WITH LABRAL REPAIR;  Surgeon: Mcarthur Rossetti, MD;  Location: [place];  Service: Orthopedics;  Laterality: Right;    There were no vitals filed for this visit.  Visit Diagnosis:  Pain in right shoulder  Decreased ROM of right shoulder  Abnormal posture      Subjective Assessment - 12/09/14 1057    Subjective No pain today.  I am functioning well, no complaints at all.  I can get stuff out of the cabinet now, I'm not scared  now.  It feels nice and tight, not loosey goosey like I was before surgery.  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  Sleeping well and I can sleep on the right shoulder.     Currently in Pain? No/denies   Pain Score 0-No pain   Pain Location Shoulder   Pain Orientation Right   Aggravating Factors  nothing noted            [facility] PT Assessment - 12/09/14 1105    Observation/Other Assessments   Quick DASH  10   AROM   Right Shoulder Extension 30 Degrees   Right Shoulder Flexion 164 Degrees   Right Shoulder ABduction 161 Degrees   Right Shoulder Internal Rotation --  behind back T7-8   Right Shoulder External Rotation 64 Degrees   Strength   Overall Strength --  4-/5 throughout                     [facility] Adult PT Treatment/Exercise - 12/09/14 0001    Shoulder Exercises: Prone   Horizontal ABduction 1 Strengthening;Right   Shoulder Exercises: Standing   Flexion Strengthening;Right;12 reps;Weights   Shoulder Flexion Weight (lbs) 1   ABduction Strengthening;Right;15  reps;Weights   Shoulder ABduction Weight (lbs) 1   Other Standing Exercises Diagonal extensions with slow eccentric return 15x D1/D2                PT Education - 12/09/14 1242    Education provided Yes   Education Details HEP progression red band diagonals, scaption, abduction   Person(s) Educated Patient   Methods Explanation;Handout;Demonstration   Comprehension Verbalized understanding;Returned demonstration          PT Short Term Goals - 12/09/14 1250    PT SHORT TERM GOAL #1   Title Pt will be independent with initial HEP   Status Achieved   PT SHORT TERM GOAL #2   Title Pt pain will be reduced from 6/10 to 3/10 with all functional activity   Status Achieved   PT SHORT TERM GOAL #3   Title Pt will be able to lie on Right side to sleelp for 4 or more uninterrupted hours of sleep a night   Status Achieved   PT SHORT TERM GOAL #4   Title "Demonstrate and  verbalize understanding of condition management including RICE, positioning, HEP.    Status Achieved           PT Long Term Goals - 12/09/14 1251    PT LONG TERM GOAL #1   Title "Pt will be independent with advanced HEP.    Status Achieved   PT LONG TERM GOAL #2   Title "Pain will decrease to 1/10 with all functional activities   Status Achieved   PT LONG TERM GOAL #3   Title DASH  will improve from  75% limitation  to  at least 20% limitation   indicating improved functional mobility .    Status Achieved   PT LONG TERM GOAL #4   Title "R shoulder FIR and FER will return to [facility] to return to pain-free ADLs such as dressing and grooming   Status Achieved   PT LONG TERM GOAL #5   Title "R shoulder AROM scaption will improve to 0-150 degrees for improved overhead reaching.    Status Achieved               Plan - 12/09/14 1243    Clinical Impression Statement The patient reports her shoulder is doing well with minimal to no pain and a good return to function.  Her AROM is WFLs with <5-10 degrees difference between left and right.  Her strength is grossly 4-/5.  Her Quick DASH score improved dramatically from 75 to 10.  She has met all rehab goals and patient expresses readiness for discharge to an indepedent HEP.         PHYSICAL THERAPY DISCHARGE SUMMARY  Visits from Start of Care: 4  Current functional level related to goals / functional outcomes: See clinical impression above   Remaining deficits: See above   Education / Equipment: HEP Plan: Patient agrees to discharge.  Patient goals were met. Patient is being discharged due to meeting the stated rehab goals.  ?????       Ruben Im, PT 12/09/2014 12:55 PM Phone: [phone] Fax: [phone] Problem List Patient Active Problem List   Diagnosis Date Noted  . 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 BREAST MASS, LEFT 09/26/2007  . RHINITIS, ALLERGIC NOS 10/26/2006  . [place] DISEASE  10/24/2006  . HYPOTHYROIDISM 10/24/2006  . HYPERTENSION 10/24/2006  . HX, PERSONAL, CERVICAL DYSPLASIA 10/24/2006    Ruben Im C 12/09/2014, 12:54 PM  [facility] [address] [place], [place], [address] Phone: [phone]   Fax:  [phone]

## 2014-12-22 ENCOUNTER — Encounter: Payer: Medicaid Other | Admitting: Physical Therapy

## 2015-04-24 ENCOUNTER — Other Ambulatory Visit: Payer: Self-pay

## 2015-04-24 DIAGNOSIS — Z1231 Encounter for screening mammogram for malignant neoplasm of breast: Secondary | ICD-10-CM

## 2015-05-05 ENCOUNTER — Ambulatory Visit
Admission: RE | Admit: 2015-05-05 | Discharge: 2015-05-05 | Disposition: A | Discharge status: Home / Self Care | Payer: Medicaid Other | Source: Ambulatory Visit

## 2015-05-05 DIAGNOSIS — Z1231 Encounter for screening mammogram for malignant neoplasm of breast: Secondary | ICD-10-CM

## 2015-07-31 IMAGING — CR DG SHOULDER 2+V*R*
2 series · 2 of 2 positions shown · non-contrast
Comparison: Right shoulder radiographs performed 07/29/2014

CLINICAL DATA: Status post reduction of right humeral head
dislocation. Initial encounter.

EXAM:
RIGHT SHOULDER - 2+ VIEW

[shoulder grashey]
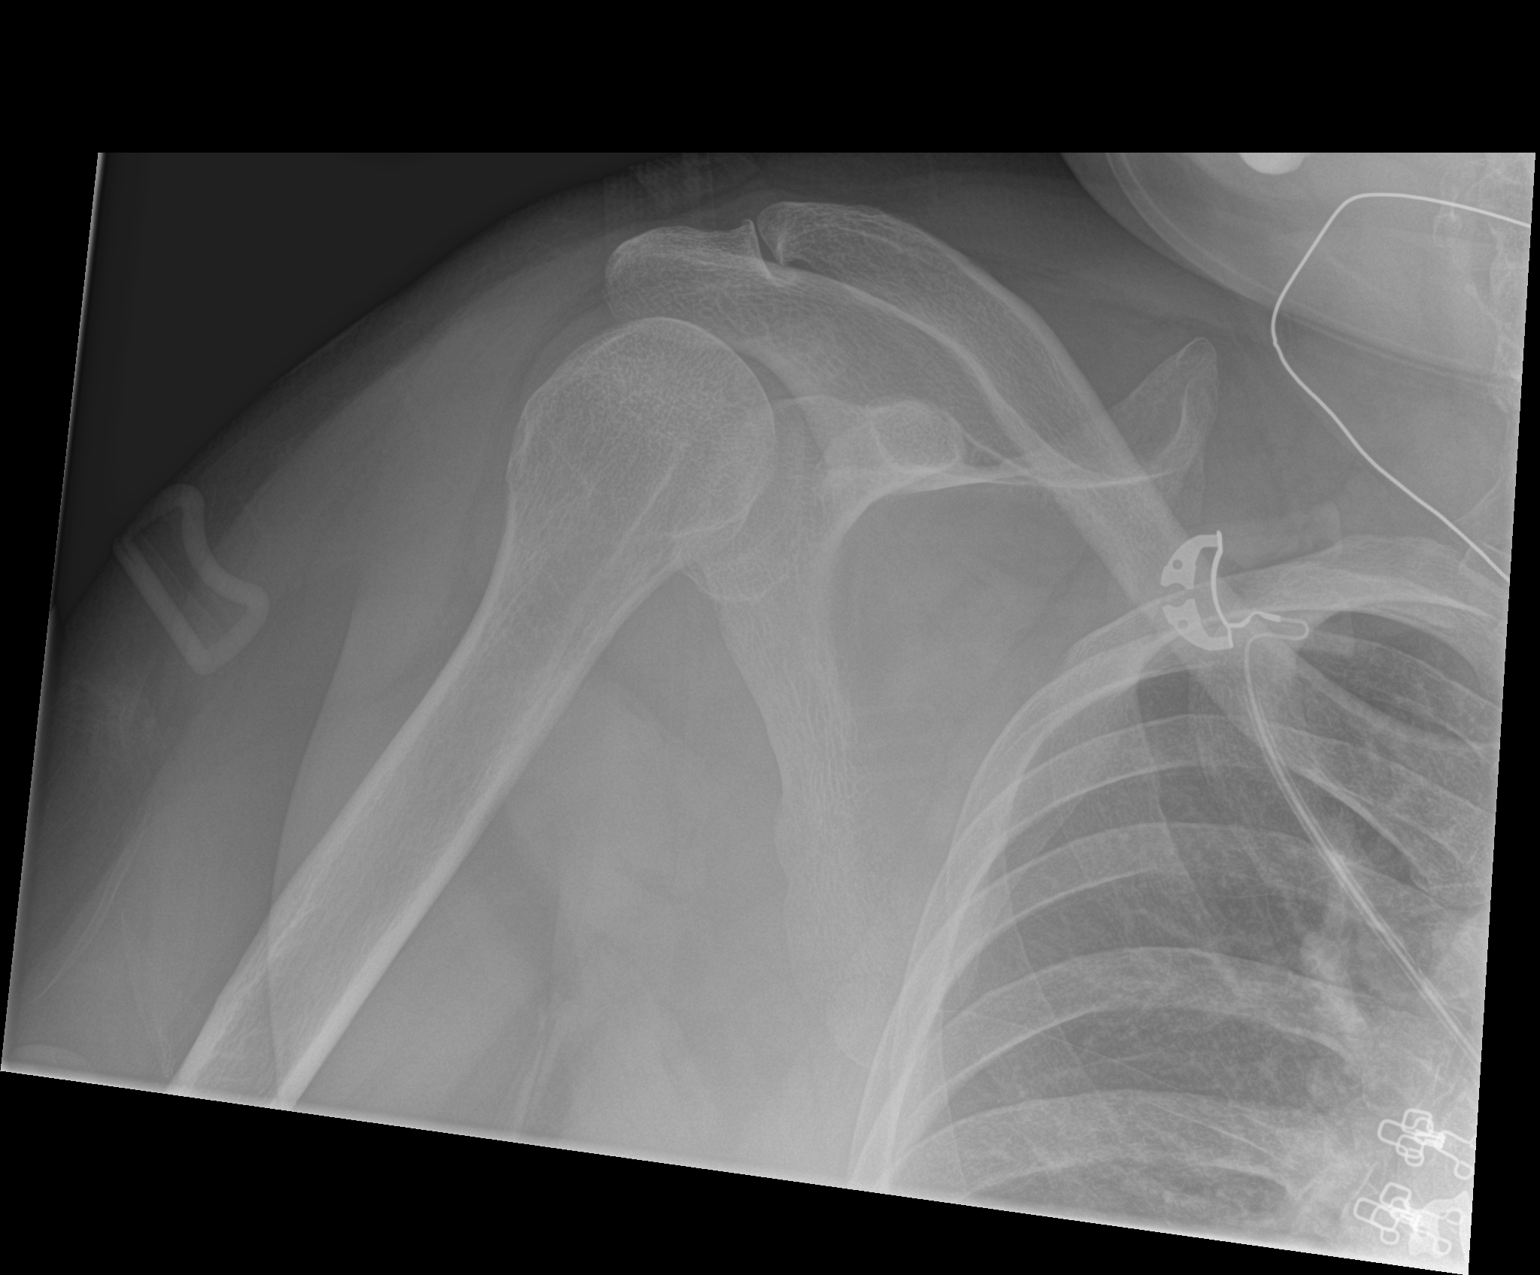

[shoulder y view]
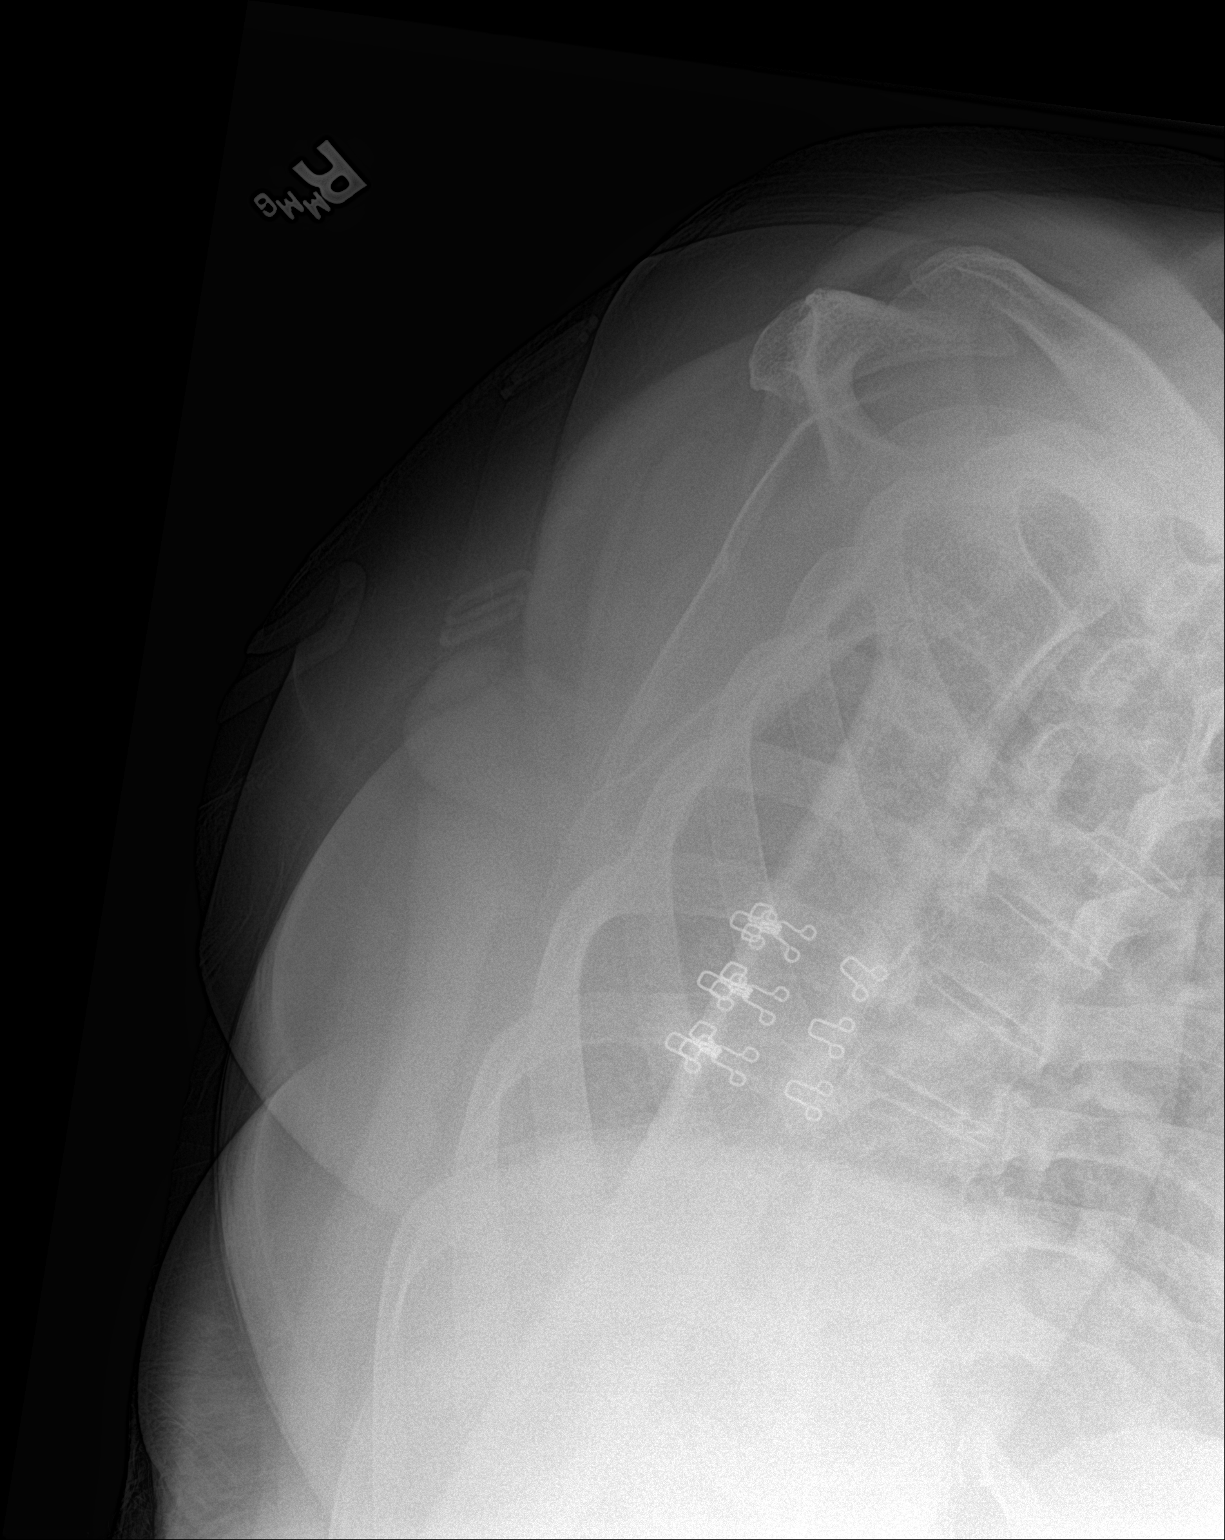

[2 of 2 positions shown; findings below may reference images not displayed]

FINDINGS: There has been successful reduction of the patient's right humeral
head dislocation. No definite Hill-Sachs or osseous Bankart lesion
is seen.

The right acromioclavicular joint is unremarkable in appearance. The
visualized portions of the right lung are clear.
IMPRESSION: Successful reduction of right humeral head dislocation. No definite
Hill-Sachs or osseous Bankart lesion seen.

## 2016-04-18 ENCOUNTER — Emergency Department (HOSPITAL_COMMUNITY)
Admission: EM | Admit: 2016-04-18 | Discharge: 2016-04-18 | Disposition: A | Discharge status: Home / Self Care | Payer: Medicaid Other | Attending: Emergency Medicine | Admitting: Emergency Medicine

## 2016-04-18 ENCOUNTER — Encounter (HOSPITAL_COMMUNITY): Payer: Self-pay | Admitting: Emergency Medicine

## 2016-04-18 ENCOUNTER — Observation Stay (HOSPITAL_COMMUNITY)
Admission: EM | Admit: 2016-04-18 | Discharge: 2016-04-19 | Disposition: A | Discharge status: Home / Self Care | Payer: Self-pay | Attending: Family Medicine | Admitting: Family Medicine

## 2016-04-18 DIAGNOSIS — T50904D Poisoning by unspecified drugs, medicaments and biological substances, undetermined, subsequent encounter: Secondary | ICD-10-CM

## 2016-04-18 DIAGNOSIS — Z888 Allergy status to other drugs, medicaments and biological substances status: Secondary | ICD-10-CM | POA: Insufficient documentation

## 2016-04-18 DIAGNOSIS — F329 Major depressive disorder, single episode, unspecified: Secondary | ICD-10-CM | POA: Insufficient documentation

## 2016-04-18 DIAGNOSIS — R0602 Shortness of breath: Secondary | ICD-10-CM | POA: Insufficient documentation

## 2016-04-18 DIAGNOSIS — Z886 Allergy status to analgesic agent status: Secondary | ICD-10-CM | POA: Insufficient documentation

## 2016-04-18 DIAGNOSIS — R0600 Dyspnea, unspecified: Secondary | ICD-10-CM | POA: Insufficient documentation

## 2016-04-18 DIAGNOSIS — R61 Generalized hyperhidrosis: Secondary | ICD-10-CM

## 2016-04-18 DIAGNOSIS — E039 Hypothyroidism, unspecified: Secondary | ICD-10-CM | POA: Insufficient documentation

## 2016-04-18 DIAGNOSIS — R55 Syncope and collapse: Secondary | ICD-10-CM | POA: Insufficient documentation

## 2016-04-18 DIAGNOSIS — T50904A Poisoning by unspecified drugs, medicaments and biological substances, undetermined, initial encounter: Secondary | ICD-10-CM

## 2016-04-18 DIAGNOSIS — J309 Allergic rhinitis, unspecified: Secondary | ICD-10-CM | POA: Insufficient documentation

## 2016-04-18 DIAGNOSIS — G8929 Other chronic pain: Secondary | ICD-10-CM | POA: Insufficient documentation

## 2016-04-18 DIAGNOSIS — W19XXXA Unspecified fall, initial encounter: Secondary | ICD-10-CM

## 2016-04-18 DIAGNOSIS — M24411 Recurrent dislocation, right shoulder: Secondary | ICD-10-CM | POA: Insufficient documentation

## 2016-04-18 DIAGNOSIS — M5136 Other intervertebral disc degeneration, lumbar region: Secondary | ICD-10-CM | POA: Insufficient documentation

## 2016-04-18 DIAGNOSIS — M545 Low back pain, unspecified: Secondary | ICD-10-CM

## 2016-04-18 DIAGNOSIS — Z809 Family history of malignant neoplasm, unspecified: Secondary | ICD-10-CM | POA: Insufficient documentation

## 2016-04-18 DIAGNOSIS — E0500 Thyrotoxicosis with diffuse goiter without thyrotoxic crisis or storm: Secondary | ICD-10-CM | POA: Insufficient documentation

## 2016-04-18 DIAGNOSIS — I10 Essential (primary) hypertension: Secondary | ICD-10-CM | POA: Insufficient documentation

## 2016-04-18 DIAGNOSIS — G894 Chronic pain syndrome: Secondary | ICD-10-CM

## 2016-04-18 DIAGNOSIS — Z8249 Family history of ischemic heart disease and other diseases of the circulatory system: Secondary | ICD-10-CM | POA: Insufficient documentation

## 2016-04-18 DIAGNOSIS — N6320 Unspecified lump in the left breast, unspecified quadrant: Secondary | ICD-10-CM | POA: Insufficient documentation

## 2016-04-18 DIAGNOSIS — F419 Anxiety disorder, unspecified: Secondary | ICD-10-CM | POA: Insufficient documentation

## 2016-04-18 DIAGNOSIS — Z87891 Personal history of nicotine dependence: Secondary | ICD-10-CM | POA: Insufficient documentation

## 2016-04-18 DIAGNOSIS — Z79899 Other long term (current) drug therapy: Secondary | ICD-10-CM | POA: Insufficient documentation

## 2016-04-18 DIAGNOSIS — M479 Spondylosis, unspecified: Secondary | ICD-10-CM | POA: Insufficient documentation

## 2016-04-18 DIAGNOSIS — T50994A Poisoning by other drugs, medicaments and biological substances, undetermined, initial encounter: Secondary | ICD-10-CM | POA: Insufficient documentation

## 2016-04-18 DIAGNOSIS — K219 Gastro-esophageal reflux disease without esophagitis: Secondary | ICD-10-CM | POA: Insufficient documentation

## 2016-04-18 DIAGNOSIS — E785 Hyperlipidemia, unspecified: Secondary | ICD-10-CM | POA: Insufficient documentation

## 2016-04-18 DIAGNOSIS — M419 Scoliosis, unspecified: Secondary | ICD-10-CM | POA: Insufficient documentation

## 2016-04-18 LAB — CBC
HCT: 41 % (ref 36.0–46.0)
Hemoglobin: 13.9 g/dL (ref 12.0–15.0)
MCH: 29.9 pg (ref 26.0–34.0)
MCHC: 33.9 g/dL (ref 30.0–36.0)
MCV: 88.2 fL (ref 78.0–100.0)
Platelets: 237 10*3/uL (ref 150–400)
RBC: 4.65 MIL/uL (ref 3.87–5.11)
RDW: 12.8 % (ref 11.5–15.5)
WBC: 9.4 10*3/uL (ref 4.0–10.5)

## 2016-04-18 LAB — BASIC METABOLIC PANEL
Anion gap: 5 (ref 5–15)
BUN: 10 mg/dL (ref 6–20)
CO2: 28 mmol/L (ref 22–32)
Calcium: 10 mg/dL (ref 8.9–10.3)
Chloride: 108 mmol/L (ref 101–111)
Creatinine, Ser: 0.95 mg/dL (ref 0.44–1.00)
GFR calc Af Amer: >60 mL/min (ref >60)
GFR calc non Af Amer: >60 mL/min (ref >60)
Glucose, Bld: 99 mg/dL (ref 65–99)
Potassium: 4.5 mmol/L (ref 3.5–5.1)
Sodium: 141 mmol/L (ref 135–145)

## 2016-04-18 LAB — CBG MONITORING, ED
Glucose-Capillary: 92 mg/dL (ref 65–99)
Glucose-Capillary: 154 mg/dL — ABNORMAL HIGH (ref 65–99)

## 2016-04-18 LAB — TSH: TSH: 0.013 u[IU]/mL — ABNORMAL LOW (ref 0.350–4.500)

## 2016-04-18 MED ORDER — IBUPROFEN 800 MG PO TABS
800.0000 mg | ORAL_TABLET | Freq: Once | ORAL | Status: AC
  Administered 2016-04-18: 800 mg via ORAL
  Filled 2016-04-18: qty 1

## 2016-04-18 MED ORDER — ONDANSETRON HCL 4 MG PO TABS: 4.0000 mg | ORAL_TABLET | Freq: Four times a day (QID) | ORAL | Status: DC | PRN

## 2016-04-18 MED ORDER — GABAPENTIN 300 MG PO CAPS
300.0000 mg | ORAL_CAPSULE | Freq: Three times a day (TID) | ORAL | Status: DC
  Administered 2016-04-19: 300 mg via ORAL
  Filled 2016-04-18: qty 1

## 2016-04-18 MED ORDER — BACLOFEN 10 MG PO TABS
10.0000 mg | ORAL_TABLET | Freq: Three times a day (TID) | ORAL | Status: DC
  Administered 2016-04-19: 10 mg via ORAL
  Filled 2016-04-18: qty 1

## 2016-04-18 MED ORDER — PRAVASTATIN SODIUM 20 MG PO TABS: 20.0000 mg | ORAL_TABLET | Freq: Every day | ORAL | Status: DC

## 2016-04-18 MED ORDER — IBUPROFEN 800 MG PO TABS: 800.0000 mg | ORAL_TABLET | Freq: Four times a day (QID) | ORAL | 0 refills | Status: DC | PRN

## 2016-04-18 MED ORDER — ONDANSETRON 4 MG PO TBDP
4.0000 mg | ORAL_TABLET | Freq: Once | ORAL | Status: AC
  Administered 2016-04-18: 4 mg via ORAL
  Filled 2016-04-18: qty 1

## 2016-04-18 MED ORDER — SODIUM CHLORIDE 0.9 % IV SOLN
INTRAVENOUS | Status: DC
  Administered 2016-04-19: 01:00:00 via INTRAVENOUS

## 2016-04-18 MED ORDER — LEVOTHYROXINE SODIUM 125 MCG PO TABS
125.0000 ug | ORAL_TABLET | Freq: Every day | ORAL | Status: DC
  Administered 2016-04-19: 125 ug via ORAL
  Filled 2016-04-18 (×2): qty 1

## 2016-04-18 MED ORDER — ACETAMINOPHEN 650 MG RE SUPP: 650.0000 mg | Freq: Four times a day (QID) | RECTAL | Status: DC | PRN

## 2016-04-18 MED ORDER — ONDANSETRON HCL 4 MG/2ML IJ SOLN: 4.0000 mg | Freq: Four times a day (QID) | INTRAMUSCULAR | Status: DC | PRN

## 2016-04-18 MED ORDER — ACETAMINOPHEN 325 MG PO TABS
650.0000 mg | ORAL_TABLET | Freq: Four times a day (QID) | ORAL | Status: DC | PRN
  Administered 2016-04-19 (×2): 650 mg via ORAL
  Filled 2016-04-18 (×2): qty 2

## 2016-04-18 MED ORDER — NALOXONE HCL 0.4 MG/ML IJ SOLN
0.4000 mg | Freq: Once | INTRAMUSCULAR | Status: AC
  Administered 2016-04-18: 0.4 mg via INTRAVENOUS
  Filled 2016-04-18: qty 1

## 2016-04-18 MED ORDER — SODIUM CHLORIDE 0.9 % IV BOLUS (SEPSIS)
1000.0000 mL | Freq: Once | INTRAVENOUS | Status: AC
  Administered 2016-04-18: 1000 mL via INTRAVENOUS

## 2016-04-18 NOTE — ED Notes (Signed)
Pt stood up but said she was in too much pain to walk

## 2016-04-18 NOTE — ED Triage Notes (Signed)
Pt had been seen, treated, and discharged for a drug overdose  Pt was sitting in lobby and had a syncopal episode  Pt brought back to [facility]

## 2016-04-18 NOTE — H&P (Addendum)
History and Physical    Karen Nixon [id number] DOB: [birth year] DOA: 04/18/2016  PCP: Carmie Kanner, NP  Patient coming from: Home.  Chief Complaint: Loss of consciousness.  HPI: Karen Nixon is a 46 y.o. female with chronic back pain, hypothyroidism and hyperlipidemia presents to the ER after patient had a syncopal episode. Patient states last afternoon around 3 PM patient had severe pain and took her Neurontin and Nucynta together which she usually does not take together. Denies overdosing. Following which she felt nauseated and went to the bathroom and was sitting on the commode when she passed out and was on the floor. She did hit her head. EMS was called and patient was brought to the ER. In the ER patient was mildly lethargic and did not respond to Narcan. After patient became alert and awake patient was planned to be discharged home when at the discharge area while getting off the wheelchair patient had another syncopal episode. Which lasted for a few seconds. Patient denies any chest pain or shortness of breath but was diaphoretic. EKG shows normal sinus rhythm with QTC of 464 ms. Denies any new medications recently. Since patient has had 2 episodes of syncope patient is being admitted for further observation.   ED Course: EKG shows normal sinus rhythm with QTC of 464 ms. Appears nonfocal.  Review of Systems: As per HPI, rest all negative.   Past Medical History:  Diagnosis Date  . Anxiety   . DDD (degenerative disc disease), lumbar   . Degenerative disc disease, lumbar   . Depression    due to chronic pain  . Dislocation of shoulder    x 9  . GERD (gastroesophageal reflux disease)   . Hyperlipemia   . Hypothyroidism   . Left facial numbness 2010   and tingling  and left arm- years ago.  Dr says it was stress  . Scoliosis   . Shortness of breath dyspnea    with exertation  . Thyroid disease   . Umbilical hernia     Past Surgical History:  Procedure Laterality Date    . BACK SURGERY  2005  . CERVICAL CONIZATION W/BX    . ELBOW SURGERY Right 2007   fracture repair with screws  . INDUCED ABORTION    . SHOULDER ARTHROSCOPY Right 09/09/2014   Procedure: RIGHT SHOULDER ARTHROSCOPY WITH LABRAL REPAIR;  Surgeon: Mcarthur Rossetti, MD;  Location: [facility];  Service: Orthopedics;  Laterality: Right;  . SHOULDER SURGERY Right 2001   arthroscopy  . TUBAL LIGATION  11/20/1996     reports that she has quit smoking. She has never used smokeless tobacco. She reports that she drinks alcohol. She reports that she does not use drugs.  Allergies  Allergen Reactions  . Aleve [Naproxen Sodium]   . Iohexol      Code: HIVES, Desc: Pt developed hives on neck, face and back with itching. Given 50mg  oral benadryl., Onset Date: [id number]   . Naproxen Sodium     REACTION: swelling per pt    Family History  Problem Relation Age of Onset  . Cancer Mother   . Hypertension Brother     Prior to Admission medications   Medication Sig Start Date End Date Taking? Authorizing Provider  baclofen (LIORESAL) 10 MG tablet Take 10 mg by mouth 3 (three) times daily.    Historical Provider, MD  Ca Phosphate-Cholecalciferol (CALCIUM/VITAMIN D3 GUMMIES) 200-200 MG-UNIT CHEW Chew 1 tablet by mouth daily.     Historical Provider, MD  calcium carbonate (TUMS - DOSED IN MG ELEMENTAL CALCIUM) 500 MG chewable tablet Chew 3 tablets by mouth daily as needed for indigestion or heartburn.    Historical Provider, MD  gabapentin (NEURONTIN) 300 MG capsule Take 300 mg by mouth 3 (three) times daily.    Historical Provider, MD  ibuprofen (ADVIL,MOTRIN) 800 MG tablet Take 1 tablet (800 mg total) by mouth every 6 (six) hours as needed for mild pain or moderate pain. 04/18/16   Leo Grosser, MD  levothyroxine (SYNTHROID, LEVOTHROID) 125 MCG tablet Take 125 mcg by mouth daily before breakfast.    Historical Provider, MD  lovastatin (MEVACOR) 20 MG tablet Take 20 mg by mouth at bedtime.    Historical  Provider, MD  ondansetron (ZOFRAN ODT) 4 MG disintegrating tablet 4mg  ODT q4 hours prn nausea/vomit 09/20/14   Comer Locket, PA-C  tapentadol (NUCYNTA) 50 MG tablet Take 50 mg by mouth every 6 (six) hours as needed for severe pain.    Historical Provider, MD  tetrahydrozoline-zinc (VISINE-AC) 0.05-0.25 % ophthalmic solution Place 1 drop into both eyes 3 (three) times daily as needed (dry eyes).    Historical Provider, MD  traZODone (DESYREL) 50 MG tablet Take 50 mg by mouth at bedtime.     Historical Provider, MD    Physical Exam: Vitals:   04/18/16 2148 04/18/16 2322  BP: 119/81   Pulse: 81   Resp: 20   Temp: 98.2 F (36.8 C)   TempSrc: Oral   SpO2: 91%   Weight:  79.4 kg (175 lb)  Height:  5\' 5"  (1.651 m)      Constitutional: Moderately built and nourished. Vitals:   04/18/16 2148 04/18/16 2322  BP: 119/81   Pulse: 81   Resp: 20   Temp: 98.2 F (36.8 C)   TempSrc: Oral   SpO2: 91%   Weight:  79.4 kg (175 lb)  Height:  5\' 5"  (1.651 m)   Eyes: Anicteric. No pallor. ENMT: No discharge from the ears eyes nose or mouth. Neck: No mass felt. No neck rigidity. Respiratory: No rhonchi or crepitations. Cardiovascular: S1 and S2 heard no murmurs appreciated. Abdomen: Soft nontender bowel sounds present. No guarding or rigidity. Musculoskeletal: No edema. No joint effusion. Skin: No rash. Skin appears warm. Neurologic: Alert awake oriented to time place and person. Moves all extremities. Psychiatric: Appears normal. Normal affect.   Labs on Admission: I have personally reviewed following labs and imaging studies  CBC:  Recent Labs Lab 04/18/16 1858  WBC 9.4  HGB 13.9  HCT 41.0  MCV 88.2  PLT 123XX123   Basic Metabolic Panel:  Recent Labs Lab 04/18/16 1858  NA 141  K 4.5  CL 108  CO2 28  GLUCOSE 99  BUN 10  CREATININE 0.95  CALCIUM 10.0   GFR: Estimated Creatinine Clearance: 76.3 mL/min (by C-G formula based on SCr of 0.95 mg/dL). Liver Function  Tests: No results for input(s): AST, ALT, ALKPHOS, BILITOT, PROT, ALBUMIN in the last 168 hours. No results for input(s): LIPASE, AMYLASE in the last 168 hours. No results for input(s): AMMONIA in the last 168 hours. Coagulation Profile: No results for input(s): INR, PROTIME in the last 168 hours. Cardiac Enzymes: No results for input(s): CKTOTAL, CKMB, CKMBINDEX, TROPONINI in the last 168 hours. BNP (last 3 results) No results for input(s): PROBNP in the last 8760 hours. HbA1C: No results for input(s): HGBA1C in the last 72 hours. CBG:  Recent Labs Lab 04/18/16 1714 04/18/16 2135  GLUCAP 92 154*  Lipid Profile: No results for input(s): CHOL, HDL, LDLCALC, TRIG, CHOLHDL, LDLDIRECT in the last 72 hours. Thyroid Function Tests:  Recent Labs  04/18/16 2154  TSH 0.013*   Anemia Panel: No results for input(s): VITAMINB12, FOLATE, FERRITIN, TIBC, IRON, RETICCTPCT in the last 72 hours. Urine analysis:    Component Value Date/Time   COLORURINE YELLOW 06/27/2014 1232   APPEARANCEUR CLOUDY (A) 06/27/2014 1232   LABSPEC 1.020 06/27/2014 1232   PHURINE 5.0 06/27/2014 1232   GLUCOSEU NEGATIVE 06/27/2014 1232   HGBUR MODERATE (A) 06/27/2014 1232   BILIRUBINUR NEGATIVE 06/27/2014 1232   KETONESUR NEGATIVE 06/27/2014 1232   PROTEINUR NEGATIVE 06/27/2014 1232   UROBILINOGEN 0.2 06/27/2014 1232   NITRITE NEGATIVE 06/27/2014 1232   LEUKOCYTESUR MODERATE (A) 06/27/2014 1232   Sepsis Labs: @LABRCNTIP (procalcitonin:4,lacticidven:4) )No results found for this or any previous visit (from the past 240 hour(s)).   Radiological Exams on Admission: No results found.  EKG: Independently reviewed. Normal sinus rhythm with QTC of 464 ms.  Assessment/Plan Principal Problem:   Syncope Active Problems:   Hypothyroidism   Chronic pain    1. Syncope - cause not clear. Patient states she has had syncope previously after taking hydrocodone. Not sure if patient had a syncopal episode due to  the pain or taking Neurontin and Nucynta together. Closely monitor in telemetry. Check 2-D echo. See #2 with regarding to patient's pain medications. CT head is pending. Since patient also was diaphoretic chest x-ray cardiac markers and UA has been ordered. 2. Chronic pain - patient is on baclofen and Neurontin which will be started tomorrow. Trazodone is on hold for now since patient initially was mildly lethargic. Nucynta can be restarted tomorrow if patient is alert and awake. Patient states she only takes 1 tablet of Nucynta  if needed daily. Denies overdosing with medications. Denies any suicidal ideation. 3. Hypothyroidism on Synthroid. 4. Hyperlipidemia on statins.   DVT prophylaxis: SCDs. CT head is pending. Code Status: Full code.  Family Communication: Discussed with patient's sister.  Disposition Plan: Home.  Consults called: None.  Admission status: Observation.    Rise Patience MD [facility] Pager [phone].  If 7PM-7AM, please contact night-coverage [email or web address] Password [facility]  04/18/2016, 11:51 PM

## 2016-04-18 NOTE — ED Provider Notes (Signed)
[facility] DEPT Provider Note   CSN: [id number] Arrival date & time: 04/18/16  2131     History   Chief Complaint No chief complaint on file.   HPI Karen Nixon is a 46 y.o. female.  The history is provided by the patient.  Loss of Consciousness   This is a recurrent problem. Episode onset: just now. The problem has been gradually improving. She lost consciousness for a period of less than one minute. The problem is associated with normal activity (sitting in wheelchair). Associated symptoms include back pain (chronic). Pertinent negatives include fever. She has tried nothing for the symptoms.    Past Medical History:  Diagnosis Date  . Anxiety   . DDD (degenerative disc disease), lumbar   . Degenerative disc disease, lumbar   . Depression    due to chronic pain  . Dislocation of shoulder    x 9  . GERD (gastroesophageal reflux disease)   . Hyperlipemia   . Hypothyroidism   . Left facial numbness 2010   and tingling  and left arm- years ago.  Dr says it was stress  . Scoliosis   . Shortness of breath dyspnea    with exertation  . Thyroid disease   . Umbilical hernia     Patient Active Problem List   Diagnosis Date Noted  . 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 BREAST MASS, LEFT 09/26/2007  . RHINITIS, ALLERGIC NOS 10/26/2006  . [place] DISEASE 10/24/2006  . HYPOTHYROIDISM 10/24/2006  . HYPERTENSION 10/24/2006  . HX, PERSONAL, CERVICAL DYSPLASIA 10/24/2006    Past Surgical History:  Procedure Laterality Date  . BACK SURGERY  2005  . CERVICAL CONIZATION W/BX    . ELBOW SURGERY Right 2007   fracture repair with screws  . INDUCED ABORTION    . SHOULDER ARTHROSCOPY Right 09/09/2014   Procedure: RIGHT SHOULDER ARTHROSCOPY WITH LABRAL REPAIR;  Surgeon: Mcarthur Rossetti, MD;  Location: [facility];  Service: Orthopedics;  Laterality: Right;  . SHOULDER SURGERY Right 2001   arthroscopy  . TUBAL LIGATION  11/20/1996    OB History    Gravida  Para Term Preterm AB Living   3 2 2   1 2    SAB TAB Ectopic Multiple Live Births                   Home Medications    Prior to Admission medications   Medication Sig Start Date End Date Taking? Authorizing Provider  baclofen (LIORESAL) 10 MG tablet Take 10 mg by mouth 3 (three) times daily.    Historical Provider, MD  Ca Phosphate-Cholecalciferol (CALCIUM/VITAMIN D3 GUMMIES) 200-200 MG-UNIT CHEW Chew 1 tablet by mouth daily.     Historical Provider, MD  calcium carbonate (TUMS - DOSED IN MG ELEMENTAL CALCIUM) 500 MG chewable tablet Chew 3 tablets by mouth daily as needed for indigestion or heartburn.    Historical Provider, MD  gabapentin (NEURONTIN) 300 MG capsule Take 300 mg by mouth 3 (three) times daily.    Historical Provider, MD  ibuprofen (ADVIL,MOTRIN) 800 MG tablet Take 1 tablet (800 mg total) by mouth every 6 (six) hours as needed for mild pain or moderate pain. 04/18/16   Leo Grosser, MD  levothyroxine (SYNTHROID, LEVOTHROID) 125 MCG tablet Take 125 mcg by mouth daily before breakfast.    Historical Provider, MD  lovastatin (MEVACOR) 20 MG tablet Take 20 mg by mouth at bedtime.    Historical Provider, MD  ondansetron (ZOFRAN ODT) 4 MG disintegrating  tablet 4mg  ODT q4 hours prn nausea/vomit 09/20/14   Comer Locket, PA-C  tapentadol (NUCYNTA) 50 MG tablet Take 50 mg by mouth every 6 (six) hours as needed for severe pain.    Historical Provider, MD  tetrahydrozoline-zinc (VISINE-AC) 0.05-0.25 % ophthalmic solution Place 1 drop into both eyes 3 (three) times daily as needed (dry eyes).    Historical Provider, MD  traZODone (DESYREL) 50 MG tablet Take 50 mg by mouth at bedtime.     Historical Provider, MD    Family History Family History  Problem Relation Age of Onset  . Cancer Mother   . Hypertension Brother     Social History Social History  Substance Use Topics  . Smoking status: Former Research scientist (life sciences)  . Smokeless tobacco: Never Used     Comment: quit years ago - maybe 2002    . Alcohol use Yes     Comment: occasionally a couple of beers     Allergies   Iohexol and Naproxen sodium   Review of Systems Review of Systems  Constitutional: Negative for fever.  Cardiovascular: Positive for syncope.  Musculoskeletal: Positive for back pain (chronic).  All other systems reviewed and are negative.    Physical Exam Updated Vital Signs There were no vitals taken for this visit.  Physical Exam  Constitutional: She is oriented to person, place, and time. She appears well-developed and well-nourished. No distress.  HENT:  Head: Normocephalic.  Nose: Nose normal.  Eyes: Conjunctivae are normal.  Neck: Neck supple. No tracheal deviation present.  Cardiovascular: Normal rate and regular rhythm.   Pulmonary/Chest: Effort normal. No respiratory distress.  Abdominal: Soft. She exhibits no distension.  Neurological: She is alert and oriented to person, place, and time. GCS eye subscore is 4. GCS verbal subscore is 5. GCS motor subscore is 6.  Skin: Skin is warm. She is diaphoretic.  Psychiatric: She has a normal mood and affect.     ED Treatments / Results  Labs (all labs ordered are listed, but only abnormal results are displayed) Labs Reviewed  TSH - Abnormal; Notable for the following:       Result Value   TSH 0.013 (*)    All other components within normal limits  AMMONIA - Abnormal; Notable for the following:    Ammonia <9 (*)    All other components within normal limits  ACETAMINOPHEN LEVEL - Abnormal; Notable for the following:    Acetaminophen (Tylenol), Serum <10 (*)    All other components within normal limits  CBG MONITORING, ED - Abnormal; Notable for the following:    Glucose-Capillary 154 (*)    All other components within normal limits  RAPID URINE DRUG SCREEN, HOSP PERFORMED  SALICYLATE LEVEL  TROPONIN I  MAGNESIUM  T4, FREE  PREGNANCY, URINE  TROPONIN I  TROPONIN I  HIV ANTIBODY (ROUTINE TESTING)  BASIC METABOLIC PANEL  CBC     EKG  EKG Interpretation None       Radiology No results found.  Procedures Procedures (including critical care time)  Medications Ordered in ED Medications  sodium chloride 0.9 % bolus 1,000 mL (not administered)  naloxone (NARCAN) injection 0.4 mg (not administered)     Initial Impression / Assessment and Plan / ED Course  I have reviewed the triage vital signs and the nursing notes.  Pertinent labs & imaging results that were available during my care of the patient were reviewed by me and considered in my medical decision making (see chart for details).  46 y.o. female presents with syncopal episode after discharge only a few minutes ago. She slumped over in the wheelchair and became unresponsive, moved back to ED for re-evaluation. AFVSS. Suspect whe will need more time to metabolize medications and wake up to prevent further vagal episodes. Labs broadened to evaluate for thyroid dysfunction. Hospitalist was consulted for admission and will see the patient in the emergency department.   Final Clinical Impressions(s) / ED Diagnoses   Final diagnoses:  Drug overdose, undetermined intent, subsequent encounter  Syncope and collapse    New Prescriptions New Prescriptions   No medications on file     Leo Grosser, MD 04/19/16 0140

## 2016-04-18 NOTE — ED Triage Notes (Signed)
Per EMS pt from home , unintentional overdose on her meds. Per EMS pt doubled her daily dose on Trazedone, newcynta, baclofen, gabapenten around 3 pm. Alert and oriented x 4. Denis SI , sts took these meds to feel better. Had syncopal episode in bathroom.

## 2016-04-18 NOTE — ED Notes (Signed)
Bed: [facility] Expected date:  Expected time:  Means of arrival:  Comments: EMS-OD

## 2016-04-18 NOTE — ED Provider Notes (Signed)
[facility] DEPT Provider Note   CSN: [id number] Arrival date & time: 04/18/16  1643     History   Chief Complaint Chief Complaint  Patient presents with  . Drug Overdose    HPI Karen Nixon is a 46 y.o. female.  The history is provided by the patient.  Loss of Consciousness   This is a new problem. The current episode started 1 to 2 hours ago. The problem occurs constantly. The problem has been resolved. She lost consciousness for a period of less than one minute. Associated with: feeling nauseated on the toilet trying to throw up into a trash can. Pertinent negatives include bladder incontinence, bowel incontinence and fever. Associated symptoms comments: Diarrhea yesterday 10x. Treatments tried: immodium. The treatment provided no relief.    Past Medical History:  Diagnosis Date  . Anxiety   . DDD (degenerative disc disease), lumbar   . Degenerative disc disease, lumbar   . Depression    due to chronic pain  . Dislocation of shoulder    x 9  . GERD (gastroesophageal reflux disease)   . Hyperlipemia   . Hypothyroidism   . Left facial numbness 2010   and tingling  and left arm- years ago.  Dr says it was stress  . Scoliosis   . Shortness of breath dyspnea    with exertation  . Thyroid disease   . Umbilical hernia     Patient Active Problem List   Diagnosis Date Noted  . 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 BREAST MASS, LEFT 09/26/2007  . RHINITIS, ALLERGIC NOS 10/26/2006  . [place] DISEASE 10/24/2006  . HYPOTHYROIDISM 10/24/2006  . HYPERTENSION 10/24/2006  . HX, PERSONAL, CERVICAL DYSPLASIA 10/24/2006    Past Surgical History:  Procedure Laterality Date  . BACK SURGERY  2005  . CERVICAL CONIZATION W/BX    . ELBOW SURGERY Right 2007   fracture repair with screws  . INDUCED ABORTION    . SHOULDER ARTHROSCOPY Right 09/09/2014   Procedure: RIGHT SHOULDER ARTHROSCOPY WITH LABRAL REPAIR;  Surgeon: Mcarthur Rossetti, MD;  Location: [place] [facility];  Service: Orthopedics;  Laterality: Right;  . SHOULDER SURGERY Right 2001   arthroscopy  . TUBAL LIGATION  11/20/1996    OB History    Gravida Para Term Preterm AB Living   3 2 2   1 2    SAB TAB Ectopic Multiple Live Births                   Home Medications    Prior to Admission medications   Medication Sig Start Date End Date Taking? Authorizing Provider  baclofen (LIORESAL) 10 MG tablet Take 10 mg by mouth 3 (three) times daily.   Yes Historical Provider, MD  Ca Phosphate-Cholecalciferol (CALCIUM/VITAMIN D3 GUMMIES) 200-200 MG-UNIT CHEW Chew 1 tablet by mouth daily.    Yes Historical Provider, MD  calcium carbonate (TUMS - DOSED IN MG ELEMENTAL CALCIUM) 500 MG chewable tablet Chew 3 tablets by mouth daily as needed for indigestion or heartburn.   Yes Historical Provider, MD  gabapentin (NEURONTIN) 300 MG capsule Take 300 mg by mouth 3 (three) times daily.   Yes Historical Provider, MD  levothyroxine (SYNTHROID, LEVOTHROID) 125 MCG tablet Take 125 mcg by mouth daily before breakfast.   Yes Historical Provider, MD  lovastatin (MEVACOR) 20 MG tablet Take 20 mg by mouth at bedtime.   Yes Historical Provider, MD  Tapentadol HCl 100 MG TABS Take 50-100 mg by mouth 4 (four) times  daily as needed (pain).    Yes Historical Provider, MD  traZODone (DESYREL) 50 MG tablet Take 50 mg by mouth at bedtime as needed for sleep.   Yes Historical Provider, MD  ondansetron (ZOFRAN ODT) 4 MG disintegrating tablet 4mg  ODT q4 hours prn nausea/vomit 09/20/14   Comer Locket, PA-C  oxyCODONE-acetaminophen (ROXICET) 5-325 MG per tablet Take 1-2 tablets by mouth every 4 (four) hours as needed. Patient not taking: Reported on 09/20/2014 09/09/14   Mcarthur Rossetti, MD  tetrahydrozoline-zinc (VISINE-AC) 0.05-0.25 % ophthalmic solution Place 1 drop into both eyes 3 (three) times daily as needed (dry eyes).    Historical Provider, MD    Family History Family History  Problem Relation Age of Onset  .  Cancer Mother   . Hypertension Brother     Social History Social History  Substance Use Topics  . Smoking status: Former Research scientist (life sciences)  . Smokeless tobacco: Never Used     Comment: quit years ago - maybe 2002  . Alcohol use Yes     Comment: occasionally a couple of beers     Allergies   Iohexol and Naproxen sodium   Review of Systems Review of Systems  Constitutional: Negative for fever.  Cardiovascular: Positive for syncope.  Gastrointestinal: Negative for bowel incontinence.  Genitourinary: Negative for bladder incontinence.  All other systems reviewed and are negative.    Physical Exam Updated Vital Signs BP 115/84   Pulse 93   Temp 97.9 F (36.6 C) (Oral)   Resp 15   Ht 5\' 5"  (1.651 m)   Wt 175 lb (79.4 kg)   SpO2 100%   BMI 29.12 kg/m   Physical Exam  Constitutional: She is oriented to person, place, and time. She appears well-developed and well-nourished. No distress.  HENT:  Head: Normocephalic.  Nose: Nose normal.  Eyes: Conjunctivae are normal.  Neck: Neck supple. No tracheal deviation present.  Cardiovascular: Normal rate, regular rhythm and normal heart sounds.   Pulmonary/Chest: Effort normal and breath sounds normal. No respiratory distress.  Abdominal: Soft. She exhibits no distension.  Musculoskeletal:  Back and left hip tenderness  Neurological: She is alert and oriented to person, place, and time. GCS eye subscore is 4. GCS verbal subscore is 5. GCS motor subscore is 6.  Skin: Skin is warm and dry.  Psychiatric: She has a normal mood and affect.  Vitals reviewed.    ED Treatments / Results  Labs (all labs ordered are listed, but only abnormal results are displayed) Labs Reviewed  BASIC METABOLIC PANEL  CBC  CBG MONITORING, ED    EKG  EKG Interpretation  Date/Time:  Monday April 18 2016 17:07:42 EST Ventricular Rate:  98 PR Interval:    QRS Duration: 84 QT Interval:  363 QTC Calculation: 464 R Axis:   54 Text Interpretation:   Sinus rhythm Artifact No significant change since last tracing Confirmed by Keinan Brouillet MD, Eleana Tocco [id number]) on 04/18/2016 5:41:04 PM       Radiology No results found.  Procedures Procedures (including critical care time)  Medications Ordered in ED Medications  ibuprofen (ADVIL,MOTRIN) tablet 800 mg (800 mg Oral Given 04/18/16 1954)  ondansetron (ZOFRAN-ODT) disintegrating tablet 4 mg (4 mg Oral Given 04/18/16 1954)     Initial Impression / Assessment and Plan / ED Course  I have reviewed the triage vital signs and the nursing notes.  Pertinent labs & imaging results that were available during my care of the patient were reviewed by me and considered in my  medical decision making (see chart for details).     46 y.o. female presents with possible overdose on medications as she states she doubled them in an effort to better control her pain at home. She then became nauseated and sat on her toilet trying to vomit then lost consciousness. Workup unremarkable here. Appears to feel unwell but stands without orthostasis and no indication for admission with single syncope episode. She is maintaining her airway appropriately. Attempted discharge, see related encounter note for return visit shortly after and subsequent admission.   Final Clinical Impressions(s) / ED Diagnoses   Final diagnoses:  Medication overdose, undetermined intent, initial encounter  Chronic bilateral low back pain without sciatica  Vasovagal syncope    New Prescriptions Discharge Medication List as of 04/18/2016  8:54 PM    START taking these medications   Details  ibuprofen (ADVIL,MOTRIN) 800 MG tablet Take 1 tablet (800 mg total) by mouth every 6 (six) hours as needed for mild pain or moderate pain., Starting Mon 04/18/2016, Print         Leo Grosser, MD 04/19/16 [id number]

## 2016-04-19 ENCOUNTER — Observation Stay (HOSPITAL_COMMUNITY): Payer: Self-pay

## 2016-04-19 ENCOUNTER — Encounter (HOSPITAL_COMMUNITY): Payer: Self-pay | Admitting: Radiology

## 2016-04-19 ENCOUNTER — Observation Stay (HOSPITAL_BASED_OUTPATIENT_CLINIC_OR_DEPARTMENT_OTHER): Payer: Self-pay

## 2016-04-19 DIAGNOSIS — R55 Syncope and collapse: Secondary | ICD-10-CM

## 2016-04-19 LAB — URINALYSIS, ROUTINE W REFLEX MICROSCOPIC
Bilirubin Urine: NEGATIVE
Glucose, UA: NEGATIVE mg/dL
Hgb urine dipstick: NEGATIVE
Ketones, ur: 5 mg/dL — AB
Nitrite: NEGATIVE
Protein, ur: NEGATIVE mg/dL
Specific Gravity, Urine: 1.01 (ref 1.005–1.030)
pH: 5 (ref 5.0–8.0)

## 2016-04-19 LAB — RAPID URINE DRUG SCREEN, HOSP PERFORMED
Amphetamines: NOT DETECTED
Barbiturates: NOT DETECTED
Benzodiazepines: NOT DETECTED
Cocaine: NOT DETECTED
Opiates: NOT DETECTED
Tetrahydrocannabinol: NOT DETECTED

## 2016-04-19 LAB — BASIC METABOLIC PANEL
Anion gap: 5 (ref 5–15)
BUN: 10 mg/dL (ref 6–20)
CO2: 26 mmol/L (ref 22–32)
Calcium: 9.6 mg/dL (ref 8.9–10.3)
Chloride: 109 mmol/L (ref 101–111)
Creatinine, Ser: 0.87 mg/dL (ref 0.44–1.00)
GFR calc Af Amer: >60 mL/min (ref >60)
GFR calc non Af Amer: >60 mL/min (ref >60)
Glucose, Bld: 95 mg/dL (ref 65–99)
Potassium: 3.7 mmol/L (ref 3.5–5.1)
Sodium: 140 mmol/L (ref 135–145)

## 2016-04-19 LAB — CBC
HCT: 37.2 % (ref 36.0–46.0)
Hemoglobin: 12.6 g/dL (ref 12.0–15.0)
MCH: 29.9 pg (ref 26.0–34.0)
MCHC: 33.9 g/dL (ref 30.0–36.0)
MCV: 88.2 fL (ref 78.0–100.0)
Platelets: 227 10*3/uL (ref 150–400)
RBC: 4.22 MIL/uL (ref 3.87–5.11)
RDW: 12.9 % (ref 11.5–15.5)
WBC: 6.7 10*3/uL (ref 4.0–10.5)

## 2016-04-19 LAB — AMMONIA: Ammonia: <9 umol/L — ABNORMAL LOW (ref 9–35)

## 2016-04-19 LAB — ECHOCARDIOGRAM COMPLETE
Height: 65 in
Weight: 2800 oz

## 2016-04-19 LAB — MAGNESIUM: Magnesium: 1.8 mg/dL (ref 1.7–2.4)

## 2016-04-19 LAB — PREGNANCY, URINE: Preg Test, Ur: NEGATIVE

## 2016-04-19 LAB — ACETAMINOPHEN LEVEL: Acetaminophen (Tylenol), Serum: <10 ug/mL — ABNORMAL LOW (ref 10–30)

## 2016-04-19 LAB — T4, FREE: Free T4: 1.33 ng/dL — ABNORMAL HIGH (ref 0.61–1.12)

## 2016-04-19 LAB — TROPONIN I
Troponin I: <0.03 ng/mL (ref <0.03)
Troponin I: <0.03 ng/mL (ref <0.03)
Troponin I: <0.03 ng/mL (ref <0.03)

## 2016-04-19 LAB — SALICYLATE LEVEL: Salicylate Lvl: <7 mg/dL (ref 2.8–30.0)

## 2016-04-19 LAB — HIV ANTIBODY (ROUTINE TESTING W REFLEX): HIV Screen 4th Generation wRfx: NONREACTIVE

## 2016-04-19 MED ORDER — FAMOTIDINE 20 MG PO TABS
20.0000 mg | ORAL_TABLET | Freq: Every day | ORAL | Status: DC
  Administered 2016-04-19: 20 mg via ORAL
  Filled 2016-04-19: qty 1

## 2016-04-19 NOTE — Progress Notes (Signed)
  Echocardiogram 2D Echocardiogram has been performed.  Karen Nixon 04/19/2016, 10:00 AM

## 2016-04-19 NOTE — Discharge Summary (Signed)
Physician Discharge Summary  Karen Nixon [id number] DOB: 04-Jul-1970 DOA: 04/18/2016  PCP: Carmie Kanner, NP  Admit date: 04/18/2016 Discharge date: 04/19/2016  Time spent: 30 minutes  Recommendations for Outpatient Follow-up:  1. 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 echo  She declined ambulation at d/c 2/2 to p[ain and wished to follow with PCP Tele was benign for arrythmias  She was encouraged not to overuse her chr pain meds as this could lead to similar syncopy--syncopy felt related to polypharmacy   Echo this admit - Normal LV size with EF 60-65%. Normal RV size and systolic   function. No significant valvular abnormalities.   Discharge Exam: Vitals:   04/19/16 0107 04/19/16 0500  BP: 135/90 (!) 140/96  Pulse: 79 97  Resp: 16 16  Temp: 98.3 F (36.8 C) 98.3 F (36.8 C)    General: eomi ncat Cardiovascular:  s1 s2 no  m/r/g Respiratory:  Clear no added sound abd sfot nt nd no rebound  Discharge Instructions   Discharge Instructions    Diet - low sodium heart healthy    Complete by:  As directed    Discharge instructions    Complete by:  As directed    Follow with primary MD soon You will need pain management   Increase activity slowly    Complete by:  As directed      Current Discharge Medication List    CONTINUE these medications which have NOT CHANGED   Details  baclofen (LIORESAL) 10 MG tablet Take 10 mg by mouth 3 (three) times daily.    Ca Phosphate-Cholecalciferol (CALCIUM/VITAMIN D3 GUMMIES) 200-200 MG-UNIT CHEW Chew 1 tablet by mouth daily.     calcium carbonate (TUMS - DOSED IN MG ELEMENTAL CALCIUM) 500 MG chewable tablet Chew 3 tablets by mouth daily as needed for indigestion or heartburn.    gabapentin (NEURONTIN) 300 MG capsule Take 300 mg by mouth 3 (three) times daily.    ibuprofen (ADVIL,MOTRIN) 800 MG tablet Take 1 tablet (800 mg total) by mouth every 6 (six) hours as needed for mild pain or moderate pain. Qty: 21 tablet, Refills: 0    levothyroxine (SYNTHROID, LEVOTHROID) 125 MCG tablet Take 125 mcg by mouth daily before breakfast.    lovastatin (MEVACOR) 20 MG tablet Take 20 mg by mouth at bedtime.    ondansetron (ZOFRAN ODT) 4 MG disintegrating tablet 4mg  ODT q4 hours prn nausea/vomit Qty: 10 tablet, Refills: 0    tapentadol (NUCYNTA) 50 MG tablet Take 50 mg by mouth every  6 (six) hours as needed for severe pain.    tetrahydrozoline-zinc (VISINE-AC) 0.05-0.25 % ophthalmic solution Place 1 drop into both eyes 3 (three) times daily as needed (dry eyes).    traZODone (DESYREL) 50 MG tablet Take 50 mg by mouth at bedtime.        Allergies  Allergen Reactions  . Aleve [Naproxen Sodium]   . Iohexol      Code: HIVES, Desc: Pt developed hives on neck, face and back with itching. Given 50mg  oral benadryl., Onset Date: [id number]   . Naproxen Sodium     REACTION:  swelling per pt      The results of significant diagnostics from this hospitalization (including imaging, microbiology, ancillary and laboratory) are listed below for reference.    Significant Diagnostic Studies: Ct Head Wo Contrast  Result Date: 04/19/2016 CLINICAL DATA:  syncopal episode. Patient states last afternoon around 3 PM patient had severe pain and took her Neurontin and Nucynta together which she usually does not take together. Denies overdosing. Following which she felt nauseated and went to the bathroom and was sitting on the commode when she passed out and was on the floor. She did hit her head. EMS was called and patient was brought to the ER. In the ER patient was mildly lethargic and did not respond to Narcan. After patient became alert and awake patient was planned to be discharged home when at the discharge area while getting off the wheelchair patient had another syncopal episode. Which lasted for a few seconds. Patient denies any chest pain or shortness of breath but was diaphoretic. EKG shows normal sinus rhythm with QTC of 464 ms. Denies any new medications recently. Since patient has had 2 episodes of syncope patient is being admitted for further observation EXAM: CT HEAD WITHOUT CONTRAST TECHNIQUE: Contiguous axial images were obtained from the base of the skull through the vertex without intravenous contrast. COMPARISON:  03/25/2014 and previous FINDINGS: Brain: No evidence of acute infarction, hemorrhage, hydrocephalus, extra-axial collection or mass lesion/mass effect. Vascular: No hyperdense vessel or unexpected calcification. Skull: Normal. Negative for fracture or focal lesion. Sinuses/Orbits: No acute finding. Other: None. IMPRESSION: Negative for bleed or other acute intracranial process. Electronically Signed   By: Lucrezia Europe M.D.   On: 04/19/2016 08:57   Dg Chest Port 1 View  Result Date: 04/19/2016 CLINICAL DATA:  syncopal episode. Patient states last afternoon  around 3 PM patient had severe pain and took her Neurontin and Nucynta together which she usually does not take together. Denies overdosing. Following which she felt nauseated and went to the bathroom and was sitting on the commode when she passed out and was on the floor. She did hit her head. EMS was called and patient was brought to the ER. In the ER patient was mildly lethargic and did not respond to Narcan. After patient became alert and awake patient was planned to be discharged home when at the discharge area while getting off the wheelchair patient had another syncopal episode. Which lasted for a few seconds. Patient denies any chest pain or shortness of breath but was diaphoretic. EKG shows normal sinus rhythm with QTC of 464 ms. Denies any new medications recently. Since patient has had 2 episodes of syncope patient is being admitted for further observation EXAM: PORTABLE CHEST - 1 VIEW COMPARISON:  04/22/2004 FINDINGS: Lungs are clear. Heart size and mediastinal contours are within normal limits. No effusion.  No pneumothorax. Thoracolumbar dextroscoliosis apex T11 without evident underlying vertebral anomaly,  as before IMPRESSION: No acute cardiopulmonary disease. Electronically Signed   By: Lucrezia Europe M.D.   On: 04/19/2016 08:58    Microbiology: No results found for this or any previous visit (from the past 240 hour(s)).   Labs: Basic Metabolic Panel:  Recent Labs Lab 04/18/16 1858 04/18/16 2349 04/19/16 0615  NA 141  --  140  K 4.5  --  3.7  CL 108  --  109  CO2 28  --  26  GLUCOSE 99  --  95  BUN 10  --  10  CREATININE 0.95  --  0.87  CALCIUM 10.0  --  9.6  MG  --  1.8  --    Liver Function Tests: No results for input(s): AST, ALT, ALKPHOS, BILITOT, PROT, ALBUMIN in the last 168 hours. No results for input(s): LIPASE, AMYLASE in the last 168 hours.  Recent Labs Lab 04/18/16 2313  AMMONIA <9*   CBC:  Recent Labs Lab 04/18/16 1858 04/19/16 0615  WBC 9.4 6.7  HGB  13.9 12.6  HCT 41.0 37.2  MCV 88.2 88.2  PLT 237 227   Cardiac Enzymes:  Recent Labs Lab 04/18/16 2349 04/19/16 0615 04/19/16 1246  TROPONINI <0.03 <0.03 <0.03   BNP: BNP (last 3 results) No results for input(s): BNP in the last 8760 hours.  ProBNP (last 3 results) No results for input(s): PROBNP in the last 8760 hours.  CBG:  Recent Labs Lab 04/18/16 1714 04/18/16 2135  GLUCAP 92 154*       Signed:  Nita Sells MD   [facility] 04/19/2016, 2:52 PM

## 2016-12-01 ENCOUNTER — Emergency Department (HOSPITAL_COMMUNITY)
Admission: EM | Admit: 2016-12-01 | Discharge: 2016-12-01 | Disposition: A | Discharge status: Home / Self Care | Payer: Self-pay | Attending: Emergency Medicine | Admitting: Emergency Medicine

## 2016-12-01 ENCOUNTER — Encounter (HOSPITAL_COMMUNITY): Payer: Self-pay | Admitting: Emergency Medicine

## 2016-12-01 ENCOUNTER — Emergency Department (HOSPITAL_COMMUNITY): Payer: Self-pay

## 2016-12-01 DIAGNOSIS — W0110XA Fall on same level from slipping, tripping and stumbling with subsequent striking against unspecified object, initial encounter: Secondary | ICD-10-CM | POA: Insufficient documentation

## 2016-12-01 DIAGNOSIS — W19XXXA Unspecified fall, initial encounter: Secondary | ICD-10-CM

## 2016-12-01 DIAGNOSIS — Y9301 Activity, walking, marching and hiking: Secondary | ICD-10-CM | POA: Insufficient documentation

## 2016-12-01 DIAGNOSIS — Z79899 Other long term (current) drug therapy: Secondary | ICD-10-CM | POA: Insufficient documentation

## 2016-12-01 DIAGNOSIS — S63502A Unspecified sprain of left wrist, initial encounter: Secondary | ICD-10-CM

## 2016-12-01 DIAGNOSIS — M25422 Effusion, left elbow: Secondary | ICD-10-CM

## 2016-12-01 DIAGNOSIS — E039 Hypothyroidism, unspecified: Secondary | ICD-10-CM | POA: Insufficient documentation

## 2016-12-01 DIAGNOSIS — Z87891 Personal history of nicotine dependence: Secondary | ICD-10-CM | POA: Insufficient documentation

## 2016-12-01 DIAGNOSIS — Y999 Unspecified external cause status: Secondary | ICD-10-CM | POA: Insufficient documentation

## 2016-12-01 DIAGNOSIS — Y92481 Parking lot as the place of occurrence of the external cause: Secondary | ICD-10-CM | POA: Insufficient documentation

## 2016-12-01 MED ORDER — HYDROCODONE-ACETAMINOPHEN 5-325 MG PO TABS
1.0000 | ORAL_TABLET | Freq: Once | ORAL | Status: AC
  Administered 2016-12-01: 1 via ORAL
  Filled 2016-12-01: qty 1

## 2016-12-01 MED ORDER — HYDROCODONE-ACETAMINOPHEN 5-325 MG PO TABS: 2.0000 | ORAL_TABLET | ORAL | 0 refills | Status: DC | PRN

## 2016-12-01 MED ORDER — ONDANSETRON 4 MG PO TBDP
4.0000 mg | ORAL_TABLET | Freq: Once | ORAL | Status: AC
  Administered 2016-12-01: 4 mg via ORAL
  Filled 2016-12-01: qty 1

## 2016-12-01 NOTE — ED Provider Notes (Addendum)
[facility] DEPT Provider Note   CSN: [id number] Arrival date & time: 12/01/16  1024     History   Chief Complaint Chief Complaint  Patient presents with  . Arm Injury    L arm    HPI Karen Nixon is a 46 y.o. female who presents to the ED with arm pain s/p fall this morning. Patient reports that she was walking and tripped and fell in the parking lot where she lives. Patient landed on her left elbow. She felt severe pain immediately and had swelling to the left elbow. The pain radiates to the left hand.   The history is provided by the patient. No language interpreter was used.  Arm Injury   This is a new problem. The current episode started 3 to 5 hours ago. The pain is present in the left elbow. The pain is at a severity of 10/10. She has tried nothing for the symptoms.    Past Medical History:  Diagnosis Date  . Anxiety   . DDD (degenerative disc disease), lumbar   . Degenerative disc disease, lumbar   . Depression    due to chronic pain  . Dislocation of shoulder    x 9  . GERD (gastroesophageal reflux disease)   . Hyperlipemia   . Hypothyroidism   . Left facial numbness 2010   and tingling  and left arm- years ago.  Dr says it was stress  . Scoliosis   . Shortness of breath dyspnea    with exertation  . Thyroid disease   . Umbilical hernia     Patient Active Problem List   Diagnosis Date Noted  . Syncope 04/18/2016  . Chronic pain 04/18/2016  . 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 BREAST MASS, LEFT 09/26/2007  . RHINITIS, ALLERGIC NOS 10/26/2006  . [place] DISEASE 10/24/2006  . Hypothyroidism 10/24/2006  . HYPERTENSION 10/24/2006  . HX, PERSONAL, CERVICAL DYSPLASIA 10/24/2006    Past Surgical History:  Procedure Laterality Date  . BACK SURGERY  2005  . CERVICAL CONIZATION W/BX    . ELBOW SURGERY Right 2007   fracture repair with screws  . INDUCED ABORTION    . SHOULDER ARTHROSCOPY Right 09/09/2014   Procedure: RIGHT SHOULDER  ARTHROSCOPY WITH LABRAL REPAIR;  Surgeon: Mcarthur Rossetti, MD;  Location: [facility];  Service: Orthopedics;  Laterality: Right;  . SHOULDER SURGERY Right 2001   arthroscopy  . TUBAL LIGATION  11/20/1996    OB History    Gravida Para Term Preterm AB Living   3 2 2   1 2    SAB TAB Ectopic Multiple Live Births                   Home Medications    Prior to Admission medications   Medication Sig Start Date End Date Taking? Authorizing Provider  baclofen (LIORESAL) 10 MG tablet Take 10 mg by mouth 3 (three) times daily.    [provider]  Ca Phosphate-Cholecalciferol (CALCIUM/VITAMIN D3 GUMMIES) 200-200 MG-UNIT CHEW Chew 1 tablet by mouth daily.     [provider]  calcium carbonate (TUMS - DOSED IN MG ELEMENTAL CALCIUM) 500 MG chewable tablet Chew 3 tablets by mouth daily as needed for indigestion or heartburn.    [provider]  gabapentin (NEURONTIN) 300 MG capsule Take 300 mg by mouth 3 (three) times daily.    [provider]  ibuprofen (ADVIL,MOTRIN) 800 MG tablet Take 1 tablet (800 mg total) by mouth every 6 (  six) hours as needed for mild pain or moderate pain. 04/18/16   Leo Grosser, MD  levothyroxine (SYNTHROID, LEVOTHROID) 125 MCG tablet Take 125 mcg by mouth daily before breakfast.    [provider]  lovastatin (MEVACOR) 20 MG tablet Take 20 mg by mouth at bedtime.    [provider]  ondansetron (ZOFRAN ODT) 4 MG disintegrating tablet 4mg  ODT q4 hours prn nausea/vomit 09/20/14   Cartner, Marland Kitchen, PA-C  tapentadol (NUCYNTA) 50 MG tablet Take 50 mg by mouth every 6 (six) hours as needed for severe pain.    [provider]  tetrahydrozoline-zinc (VISINE-AC) 0.05-0.25 % ophthalmic solution Place 1 drop into both eyes 3 (three) times daily as needed (dry eyes).    [provider]  traZODone (DESYREL) 50 MG tablet Take 50 mg by mouth at bedtime.     [provider]    Family History Family History   Problem Relation Age of Onset  . Cancer Mother   . Hypertension Brother     Social History Social History  Substance Use Topics  . Smoking status: Former Research scientist (life sciences)  . Smokeless tobacco: Never Used     Comment: quit years ago - maybe 2002  . Alcohol use Yes     Comment: occasionally a couple of beers     Allergies   Aleve [naproxen sodium]; Iohexol; and Naproxen sodium   Review of Systems Review of Systems  HENT: Negative.   Eyes: Negative for visual disturbance.  Genitourinary:       No loss of control of bladder or bowels.   Musculoskeletal: Positive for arthralgias.       Left elbow  Skin: Positive for wound.  Neurological: Positive for light-headedness. Negative for syncope and headaches.  Psychiatric/Behavioral: Negative for confusion.     Physical Exam Updated Vital Signs BP (!) 150/114   Pulse 87   Temp 97.8 F (36.6 C) (Oral)   Resp 18   SpO2 97%   Physical Exam  Constitutional: She is oriented to person, place, and time. She appears well-developed and well-nourished. No distress.  Patient appears to be in pain.  HENT:  Head: Normocephalic and atraumatic.  Right Ear: Tympanic membrane normal.  Left Ear: Tympanic membrane normal.  Mouth/Throat: Uvula is midline and oropharynx is clear and moist.  Eyes: Pupils are equal, round, and reactive to light. Conjunctivae and EOM are normal.  Neck: Neck supple.  Cardiovascular: Normal rate and regular rhythm.   Pulmonary/Chest: Effort normal and breath sounds normal.  Abdominal: Soft. There is no tenderness.  Musculoskeletal:       Left elbow: She exhibits decreased range of motion and effusion. She exhibits no deformity and no laceration. Tenderness found. Radial head tenderness noted.  Radial pulse 2+, adequate circulation, equal grips, good touch sensation. Pain radiates to the left hand. There is tenderness to the ulnar aspect of the wrist.   Neurological: She is alert and oriented to person, place, and time.  No cranial nerve deficit.  Skin: Skin is warm and dry.  Small superficial abrasion to left arm, no bleeding  Psychiatric: She has a normal mood and affect.  Nursing note and vitals reviewed.    ED Treatments / Results: Wrist splint applied by ortho tec. Patient continues to be neurovascularly intact at d/c.   Labs (all labs ordered are listed, but only abnormal results are displayed) Labs Reviewed - No data to display  Radiology Dg Elbow Complete Left  Result Date: 12/01/2016 CLINICAL DATA:  Tripped on  the curb EXAM: LEFT ELBOW - COMPLETE 3+ VIEW COMPARISON:  None. FINDINGS: There is a joint effusion, suggesting intraarticular fracture. Question minimal radial head fracture. No large fracture or dislocation. IMPRESSION: Joint effusion. Question minimal radial head fracture. No large fracture or dislocation. Electronically Signed   By: Nelson Chimes M.D.   On: 12/01/2016 11:24   Dg Forearm Left  Result Date: 12/01/2016 CLINICAL DATA:  Tripped on the curb EXAM: LEFT FOREARM - 2 VIEW COMPARISON:  None. FINDINGS: There is no evidence of fracture or other focal bone lesions. Soft tissues are unremarkable. IMPRESSION: Negative. Electronically Signed   By: Nelson Chimes M.D.   On: 12/01/2016 11:22    Procedures Procedures (including critical care time)  Medications Ordered in ED Medications  HYDROcodone-acetaminophen (NORCO/VICODIN) 5-325 MG per tablet 1 tablet (1 tablet Oral Given 12/01/16 1132)  ondansetron (ZOFRAN-ODT) disintegrating tablet 4 mg (4 mg Oral Given 12/01/16 1132)     Initial Impression / Assessment and Plan / ED Course  I have reviewed the triage vital signs and the nursing notes. 46 y.o. female with left elbow pain and swelling s/p fall stable for d/c without neuro deficits and states that pain is 100%  Better with the splint and ice. She will call her PCP, Marliss Coots, NP to assist her with ortho follow up. Return precautions discussed.   Final Clinical Impressions(s)  / ED Diagnoses   Final diagnoses:  Effusion of left elbow  Wrist sprain, left, initial encounter  Fall, initial encounter    New Prescriptions Discharge Medication List as of 12/01/2016  1:19 PM    START taking these medications   Details  HYDROcodone-acetaminophen (NORCO/VICODIN) 5-325 MG tablet Take 2 tablets by mouth every 4 (four) hours as needed., Starting Thu 12/01/2016, Print      Rx no given, patient states she does not want narcotic.    Debroah Baller [place], [place] 12/01/16 [place], Nathan, MD 12/01/16 1631    Debroah Baller [place], NP 12/28/16 [place], Nathan, MD 12/29/16 [phone]

## 2016-12-01 NOTE — ED Triage Notes (Signed)
Pt with fall this morning at 0700, denies syncope or LOC, pt states she landed on L elbow. Pt presents with swelling and pain to L elbow and abrasion to L upper forearm. Ice applied.

## 2016-12-01 NOTE — Discharge Instructions (Signed)
Follow up with Audrea Muscat Placey she can help you with appointment with  the orthopedic doctor. Call her today.

## 2016-12-01 NOTE — ED Notes (Signed)
Bed: WTR6 Expected date:  Expected time:  Means of arrival:  Comments: 

## 2016-12-15 DIAGNOSIS — M21372 Foot drop, left foot: Secondary | ICD-10-CM | POA: Insufficient documentation

## 2017-03-21 ENCOUNTER — Other Ambulatory Visit: Payer: Self-pay

## 2017-03-21 DIAGNOSIS — Z1231 Encounter for screening mammogram for malignant neoplasm of breast: Secondary | ICD-10-CM

## 2017-04-03 ENCOUNTER — Other Ambulatory Visit: Payer: Self-pay | Admitting: *Deleted

## 2017-04-03 DIAGNOSIS — Z1231 Encounter for screening mammogram for malignant neoplasm of breast: Secondary | ICD-10-CM

## 2017-04-05 ENCOUNTER — Ambulatory Visit
Admission: RE | Admit: 2017-04-05 | Discharge: 2017-04-05 | Disposition: A | Discharge status: Home / Self Care | Payer: Self-pay | Source: Ambulatory Visit | Attending: *Deleted | Admitting: *Deleted

## 2017-04-05 DIAGNOSIS — Z1231 Encounter for screening mammogram for malignant neoplasm of breast: Secondary | ICD-10-CM

## 2017-12-21 ENCOUNTER — Encounter (HOSPITAL_COMMUNITY): Payer: Self-pay | Admitting: Emergency Medicine

## 2017-12-21 ENCOUNTER — Ambulatory Visit (HOSPITAL_COMMUNITY)
Admission: EM | Admit: 2017-12-21 | Discharge: 2017-12-21 | Disposition: A | Discharge status: Home / Self Care | Payer: No Typology Code available for payment source | Attending: Family Medicine | Admitting: Family Medicine

## 2017-12-21 DIAGNOSIS — W260XXA Contact with knife, initial encounter: Secondary | ICD-10-CM

## 2017-12-21 DIAGNOSIS — S61012A Laceration without foreign body of left thumb without damage to nail, initial encounter: Secondary | ICD-10-CM

## 2017-12-21 DIAGNOSIS — Z23 Encounter for immunization: Secondary | ICD-10-CM

## 2017-12-21 DIAGNOSIS — S61211A Laceration without foreign body of left index finger without damage to nail, initial encounter: Secondary | ICD-10-CM

## 2017-12-21 MED ORDER — LIDOCAINE-EPINEPHRINE-TETRACAINE (LET) SOLUTION
3.0000 mL | Freq: Once | NASAL | Status: AC
  Administered 2017-12-21: 3 mL via TOPICAL

## 2017-12-21 MED ORDER — LIDOCAINE-EPINEPHRINE-TETRACAINE (LET) SOLUTION
NASAL | Status: AC
  Filled 2017-12-21: qty 3

## 2017-12-21 MED ORDER — TETANUS-DIPHTH-ACELL PERTUSSIS 5-2.5-18.5 LF-MCG/0.5 IM SUSP
0.5000 mL | Freq: Once | INTRAMUSCULAR | Status: AC
  Administered 2017-12-21: 0.5 mL via INTRAMUSCULAR

## 2017-12-21 MED ORDER — TETANUS-DIPHTH-ACELL PERTUSSIS 5-2.5-18.5 LF-MCG/0.5 IM SUSP
INTRAMUSCULAR | Status: AC
  Filled 2017-12-21: qty 0.5

## 2017-12-21 NOTE — ED Triage Notes (Signed)
Pt states she sliced open her L pointer finger cutting some cabbage. Unsure last tetanus shot.

## 2017-12-21 NOTE — Discharge Instructions (Signed)
May leave these dressings on for the next 24 hours.  May wear the splint on the finger for the next 2-3 days to keep glue in place better.  Glue will eventually peel off, try to keep intact for at least 5 days as able.  Once glue falls off wash wound daily with soap and water, keep covered to keep clean until healed.  Return or go to PCP if develop increased redness, pain, pus drainage or otherwise concerned.

## 2017-12-21 NOTE — ED Provider Notes (Signed)
[place]    CSN: [id number] Arrival date & time: 12/21/17  1356     History   Chief Complaint Chief Complaint  Patient presents with  . Extremity Laceration    HPI Karen Nixon is a 47 y.o. female.   Karen Nixon presents with complaints of lacerations to left pointer finger as well as left thumb. She was cutting cabbage and accidentally slipped which caused the lacerations. Did not cleanse wounds but applied pressure and came to urgent care. Occurred approximately 45 minutes prior to being seen. Pain 3-4//10. Throbbing. No numbness or tingling but more pain primarily to left index finger. Minimal pain to thumb. Unknown last tetanus. Hx of anxiety, ddd, depression, gerd, chronic pain, hypothyroidism, scoliosis.     ROS per HPI.      Past Medical History:  Diagnosis Date  . Anxiety   . DDD (degenerative disc disease), lumbar   . Degenerative disc disease, lumbar   . Depression    due to chronic pain  . Dislocation of shoulder    x 9  . GERD (gastroesophageal reflux disease)   . Hyperlipemia   . Hypothyroidism   . Left facial numbness 2010   and tingling  and left arm- years ago.  Dr says it was stress  . Scoliosis   . Shortness of breath dyspnea    with exertation  . Thyroid disease   . Umbilical hernia     Patient Active Problem List   Diagnosis Date Noted  . Syncope 04/18/2016  . Chronic pain 04/18/2016  . 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 BREAST MASS, LEFT 09/26/2007  . RHINITIS, ALLERGIC NOS 10/26/2006  . [place] DISEASE 10/24/2006  . Hypothyroidism 10/24/2006  . HYPERTENSION 10/24/2006  . HX, PERSONAL, CERVICAL DYSPLASIA 10/24/2006    Past Surgical History:  Procedure Laterality Date  . BACK SURGERY  2005  . CERVICAL CONIZATION W/BX    . ELBOW SURGERY Right 2007   fracture repair with screws  . INDUCED ABORTION    . SHOULDER ARTHROSCOPY Right 09/09/2014   Procedure: RIGHT SHOULDER ARTHROSCOPY WITH LABRAL REPAIR;   Surgeon: Mcarthur Rossetti, MD;  Location: [facility];  Service: Orthopedics;  Laterality: Right;  . SHOULDER SURGERY Right 2001   arthroscopy  . TUBAL LIGATION  11/20/1996    OB History    Gravida  3   Para  2   Term  2   Preterm      AB  1   Living  2     SAB      TAB      Ectopic      Multiple      Live Births               Home Medications    Prior to Admission medications   Medication Sig Start Date End Date Taking? Authorizing Provider  baclofen (LIORESAL) 10 MG tablet Take 10 mg by mouth 3 (three) times daily.    [provider]  Ca Phosphate-Cholecalciferol (CALCIUM/VITAMIN D3 GUMMIES) 200-200 MG-UNIT CHEW Chew 1 tablet by mouth daily.     [provider]  calcium carbonate (TUMS - DOSED IN MG ELEMENTAL CALCIUM) 500 MG chewable tablet Chew 3 tablets by mouth daily as needed for indigestion or heartburn.    [provider]  gabapentin (NEURONTIN) 300 MG capsule Take 300 mg by mouth 3 (three) times daily.    [provider]  ibuprofen (ADVIL,MOTRIN) 800 MG tablet Take 1 tablet (800  mg total) by mouth every 6 (six) hours as needed for mild pain or moderate pain. 04/18/16   Leo Grosser, MD  levothyroxine (SYNTHROID, LEVOTHROID) 125 MCG tablet Take 125 mcg by mouth daily before breakfast.    [provider]  lovastatin (MEVACOR) 20 MG tablet Take 20 mg by mouth at bedtime.    [provider]  ondansetron (ZOFRAN ODT) 4 MG disintegrating tablet 4mg  ODT q4 hours prn nausea/vomit 09/20/14   Cartner, Marland Kitchen, PA-C  tapentadol (NUCYNTA) 50 MG tablet Take 50 mg by mouth every 6 (six) hours as needed for severe pain.    [provider]  tetrahydrozoline-zinc (VISINE-AC) 0.05-0.25 % ophthalmic solution Place 1 drop into both eyes 3 (three) times daily as needed (dry eyes).    [provider]  traZODone (DESYREL) 50 MG tablet Take 50 mg by mouth at bedtime.     [provider]    Family  History Family History  Problem Relation Age of Onset  . Cancer Mother   . Hypertension Brother     Social History Social History   Tobacco Use  . Smoking status: Former Research scientist (life sciences)  . Smokeless tobacco: Never Used  . Tobacco comment: quit years ago - maybe 2002  Substance Use Topics  . Alcohol use: Yes    Comment: occasionally a couple of beers  . Drug use: No     Allergies   Aleve [naproxen sodium]; Iohexol; and Naproxen sodium   Review of Systems Review of Systems   Physical Exam Triage Vital Signs ED Triage Vitals  Enc Vitals Group     BP 12/21/17 1413 129/90     Pulse Rate 12/21/17 1413 (!) 112     Resp 12/21/17 1413 18     Temp 12/21/17 1413 98.2 F (36.8 C)     Temp Source 12/21/17 1413 Oral     SpO2 12/21/17 1413 100 %     Weight --      Height --      Head Circumference --      Peak Flow --      Pain Score 12/21/17 1416 4     Pain Loc --      Pain Edu? --      Excl. in [place]? --    No data found.  Updated Vital Signs BP 129/90 (BP Location: Right Arm)   Pulse (!) 112   Temp 98.2 F (36.8 C) (Oral)   Resp 18   SpO2 100%    Physical Exam  Constitutional: She is oriented to person, place, and time. She appears well-developed and well-nourished. No distress.  Cardiovascular: Normal rate, regular rhythm and normal heart sounds.  Pulmonary/Chest: Effort normal and breath sounds normal.  Musculoskeletal:       Left hand: She exhibits laceration. Normal sensation noted. Normal strength noted.       Hands: Approximately 1 cm laceration to palmar left hand pointer finger across middle phalanx, well approximated; no active bleeding; cap refill < 2 seconds, gross sensation intact, no foreign bodies visualized; full ROM of pointer finger in all directions with strength intact; approximately 0.5 cm oval laceration to dorsal aspect of left thumb over proximal phalanx; no active bleeding; cap refill < 2 seconds; full ROm of thumb with strength intact    Neurological: She is alert and oriented to person, place, and time.  Skin: Skin is warm and dry.     UC Treatments / Results  Labs (all labs ordered are listed, but only  abnormal results are displayed) Labs Reviewed - No data to display  EKG None  Radiology No results found.  Procedures Laceration Repair Date/Time: 12/21/2017 3:22 PM Performed by: Zigmund Gottron, NP Authorized by: Raylene Everts, MD   Consent:    Consent obtained:  Verbal   Consent given by:  Patient   Risks discussed:  Infection, poor cosmetic result and pain   Alternatives discussed:  No treatment, observation, delayed treatment and referral Anesthesia (see MAR for exact dosages):    Anesthesia method:  Topical application   Topical anesthetic:  LET Laceration details:    Location:  Finger   Finger location:  L thumb (and L INDEX FINGER )   Length (cm):  1 (thumb 0.5; index 1cm )   Depth (mm):  0.5 (superficial ) Repair type:    Repair type:  Simple Exploration:    Hemostasis achieved with:  LET   Wound exploration: wound explored through full range of motion and entire depth of wound probed and visualized     Wound extent: no areolar tissue violation noted, no fascia violation noted, no foreign bodies/material noted, no muscle damage noted, no nerve damage noted, no tendon damage noted, no underlying fracture noted and no vascular damage noted     Contaminated: no   Treatment:    Area cleansed with:  Shur-Clens and soap and water   Amount of cleaning:  Standard Skin repair:    Repair method:  Tissue adhesive Approximation:    Approximation:  Close Post-procedure details:    Dressing:  Non-adherent dressing   Patient tolerance of procedure:  Tolerated well, no immediate complications Comments:     Small splint applied to pointer finger to immobilize the area for the next 24-48 hours as well.    (including critical care time)  Medications Ordered in UC Medications  Tdap (BOOSTRIX)  injection 0.5 mL (0.5 mLs Intramuscular Given 12/21/17 1433)  lidocaine-EPINEPHrine-tetracaine (LET) solution (3 mLs Topical Given 12/21/17 1433)    Initial Impression / Assessment and Plan / UC Course  I have reviewed the triage vital signs and the nursing notes.  Pertinent labs & imaging results that were available during my care of the patient were reviewed by me and considered in my medical decision making (see chart for details).     Superficial well approximated knife lacerations. Not over joints. No underlying tissue damage. Well approximated closure with dermabond obtained. Wound care education provided and discussed. Patient verbalized understanding and agreeable to plan.   Final Clinical Impressions(s) / UC Diagnoses   Final diagnoses:  Laceration of left index finger without foreign body without damage to nail, initial encounter  Laceration of left thumb without foreign body without damage to nail, initial encounter     Discharge Instructions     May leave these dressings on for the next 24 hours.  May wear the splint on the finger for the next 2-3 days to keep glue in place better.  Glue will eventually peel off, try to keep intact for at least 5 days as able.  Once glue falls off wash wound daily with soap and water, keep covered to keep clean until healed.  Return or go to PCP if develop increased redness, pain, pus drainage or otherwise concerned.     ED Prescriptions    None     Controlled Substance Prescriptions [place] Controlled Substance Registry consulted? Not Applicable   Zigmund Gottron, NP 12/21/17 1525

## 2018-09-24 ENCOUNTER — Other Ambulatory Visit: Payer: Self-pay

## 2018-09-24 ENCOUNTER — Ambulatory Visit (HOSPITAL_COMMUNITY)
Admission: EM | Admit: 2018-09-24 | Discharge: 2018-09-24 | Disposition: A | Discharge status: Home / Self Care | Payer: Medicaid Other | Attending: Family Medicine | Admitting: Family Medicine

## 2018-09-24 ENCOUNTER — Encounter (HOSPITAL_COMMUNITY): Payer: Self-pay

## 2018-09-24 DIAGNOSIS — I10 Essential (primary) hypertension: Secondary | ICD-10-CM | POA: Diagnosis not present

## 2018-09-24 DIAGNOSIS — Z760 Encounter for issue of repeat prescription: Secondary | ICD-10-CM | POA: Diagnosis not present

## 2018-09-24 MED ORDER — HYDROCHLOROTHIAZIDE 25 MG PO TABS: 25.0000 mg | ORAL_TABLET | Freq: Every day | ORAL | 2 refills | Status: DC

## 2018-09-24 NOTE — ED Triage Notes (Signed)
Pt presents for medication refill on blood pressure medication. 

## 2018-09-24 NOTE — Discharge Instructions (Addendum)
Refilling her blood pressure medication as requested. Follow-up with your primary care on 8 September as planned

## 2018-09-25 NOTE — ED Provider Notes (Signed)
[facility] [place]    CSN: [id number] Arrival date & time: 09/24/18  1441     History   Chief Complaint Chief Complaint  Patient presents with  . Medication Refill    HPI Karen Nixon is a 48 y.o. female.   Patient is a 48 year old female with past medical history of anxiety, depression, degenerative disc disease, GERD, hypothyroidism, hypertension.  She presents today needing medication refill.  She needs her blood pressure medication.  She takes hydrochlorothiazide daily.  She is currently in between doctors.  Scheduled to see primary care physician on 8 September.  Blood pressure normotensive here in clinic today.  She denies any headache, dizziness, blurred vision, chest pain or shortness of breath.  ROS per HPI      Past Medical History:  Diagnosis Date  . Anxiety   . DDD (degenerative disc disease), lumbar   . Degenerative disc disease, lumbar   . Depression    due to chronic pain  . Dislocation of shoulder    x 9  . GERD (gastroesophageal reflux disease)   . Hyperlipemia   . Hypothyroidism   . Left facial numbness 2010   and tingling  and left arm- years ago.  Dr says it was stress  . Scoliosis   . Shortness of breath dyspnea    with exertation  . Thyroid disease   . Umbilical hernia     Patient Active Problem List   Diagnosis Date Noted  . Syncope 04/18/2016  . Chronic pain 04/18/2016  . 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 BREAST MASS, LEFT 09/26/2007  . RHINITIS, ALLERGIC NOS 10/26/2006  . [place] DISEASE 10/24/2006  . Hypothyroidism 10/24/2006  . HYPERTENSION 10/24/2006  . HX, PERSONAL, CERVICAL DYSPLASIA 10/24/2006    Past Surgical History:  Procedure Laterality Date  . BACK SURGERY  2005  . CERVICAL CONIZATION W/BX    . ELBOW SURGERY Right 2007   fracture repair with screws  . INDUCED ABORTION    . SHOULDER ARTHROSCOPY Right 09/09/2014   Procedure: RIGHT SHOULDER ARTHROSCOPY WITH LABRAL REPAIR;  Surgeon: Mcarthur Rossetti, MD;  Location: [facility];  Service: Orthopedics;  Laterality: Right;  . SHOULDER SURGERY Right 2001   arthroscopy  . TUBAL LIGATION  11/20/1996    OB History    Gravida  3   Para  2   Term  2   Preterm      AB  1   Living  2     SAB      TAB      Ectopic      Multiple      Live Births               Home Medications    Prior to Admission medications   Medication Sig Start Date End Date Taking? Authorizing Provider  baclofen (LIORESAL) 10 MG tablet Take 10 mg by mouth 3 (three) times daily.    [provider]  Ca Phosphate-Cholecalciferol (CALCIUM/VITAMIN D3 GUMMIES) 200-200 MG-UNIT CHEW Chew 1 tablet by mouth daily.     [provider]  calcium carbonate (TUMS - DOSED IN MG ELEMENTAL CALCIUM) 500 MG chewable tablet Chew 3 tablets by mouth daily as needed for indigestion or heartburn.    [provider]  gabapentin (NEURONTIN) 300 MG capsule Take 300 mg by mouth 3 (three) times daily.    [provider]  hydrochlorothiazide (HYDRODIURIL) 25 MG tablet Take 1 tablet (25 mg total) by mouth daily.  09/24/18   Loura Halt A, NP  ibuprofen (ADVIL,MOTRIN) 800 MG tablet Take 1 tablet (800 mg total) by mouth every 6 (six) hours as needed for mild pain or moderate pain. 04/18/16   Leo Grosser, MD  levothyroxine (SYNTHROID, LEVOTHROID) 125 MCG tablet Take 125 mcg by mouth daily before breakfast.    [provider]  lovastatin (MEVACOR) 20 MG tablet Take 20 mg by mouth at bedtime.    [provider]  ondansetron (ZOFRAN ODT) 4 MG disintegrating tablet 4mg  ODT q4 hours prn nausea/vomit 09/20/14   Cartner, Marland Kitchen, PA-C  tapentadol (NUCYNTA) 50 MG tablet Take 50 mg by mouth every 6 (six) hours as needed for severe pain.    [provider]  tetrahydrozoline-zinc (VISINE-AC) 0.05-0.25 % ophthalmic solution Place 1 drop into both eyes 3 (three) times daily as needed (dry eyes).    [provider]  traZODone  (DESYREL) 50 MG tablet Take 50 mg by mouth at bedtime.     [provider]    Family History Family History  Problem Relation Age of Onset  . Cancer Mother   . Hypertension Brother     Social History Social History   Tobacco Use  . Smoking status: Former Research scientist (life sciences)  . Smokeless tobacco: Never Used  . Tobacco comment: quit years ago - maybe 2002  Substance Use Topics  . Alcohol use: Yes    Comment: occasionally a couple of beers  . Drug use: No     Allergies   Aleve [naproxen sodium], Iohexol, and Naproxen sodium   Review of Systems Review of Systems   Physical Exam Triage Vital Signs ED Triage Vitals  Enc Vitals Group     BP 09/24/18 1504 (!) 128/92     Pulse Rate 09/24/18 1504 (!) 110     Resp 09/24/18 1504 18     Temp 09/24/18 1504 98.4 F (36.9 C)     Temp Source 09/24/18 1504 Oral     SpO2 09/24/18 1504 95 %     Weight --      Height --      Head Circumference --      Peak Flow --      Pain Score 09/24/18 1531 0     Pain Loc --      Pain Edu? --      Excl. in [place]? --    No data found.  Updated Vital Signs BP (!) 128/92 (BP Location: Left Arm)   Pulse (!) 110   Temp 98.4 F (36.9 C) (Oral)   Resp 18   SpO2 95%   Visual Acuity Right Eye Distance:   Left Eye Distance:   Bilateral Distance:    Right Eye Near:   Left Eye Near:    Bilateral Near:     Physical Exam Vitals signs and nursing note reviewed.  Constitutional:      General: She is not in acute distress.    Appearance: Normal appearance. She is not ill-appearing, toxic-appearing or diaphoretic.  HENT:     Head: Normocephalic.     Nose: Nose normal.     Mouth/Throat:     Pharynx: Oropharynx is clear.  Eyes:     Conjunctiva/sclera: Conjunctivae normal.  Neck:     Musculoskeletal: Normal range of motion.  Pulmonary:     Effort: Pulmonary effort is normal.  Musculoskeletal: Normal range of motion.  Skin:    General: Skin is warm and dry.     Findings: No rash.  Neurological:     Mental Status: She is alert.  Psychiatric:        Mood and Affect: Mood normal.      UC Treatments / Results  Labs (all labs ordered are listed, but only abnormal results are displayed) Labs Reviewed - No data to display  EKG   Radiology No results found.  Procedures Procedures (including critical care time)  Medications Ordered in UC Medications - No data to display  Initial Impression / Assessment and Plan / UC Course  I have reviewed the triage vital signs and the nursing notes.  Pertinent labs & imaging results that were available during my care of the patient were reviewed by me and considered in my medical decision making (see chart for details).     Refilled blood pressure medication.  We will have her follow-up with primary care provider on the eighth this plan. Final Clinical Impressions(s) / UC Diagnoses   Final diagnoses:  Medication refill     Discharge Instructions     Refilling her blood pressure medication as requested. Follow-up with your primary care on 8 September as planned    ED Prescriptions    Medication Sig Dispense Auth. Provider   hydrochlorothiazide (HYDRODIURIL) 25 MG tablet Take 1 tablet (25 mg total) by mouth daily. 30 tablet Loura Halt A, NP     Controlled Substance Prescriptions [place] Controlled Substance Registry consulted? Not Applicable   Orvan July, NP 09/25/18 1055

## 2018-10-03 ENCOUNTER — Other Ambulatory Visit: Payer: Self-pay

## 2018-10-03 DIAGNOSIS — Z20822 Contact with and (suspected) exposure to covid-19: Secondary | ICD-10-CM

## 2018-10-04 LAB — NOVEL CORONAVIRUS, NAA: SARS-CoV-2, NAA: NOT DETECTED

## 2018-10-09 ENCOUNTER — Encounter (HOSPITAL_COMMUNITY): Payer: Self-pay

## 2018-10-09 ENCOUNTER — Other Ambulatory Visit: Payer: Self-pay

## 2018-10-09 ENCOUNTER — Ambulatory Visit (HOSPITAL_COMMUNITY)
Admission: EM | Admit: 2018-10-09 | Discharge: 2018-10-09 | Disposition: A | Discharge status: Home / Self Care | Payer: Medicaid Other | Attending: Family Medicine | Admitting: Family Medicine

## 2018-10-09 DIAGNOSIS — E785 Hyperlipidemia, unspecified: Secondary | ICD-10-CM

## 2018-10-09 MED ORDER — SIMVASTATIN 20 MG PO TABS: 20.0000 mg | ORAL_TABLET | Freq: Every day | ORAL | 1 refills | Status: DC

## 2018-10-09 NOTE — ED Triage Notes (Signed)
Patient presents to Urgent Care with complaints of needing her simvastatin refilled since running out last night. Patient reports she has only missed one dose.

## 2018-10-10 NOTE — ED Provider Notes (Signed)
[facility]   [id number] 10/09/18 Arrival Time: 0160  ASSESSMENT & PLAN:  1. Hyperlipidemia, unspecified hyperlipidemia type     Meds ordered this encounter  Medications  . simvastatin (ZOCOR) 20 MG tablet    Sig: Take 1 tablet (20 mg total) by mouth daily.    Dispense:  30 tablet    Refill:  1   Keep f/u with PCP.  Reviewed expectations re: course of current medical issues. Questions answered. Outlined signs and symptoms indicating need for more acute intervention. Patient verbalized understanding. After Visit Summary given.   SUBJECTIVE: History from: patient. Karen Nixon is a 48 y.o. female who presents requesting medication refill; simvastatin. No current concerns. Has appt with new PCP in early September. Reports cholesterol has been stable.  Current medical problems include: Past Medical History:  Diagnosis Date  . Anxiety   . DDD (degenerative disc disease), lumbar   . Degenerative disc disease, lumbar   . Depression    due to chronic pain  . Dislocation of shoulder    x 9  . GERD (gastroesophageal reflux disease)   . Hyperlipemia   . Hypothyroidism   . Left facial numbness 2010   and tingling  and left arm- years ago.  Dr says it was stress  . Scoliosis   . Shortness of breath dyspnea    with exertation  . Thyroid disease   . Umbilical hernia      Current Outpatient Medications (Endocrine & Metabolic):  .  levothyroxine (SYNTHROID, LEVOTHROID) 125 MCG tablet, Take 125 mcg by mouth daily before breakfast.  Current Outpatient Medications (Cardiovascular):  .  hydrochlorothiazide (HYDRODIURIL) 25 MG tablet, Take 1 tablet (25 mg total) by mouth daily. Marland Kitchen  lovastatin (MEVACOR) 20 MG tablet, Take 20 mg by mouth at bedtime. .  simvastatin (ZOCOR) 20 MG tablet, Take 1 tablet (20 mg total) by mouth daily.   Current Outpatient Medications (Analgesics):  .  ibuprofen (ADVIL,MOTRIN) 800 MG tablet, Take 1 tablet (800 mg total) by mouth every 6 (six)  hours as needed for mild pain or moderate pain. .  tapentadol (NUCYNTA) 50 MG tablet, Take 50 mg by mouth every 6 (six) hours as needed for severe pain.   Current Outpatient Medications (Other):  .  baclofen (LIORESAL) 10 MG tablet, Take 10 mg by mouth 3 (three) times daily. .  Ca Phosphate-Cholecalciferol (CALCIUM/VITAMIN D3 GUMMIES) 200-200 MG-UNIT CHEW, Chew 1 tablet by mouth daily.  .  calcium carbonate (TUMS - DOSED IN MG ELEMENTAL CALCIUM) 500 MG chewable tablet, Chew 3 tablets by mouth daily as needed for indigestion or heartburn. .  gabapentin (NEURONTIN) 300 MG capsule, Take 300 mg by mouth 3 (three) times daily. .  ondansetron (ZOFRAN ODT) 4 MG disintegrating tablet, 4mg  ODT q4 hours prn nausea/vomit .  tetrahydrozoline-zinc (VISINE-AC) 0.05-0.25 % ophthalmic solution, Place 1 drop into both eyes 3 (three) times daily as needed (dry eyes). .  traZODone (DESYREL) 50 MG tablet, Take 50 mg by mouth at bedtime.  No current facility-administered medications for this encounter.    For Reqested Medication Refill: Reason for request:  clinic/provider not available Medications taken before: yes - see home medications   Patient has complete original prescription information: Yes   ROS: As per HPI.   OBJECTIVE:  Vitals:   10/09/18 1851  BP: 123/79  Pulse: (!) 103  Resp: 17  Temp: 98.7 F (37.1 C)  TempSrc: Oral  SpO2: 98%    General appearance: alert; no distress Eyes: PERRLA; EOMI;  conjunctiva normal Neck: supple  Lungs: clear to auscultation bilaterally Heart: slight tachycardia; regular Extremities: no cyanosis or edema; symmetrical with no gross deformities Skin: warm and dry Neurologic: normal gait Psychological: alert and cooperative; normal mood and affect   Allergies  Allergen Reactions  . Aleve [Naproxen Sodium]   . Iohexol      Code: HIVES, Desc: Pt developed hives on neck, face and back with itching. Given 50mg  oral benadryl., Onset Date: [id number]   .  Naproxen Sodium     REACTION: swelling per pt    Social History   Socioeconomic History  . Marital status: Single    Spouse name: Not on file  . Number of children: Not on file  . Years of education: Not on file  . Highest education level: Not on file  Occupational History  . Not on file  Social Needs  . Financial resource strain: Not on file  . Food insecurity    Worry: Not on file    Inability: Not on file  . Transportation needs    Medical: Not on file    Non-medical: Not on file  Tobacco Use  . Smoking status: Current Every Day Smoker    Types: Cigarettes  . Smokeless tobacco: Never Used  . Tobacco comment: maybe 2 cigarettes per day  Substance and Sexual Activity  . Alcohol use: Yes    Comment: occasionally a couple of beers  . Drug use: No  . Sexual activity: Never  Lifestyle  . Physical activity    Days per week: Not on file    Minutes per session: Not on file  . Stress: Not on file  Relationships  . Social Herbalist on phone: Not on file    Gets together: Not on file    Attends religious service: Not on file    Active member of club or organization: Not on file    Attends meetings of clubs or organizations: Not on file    Relationship status: Not on file  . Intimate partner violence    Fear of current or ex partner: Not on file    Emotionally abused: Not on file    Physically abused: Not on file    Forced sexual activity: Not on file  Other Topics Concern  . Not on file  Social History Narrative  . Not on file   Family History  Problem Relation Age of Onset  . Cancer Mother   . Hypertension Brother    Past Surgical History:  Procedure Laterality Date  . BACK SURGERY  2005  . CERVICAL CONIZATION W/BX    . ELBOW SURGERY Right 2007   fracture repair with screws  . INDUCED ABORTION    . SHOULDER ARTHROSCOPY Right 09/09/2014   Procedure: RIGHT SHOULDER ARTHROSCOPY WITH LABRAL REPAIR;  Surgeon: Mcarthur Rossetti, MD;  Location: [facility];  Service: Orthopedics;  Laterality: Right;  . SHOULDER SURGERY Right 2001   arthroscopy  . TUBAL LIGATION  11/20/1996     Vanessa Kick, MD 10/10/18 [phone]

## 2019-01-22 ENCOUNTER — Ambulatory Visit (HOSPITAL_COMMUNITY)
Admission: EM | Admit: 2019-01-22 | Discharge: 2019-01-22 | Disposition: A | Discharge status: Home / Self Care | Payer: Medicaid Other | Attending: Family Medicine | Admitting: Family Medicine

## 2019-01-22 ENCOUNTER — Other Ambulatory Visit: Payer: Self-pay

## 2019-01-22 ENCOUNTER — Encounter (HOSPITAL_COMMUNITY): Payer: Self-pay

## 2019-01-22 DIAGNOSIS — Z760 Encounter for issue of repeat prescription: Secondary | ICD-10-CM | POA: Diagnosis not present

## 2019-01-22 DIAGNOSIS — G894 Chronic pain syndrome: Secondary | ICD-10-CM

## 2019-01-22 MED ORDER — DULOXETINE HCL 60 MG PO CPEP: 60.0000 mg | ORAL_CAPSULE | Freq: Every day | ORAL | 1 refills | Status: DC

## 2019-01-22 MED ORDER — GABAPENTIN 300 MG PO CAPS: 300.0000 mg | ORAL_CAPSULE | Freq: Three times a day (TID) | ORAL | 1 refills | Status: AC

## 2019-01-22 MED ORDER — LEVOTHYROXINE SODIUM 100 MCG PO TABS: 100.0000 ug | ORAL_TABLET | Freq: Every day | ORAL | 1 refills | Status: AC

## 2019-01-22 MED ORDER — SIMVASTATIN 20 MG PO TABS: 20.0000 mg | ORAL_TABLET | Freq: Every day | ORAL | 1 refills | Status: DC

## 2019-01-22 MED ORDER — HYDROCHLOROTHIAZIDE 25 MG PO TABS: 25.0000 mg | ORAL_TABLET | Freq: Every day | ORAL | 1 refills | Status: DC

## 2019-01-22 NOTE — Discharge Instructions (Addendum)
Call for problems I am so sorry for your loss

## 2019-01-22 NOTE — ED Triage Notes (Signed)
Patient presents to Urgent Care with complaints of needing medication refilled (synthroid, HCTZ, simvastatin, gabapentin, and cymbalta) since her PCP appt was moved to December. Patient reports she is completely out of them.

## 2019-01-22 NOTE — ED Provider Notes (Signed)
[place]    CSN: [id number] Arrival date & time: 01/22/19  1309      History   Chief Complaint Chief Complaint  Patient presents with  . Medication Refill    HPI Karen Nixon is a 48 y.o. female.   HPI  Patient has changed primary care doctors and has had difficulty getting an appointment.  Because of coronavirus and other circumstances she has been unable to keep her appointment, and its been put off until December.  She is now out of a few of her medications and needs them for ongoing management until she can be seen December 14.  She requests: Duloxetine, gabapentin, hydrochlorothiazide, levothyroxine, simvastatin.  She is stable on all of these medications over time and is followed on a regular basis. Patient is grieving today and tearful because of loss of a cousin.  Past Medical History:  Diagnosis Date  . Anxiety   . DDD (degenerative disc disease), lumbar   . Degenerative disc disease, lumbar   . Depression    due to chronic pain  . Dislocation of shoulder    x 9  . GERD (gastroesophageal reflux disease)   . Hyperlipemia   . Hypothyroidism   . Left facial numbness 2010   and tingling  and left arm- years ago.  Dr says it was stress  . Scoliosis   . Shortness of breath dyspnea    with exertation  . Thyroid disease   . Umbilical hernia     Patient Active Problem List   Diagnosis Date Noted  . Syncope 04/18/2016  . Chronic pain 04/18/2016  . 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 BREAST MASS, LEFT 09/26/2007  . RHINITIS, ALLERGIC NOS 10/26/2006  . [place] DISEASE 10/24/2006  . Hypothyroidism 10/24/2006  . HYPERTENSION 10/24/2006  . HX, PERSONAL, CERVICAL DYSPLASIA 10/24/2006    Past Surgical History:  Procedure Laterality Date  . BACK SURGERY  2005  . CERVICAL CONIZATION W/BX    . ELBOW SURGERY Right 2007   fracture repair with screws  . INDUCED ABORTION    . SHOULDER ARTHROSCOPY Right 09/09/2014   Procedure: RIGHT  SHOULDER ARTHROSCOPY WITH LABRAL REPAIR;  Surgeon: Mcarthur Rossetti, MD;  Location: [facility];  Service: Orthopedics;  Laterality: Right;  . SHOULDER SURGERY Right 2001   arthroscopy  . TUBAL LIGATION  11/20/1996    OB History    Gravida  3   Para  2   Term  2   Preterm      AB  1   Living  2     SAB      TAB      Ectopic      Multiple      Live Births               Home Medications    Prior to Admission medications   Medication Sig Start Date End Date Taking? Authorizing Provider  Ca Phosphate-Cholecalciferol (CALCIUM/VITAMIN D3 GUMMIES) 200-200 MG-UNIT CHEW Chew 1 tablet by mouth daily.     [provider]  calcium carbonate (TUMS - DOSED IN MG ELEMENTAL CALCIUM) 500 MG chewable tablet Chew 3 tablets by mouth daily as needed for indigestion or heartburn.    [provider]  DULoxetine (CYMBALTA) 60 MG capsule Take 1 capsule (60 mg total) by mouth daily. 01/22/19   Raylene Everts, MD  gabapentin (NEURONTIN) 300 MG capsule Take 1 capsule (300 mg total) by mouth 3 (three) times daily. 01/22/19  Raylene Everts, MD  hydrochlorothiazide (HYDRODIURIL) 25 MG tablet Take 1 tablet (25 mg total) by mouth daily. 01/22/19   Raylene Everts, MD  ibuprofen (ADVIL,MOTRIN) 800 MG tablet Take 1 tablet (800 mg total) by mouth every 6 (six) hours as needed for mild pain or moderate pain. 04/18/16   Leo Grosser, MD  levothyroxine (SYNTHROID) 100 MCG tablet Take 1 tablet (100 mcg total) by mouth daily before breakfast. 01/22/19   Raylene Everts, MD  simvastatin (ZOCOR) 20 MG tablet Take 1 tablet (20 mg total) by mouth daily. 01/22/19   Raylene Everts, MD  lovastatin (MEVACOR) 20 MG tablet Take 20 mg by mouth at bedtime.  01/22/19  [provider]  traZODone (DESYREL) 50 MG tablet Take 50 mg by mouth at bedtime.   01/22/19  [provider]    Family History Family History  Problem Relation Age of Onset  . Cancer Mother   .  Hypertension Brother     Social History Social History   Tobacco Use  . Smoking status: Former Smoker    Types: Cigarettes    Quit date: 11/22/2018    Years since quitting: 0.1  . Smokeless tobacco: Never Used  Substance Use Topics  . Alcohol use: Not Currently    Comment: occasionally a couple of beers  . Drug use: No     Allergies   Aleve [naproxen sodium], Iohexol, and Naproxen sodium   Review of Systems Review of Systems  Constitutional: Negative for chills and fever.  HENT: Negative for ear pain and sore throat.   Eyes: Negative for pain and visual disturbance.  Respiratory: Negative for cough and shortness of breath.   Cardiovascular: Negative for chest pain and palpitations.  Gastrointestinal: Negative for abdominal pain and vomiting.  Genitourinary: Negative for dysuria and hematuria.  Musculoskeletal: Positive for back pain and gait problem. Negative for arthralgias.       Chronic pain  Skin: Negative for color change and rash.  Neurological: Negative for seizures and syncope.  Psychiatric/Behavioral:       Grieving  All other systems reviewed and are negative.    Physical Exam Triage Vital Signs ED Triage Vitals  Enc Vitals Group     BP 01/22/19 1411 (!) 149/99     Pulse Rate 01/22/19 1411 91     Resp 01/22/19 1411 15     Temp 01/22/19 1411 98.2 F (36.8 C)     Temp Source 01/22/19 1411 Oral     SpO2 01/22/19 1411 97 %     Weight --      Height --      Head Circumference --      Peak Flow --      Pain Score 01/22/19 1410 0     Pain Loc --      Pain Edu? --      Excl. in [place]? --    No data found.  Updated Vital Signs BP (!) 149/99 (BP Location: Right Arm)   Pulse 91   Temp 98.2 F (36.8 C) (Oral)   Resp 15   SpO2 97%      Physical Exam Constitutional:      General: She is not in acute distress.    Appearance: She is well-developed.     Comments: Stooped posture.  Antalgic gait.  Obviously uncomfortable.  Tearful  HENT:     Head:  Normocephalic and atraumatic.     Mouth/Throat:     Comments: mask Eyes:  Conjunctiva/sclera: Conjunctivae normal.     Pupils: Pupils are equal, round, and reactive to light.  Neck:     Musculoskeletal: Normal range of motion.  Cardiovascular:     Rate and Rhythm: Normal rate.  Pulmonary:     Effort: Pulmonary effort is normal. No respiratory distress.  Abdominal:     General: There is no distension.     Palpations: Abdomen is soft.  Musculoskeletal: Normal range of motion.  Skin:    General: Skin is warm and dry.  Neurological:     Mental Status: She is alert.      UC Treatments / Results  Labs (all labs ordered are listed, but only abnormal results are displayed) Labs Reviewed - No data to display  EKG   Radiology No results found.  Procedures Procedures (including critical care time)  Medications Ordered in UC Medications - No data to display  Initial Impression / Assessment and Plan / UC Course  I have reviewed the triage vital signs and the nursing notes.  Pertinent labs & imaging results that were available during my care of the patient were reviewed by me and considered in my medical decision making (see chart for details).      Final Clinical Impressions(s) / UC Diagnoses   Final diagnoses:  Chronic pain syndrome  Encounter for medication refill     Discharge Instructions     Call for problems I am so sorry for your loss    ED Prescriptions    Medication Sig Dispense Auth. Provider   gabapentin (NEURONTIN) 300 MG capsule Take 1 capsule (300 mg total) by mouth 3 (three) times daily. 90 capsule Raylene Everts, MD   hydrochlorothiazide (HYDRODIURIL) 25 MG tablet Take 1 tablet (25 mg total) by mouth daily. 30 tablet Raylene Everts, MD   levothyroxine (SYNTHROID) 100 MCG tablet Take 1 tablet (100 mcg total) by mouth daily before breakfast. 30 tablet Raylene Everts, MD   simvastatin (ZOCOR) 20 MG tablet Take 1 tablet (20 mg total)  by mouth daily. 30 tablet Raylene Everts, MD   DULoxetine (CYMBALTA) 60 MG capsule Take 1 capsule (60 mg total) by mouth daily. 30 capsule Raylene Everts, MD     PDMP not reviewed this encounter.   Raylene Everts, MD 01/22/19 [phone]

## 2019-02-11 DIAGNOSIS — E782 Mixed hyperlipidemia: Secondary | ICD-10-CM | POA: Insufficient documentation

## 2019-02-11 DIAGNOSIS — F329 Major depressive disorder, single episode, unspecified: Secondary | ICD-10-CM | POA: Insufficient documentation

## 2019-02-14 DIAGNOSIS — N183 Chronic kidney disease, stage 3 unspecified: Secondary | ICD-10-CM | POA: Insufficient documentation

## 2019-03-07 DIAGNOSIS — E1165 Type 2 diabetes mellitus with hyperglycemia: Secondary | ICD-10-CM | POA: Insufficient documentation

## 2019-03-12 DIAGNOSIS — K432 Incisional hernia without obstruction or gangrene: Secondary | ICD-10-CM | POA: Insufficient documentation

## 2019-03-25 DIAGNOSIS — M51379 Other intervertebral disc degeneration, lumbosacral region without mention of lumbar back pain or lower extremity pain: Secondary | ICD-10-CM | POA: Insufficient documentation

## 2019-03-25 DIAGNOSIS — M5137 Other intervertebral disc degeneration, lumbosacral region: Secondary | ICD-10-CM | POA: Insufficient documentation

## 2019-03-25 DIAGNOSIS — M5136 Other intervertebral disc degeneration, lumbar region: Secondary | ICD-10-CM | POA: Insufficient documentation

## 2019-03-29 ENCOUNTER — Encounter: Payer: Self-pay | Admitting: Physical Therapy

## 2019-03-29 ENCOUNTER — Other Ambulatory Visit: Payer: Self-pay

## 2019-03-29 ENCOUNTER — Ambulatory Visit: Payer: Medicaid Other | Attending: Anesthesiology | Admitting: Physical Therapy

## 2019-03-29 DIAGNOSIS — M5441 Lumbago with sciatica, right side: Secondary | ICD-10-CM | POA: Diagnosis present

## 2019-03-29 DIAGNOSIS — M5136 Other intervertebral disc degeneration, lumbar region: Secondary | ICD-10-CM

## 2019-03-29 DIAGNOSIS — M6281 Muscle weakness (generalized): Secondary | ICD-10-CM | POA: Diagnosis present

## 2019-03-29 DIAGNOSIS — M5442 Lumbago with sciatica, left side: Secondary | ICD-10-CM | POA: Diagnosis present

## 2019-03-29 DIAGNOSIS — R262 Difficulty in walking, not elsewhere classified: Secondary | ICD-10-CM | POA: Diagnosis present

## 2019-03-29 DIAGNOSIS — R293 Abnormal posture: Secondary | ICD-10-CM | POA: Diagnosis present

## 2019-03-29 DIAGNOSIS — G8929 Other chronic pain: Secondary | ICD-10-CM

## 2019-03-29 NOTE — Therapy (Signed)
[place], [place], [address] Phone: [phone]   Fax:  [phone]  Physical Therapy Evaluation  Patient Details  Name: Karen Nixon MRN: [id number] Date of Birth: [birth year] Referring Provider (PT): Dorene Ar, MD   Encounter Date: 03/29/2019  PT End of Session - 03/29/19 1009    Visit Number  1    Number of Visits  4    Date for PT Re-Evaluation  04/26/19    Authorization Type  Medicaid-requesting initial 3 visits    PT Start Time  65       Past Medical History:  Diagnosis Date  . Anxiety   . DDD (degenerative disc disease), lumbar   . Degenerative disc disease, lumbar   . Depression    due to chronic pain  . Diabetes mellitus without complication ([place])   . Dislocation of shoulder    x 9  . GERD (gastroesophageal reflux disease)   . Hyperlipemia   . Hypothyroidism   . Left facial numbness 2010   and tingling  and left arm- years ago.  Dr says it was stress  . Scoliosis   . Shortness of breath dyspnea    with exertation  . Thyroid disease   . Umbilical hernia     Past Surgical History:  Procedure Laterality Date  . BACK SURGERY  2005  . CERVICAL CONIZATION W/BX    . ELBOW SURGERY Right 2007   fracture repair with screws  . INDUCED ABORTION    . SHOULDER ARTHROSCOPY Right 09/09/2014   Procedure: RIGHT SHOULDER ARTHROSCOPY WITH LABRAL REPAIR;  Surgeon: Mcarthur Rossetti, MD;  Location: [facility];  Service: Orthopedics;  Laterality: Right;  . SHOULDER SURGERY Right 2001   arthroscopy  . TUBAL LIGATION  11/20/1996    There were no vitals filed for this visit.   Subjective Assessment - 03/29/19 1003    Subjective  Pt. is a 49 y/o female referred to PT with c/o chronic LBP with bilateral LE radiating pain with most recent flare up this past Spring. She has a history of lumbar surgery for left L3-S1 hemilaminectomy in 2005. Pain is of insidious onset. Pt. has left foot drop  symptoms with reported onset following drug overdose 2018. She does not have AFO-she reports brace has been suggested but has been unable to get due to finanicial reasons.  Last MRI was in 2018-see chart copy of report. She reports is potentially pending lumbar fusion surgery but reports has been unable to have due to elective procedures cancelled due to Covid.    Pertinent History  left L3-S1 hemilaminectomy 2018, diabetic, HTN, ventral hernia, anxiety/depression, chronic pain in pain management    Limitations  Sitting;House hold activities;Lifting;Standing;Walking    Diagnostic tests  Recent MRI 03/14/19 but no copy of report available, past MRI 2018-see chart copy of report    Patient Stated Goals  Improve range of motion, relieve pain    Currently in Pain?  Yes    Pain Score  2     Pain Location  Back    Pain Orientation  Lower;Left    Pain Descriptors / Indicators  --   "pinching"   Pain Type  Chronic pain    Pain Radiating Towards  bilat. LE left > right with left sided symptoms to foot, right distally to hip    Pain Onset  More than a month ago    Pain Frequency  Intermittent    Aggravating Factors   range  of motion for back, "manipulation"/movement of back, walking    Pain Relieving Factors  lying prone, rest    Effect of Pain on Daily Activities  limits positional and walking tolerance, ability ADLs         [facility] PT Assessment - 03/29/19 0001      Assessment   Medical Diagnosis  Lumbar DDD/spondylosis    Referring Provider (PT)  Dorene Ar, MD    Onset Date/Surgical Date  --   chronic-surgical history 2005 with last flare up 4/20   Hand Dominance  Right    Next MD Visit  04/11/2019    Prior Therapy  past PT for back 2005      Precautions   Precaution Comments  no dry needling to left lumbar multifidi due to history left L3-S1 hemilaminectomy, caution due to hernia-avoid abdominal strain with core strengthening      Restrictions   Weight Bearing Restrictions  No       Balance Screen   Has the patient fallen in the past 6 months  No      [place] residence    Living Arrangements  Alone    Type of [place] to enter    Entrance Stairs-Number of Steps  --   2 to porch, 1 from door, no rail   Home Layout  Two level    Alternate Level Stairs-Number of Steps  14   2 sets of 7 stairs   Alternate Level [place] - 2 wheels;Cane - single point      Prior Function   Level of Independence  Independent with basic ADLs      Sensation   Light Touch  Appears Intact      Posture/Postural Control   Posture Comments  Scoliosis convex in right thoracic,dextroscoliosis, left lumbar levoscoliosis      ROM / Strength   AROM / PROM / Strength  AROM;PROM;Strength      AROM   Overall AROM Comments  Bilat. hip AROM/PROM grossly WFL    AROM Assessment Site  Ankle;Lumbar    Right/Left Ankle  Right;Left    Right Ankle Dorsiflexion  5    Left Ankle Dorsiflexion  -20    Lumbar Flexion  100    Lumbar Extension  10    Lumbar - Right Side Bend  20    Lumbar - Left Side Bend  25    Lumbar - Right Rotation  40%    Lumbar - Left Rotation  30%      PROM   Overall PROM Comments  left ankle passove DF to neutral/0 deg      Strength   Strength Assessment Site  Hip;Knee;Ankle    Right/Left Hip  Right;Left    Right Hip Flexion  4-/5    Right Hip External Rotation   4/5    Right Hip Internal Rotation  4+/5    Left Hip Flexion  4-/5    Left Hip External Rotation  4/5    Left Hip Internal Rotation  4+/5    Right/Left Knee  Right;Left    Right Knee Flexion  4/5    Right Knee Extension  4+/5    Left Knee Flexion  4/5    Left Knee Extension  4/5    Right/Left Ankle  Right;Left    Right Ankle Inversion  4+/5    Right  Ankle Eversion  4/5    Left Ankle Dorsiflexion  4+/5      Flexibility   Soft Tissue Assessment /Muscle Length  --   SLR 90 deg bilat.     Palpation    Palpation comment  hypertonicity with significant tenderness to palpation left lumbar paraspinals      Ambulation/Gait   Gait Comments  steppage gait for left tow clearance due to foot drop                Objective measurements completed on examination: See above findings.      [place] Adult PT Treatment/Exercise - 03/29/19 0001      Exercises   Exercises  --   HEP handout review-see chart copy                 PT Long Term Goals - 03/29/19 1121      PT LONG TERM GOAL #1   Title  Independent with HEP    Baseline  needs HEP    Time  4    Period  Weeks    Status  New    Target Date  04/26/19      PT LONG TERM GOAL #2   Title  Return demos for body mechanics for lifting technique and bed mobility to minimize lumbar strain    Baseline  would benefit from instruction    Time  4    Period  Weeks    Status  New    Target Date  04/26/19      PT LONG TERM GOAL #3   Title  Increase left ankle DF AROM/PROM at least 5 deg to improve toe clearance for gait    Baseline  -20 deg AROM, 0 deg PROM    Time  4    Period  Weeks    Status  New    Target Date  04/26/19      PT LONG TERM GOAL #4   Title  Further goals to be set for strength and decreased pain/functional status pending insurance approval after initial 3 visits             Plan - 03/29/19 1007    Clinical Impression Statement  Pt. presents with chronic LBP with left>right LE radicular symptoms currently unable to have fusion surgery due to elective procedures cancelled due to Covid. Findings include core and LE weakness, left foot drop, muscle hypertonicity left lumbar paraspinals also with slight extension bias ROM preferring prone vs. supine positioning. Pt. would benefit from PT to help relieve pain and improve functional status for mobility.    Personal Factors and Comorbidities  Comorbidity 3+    Comorbidities  chronic pain and surgical history for lumbar region, hernia, left dropfoot,  diabetic, history anxiety/depression    Examination-Activity Limitations  Sit;Transfers;Sleep;Lift;Squat;Bend;Locomotion Level;Stairs;Stand    Examination-Participation Restrictions  Community Activity;Shop;Cleaning    Stability/Clinical Decision Making  Evolving/Moderate complexity    Clinical Decision Making  Moderate    Rehab Potential  Fair    PT Frequency  1x / week    PT Duration  4 weeks    PT Treatment/Interventions  Dry needling;ADLs/Self Care Home Management;Cryotherapy;Traction;Ultrasound;Electrical Stimulation;Moist Heat;Therapeutic activities;Functional mobility training;Therapeutic exercise;Neuromuscular re-education;Manual techniques;Patient/family education    PT Next Visit Plan  no dry needling left lumbar multifidi due to history laminectomy and caution/avoid abdominal strain due to hernia, plan trial dry needling, review HEP as needed and progress gentle core strengthening as tolerated    PT Home Exercise Plan  prone TA contraction, pelvic tilt, hip adduction isometrics, hip bridge, longsitting gastroc stretch    Consulted and Agree with Plan of Care  Patient       Patient will benefit from skilled therapeutic intervention in order to improve the following deficits and impairments:  Decreased activity tolerance, Decreased strength, Impaired flexibility, Postural dysfunction, Decreased range of motion, Increased muscle spasms, Difficulty walking, Abnormal gait  Visit Diagnosis: Degeneration of lumbar intervertebral disc  Chronic left-sided low back pain with bilateral sciatica  Muscle weakness (generalized)  Difficulty in walking, not elsewhere classified  Abnormal posture     Problem List Patient Active Problem List   Diagnosis Date Noted  . Syncope 04/18/2016  . Chronic pain 04/18/2016  . 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 BREAST MASS, LEFT 09/26/2007  . RHINITIS, ALLERGIC NOS 10/26/2006  . [place] DISEASE 10/24/2006  . Hypothyroidism  10/24/2006  . HYPERTENSION 10/24/2006  . HX, PERSONAL, CERVICAL DYSPLASIA 10/24/2006    Beaulah Dinning, PT, DPT 03/29/19 11:37 AM  [facility] [address] [place], [place], [address] Phone: [phone]   Fax:  [phone]  Name: Karen Nixon MRN: [id number] Date of Birth: 10-14-1970

## 2019-04-10 ENCOUNTER — Other Ambulatory Visit: Payer: Self-pay

## 2019-04-10 ENCOUNTER — Ambulatory Visit: Payer: Medicaid Other | Attending: Anesthesiology | Admitting: Physical Therapy

## 2019-04-10 DIAGNOSIS — M6281 Muscle weakness (generalized): Secondary | ICD-10-CM | POA: Diagnosis present

## 2019-04-10 DIAGNOSIS — M5136 Other intervertebral disc degeneration, lumbar region: Secondary | ICD-10-CM | POA: Insufficient documentation

## 2019-04-10 DIAGNOSIS — M5441 Lumbago with sciatica, right side: Secondary | ICD-10-CM | POA: Insufficient documentation

## 2019-04-10 DIAGNOSIS — M5442 Lumbago with sciatica, left side: Secondary | ICD-10-CM | POA: Insufficient documentation

## 2019-04-10 DIAGNOSIS — G8929 Other chronic pain: Secondary | ICD-10-CM | POA: Insufficient documentation

## 2019-04-10 DIAGNOSIS — R262 Difficulty in walking, not elsewhere classified: Secondary | ICD-10-CM

## 2019-04-10 DIAGNOSIS — R293 Abnormal posture: Secondary | ICD-10-CM | POA: Insufficient documentation

## 2019-04-11 ENCOUNTER — Encounter: Payer: Self-pay | Admitting: Physical Therapy

## 2019-04-11 NOTE — Therapy (Signed)
[place], [place], [address] Phone: [phone]   Fax:  [phone]  Physical Therapy Treatment  Patient Details  Name: Karen Nixon MRN: [id number] Date of Birth: 24-Dec-1970 Referring Provider (PT): Dorene Ar, MD   Encounter Date: 04/10/2019  PT End of Session - 04/11/19 1219    Visit Number  2    Number of Visits  4    Date for PT Re-Evaluation  04/26/19    Authorization Type  Medicaid-requesting initial 3 visits    PT Start Time  1500    PT Stop Time  1543    PT Time Calculation (min)  43 min    Activity Tolerance  Patient tolerated treatment well    Behavior During Therapy  [facility] for tasks assessed/performed       Past Medical History:  Diagnosis Date  . Anxiety   . DDD (degenerative disc disease), lumbar   . Degenerative disc disease, lumbar   . Depression    due to chronic pain  . Diabetes mellitus without complication ([place])   . Dislocation of shoulder    x 9  . GERD (gastroesophageal reflux disease)   . Hyperlipemia   . Hypothyroidism   . Left facial numbness 2010   and tingling  and left arm- years ago.  Dr says it was stress  . Scoliosis   . Shortness of breath dyspnea    with exertation  . Thyroid disease   . Umbilical hernia     Past Surgical History:  Procedure Laterality Date  . BACK SURGERY  2005  . CERVICAL CONIZATION W/BX    . ELBOW SURGERY Right 2007   fracture repair with screws  . INDUCED ABORTION    . SHOULDER ARTHROSCOPY Right 09/09/2014   Procedure: RIGHT SHOULDER ARTHROSCOPY WITH LABRAL REPAIR;  Surgeon: Mcarthur Rossetti, MD;  Location: [facility];  Service: Orthopedics;  Laterality: Right;  . SHOULDER SURGERY Right 2001   arthroscopy  . TUBAL LIGATION  11/20/1996    There were no vitals filed for this visit.  Subjective Assessment - 04/11/19 1213    Subjective  Patient reports that she is having spasming today. She has tried her stretches and exercises.  They help a little.    Pertinent History  left L3-S1 hemilaminectomy 2018, diabetic, HTN, ventral hernia, anxiety/depression, chronic pain in pain management    Limitations  Sitting;House hold activities;Lifting;Standing;Walking    Diagnostic tests  Recent MRI 03/14/19 but no copy of report available, past MRI 2018-see chart copy of report    Patient Stated Goals  Improve range of motion, relieve pain    Currently in Pain?  Yes    Pain Score  6     Pain Location  Back    Pain Orientation  Left    Pain Descriptors / Indicators  Aching;Spasm    Pain Type  Chronic pain    Pain Onset  More than a month ago    Pain Frequency  Intermittent    Aggravating Factors   bending; walking    Pain Relieving Factors  lying prone, rest    Effect of Pain on Daily Activities  limitis ability to walk                       [facility] Adult PT Treatment/Exercise - 04/11/19 0001      Ambulation/Gait   Gait Comments  ambualting with a cane: cuing for gait technique. Patient advised to keep  a normal base of support and dont over step. With practice her stride improved       Lumbar Exercises: Seated   Other Seated Lumbar Exercises  LAQ 2x10 with cuing for range; seated calmshell 2x10 yellow;seated ball squeeze 2x10 with cuing for posture and abdominal breathing for all.       Manual Therapy   Manual Therapy  Soft tissue mobilization;Joint mobilization;Manual Traction    Manual therapy comments  skilled palpation of trigger points     Soft tissue mobilization  to left gluteal and lumbar spine using manual trigger point and gluteal     Manual Traction  light grade II and III ocilations on each leg       Trigger Point Dry Needling - 04/11/19 0001    Consent Given?  Yes    Education Handout Provided  Yes    Muscles Treated Back/Hip  Gluteus medius;Gluteus maximus    Other Dry Needling  3x to left gluteal using a 30x75            PT Education - 04/11/19 1216    Education Details  reviewed  exercises; beneftis sand risks of tpdn    Person(s) Educated  Patient    Methods  Explanation;Demonstration;Tactile cues;Verbal cues    Comprehension  Returned demonstration;Verbalized understanding;Verbal cues required;Tactile cues required          PT Long Term Goals - 04/11/19 1228      PT LONG TERM GOAL #1   Title  Independent with HEP    Baseline  needs HEP    Time  4    Period  Weeks    Status  New      PT LONG TERM GOAL #2   Title  Return demos for body mechanics for lifting technique and bed mobility to minimize lumbar strain    Baseline  would benefit from instruction    Time  4    Period  Weeks    Status  On-going      PT LONG TERM GOAL #3   Title  Increase left ankle DF AROM/PROM at least 5 deg to improve toe clearance for gait    Baseline  -20 deg AROM, 0 deg PROM    Time  4    Period  Weeks    Status  New            Plan - 04/11/19 1220    Clinical Impression Statement  Patient was tender to palpation in the left gluteal. She had a goot response to needling. She also had a reduction in pain with light LAD with occilations. The patient was not kept in any position for too long. Therapy reviewed cane use. She hasa cane but does not liketo do it. She is having difficulty weight bearing onthe left LE. With the cane she has much less lateral movement when she iuses the cane. She was encouraged to use it now to decrease pain so we can strengthen as much as she can before her surgery. She perfromed general left LE strengthening in sitting.    Personal Factors and Comorbidities  Comorbidity 3+    Comorbidities  chronic pain and surgical history for lumbar region, hernia, left dropfoot, diabetic, history anxiety/depression    Examination-Activity Limitations  Sit;Transfers;Sleep;Lift;Squat;Bend;Locomotion Level;Stairs;Stand    Examination-Participation Restrictions  Community Activity;Shop;Cleaning    Stability/Clinical Decision Making  Evolving/Moderate complexity     Clinical Decision Making  Moderate    Rehab Potential  Fair    PT  Duration  4 weeks    PT Treatment/Interventions  Dry needling;ADLs/Self Care Home Management;Cryotherapy;Traction;Ultrasound;Electrical Stimulation;Moist Heat;Therapeutic activities;Functional mobility training;Therapeutic exercise;Neuromuscular re-education;Manual techniques;Patient/family education    PT Next Visit Plan  no dry needling left lumbar multifidi due to history laminectomy and caution/avoid abdominal strain due to hernia, plan trial dry needling, review HEP as needed and progress gentle core strengthening as tolerated    PT Home Exercise Plan  prone TA contraction, pelvic tilt, hip adduction isometrics, hip bridge, longsitting gastroc stretch    Consulted and Agree with Plan of Care  Patient       Patient will benefit from skilled therapeutic intervention in order to improve the following deficits and impairments:  Decreased activity tolerance, Decreased strength, Impaired flexibility, Postural dysfunction, Decreased range of motion, Increased muscle spasms, Difficulty walking, Abnormal gait  Visit Diagnosis: Degeneration of lumbar intervertebral disc  Chronic left-sided low back pain with bilateral sciatica  Muscle weakness (generalized)  Difficulty in walking, not elsewhere classified  Abnormal posture     Problem List Patient Active Problem List   Diagnosis Date Noted  . Syncope 04/18/2016  . Chronic pain 04/18/2016  . 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 BREAST MASS, LEFT 09/26/2007  . RHINITIS, ALLERGIC NOS 10/26/2006  . [place] DISEASE 10/24/2006  . Hypothyroidism 10/24/2006  . HYPERTENSION 10/24/2006  . HX, PERSONAL, CERVICAL DYSPLASIA 10/24/2006    Carney Living PT DPT  04/11/2019, 12:39 PM  [facility] [address] [place], [place], [address] Phone: [phone]   Fax:  [phone]  Name: Karen Nixon MRN:  [id number] Date of Birth: 02-22-1971

## 2019-04-18 ENCOUNTER — Ambulatory Visit: Payer: Medicaid Other | Admitting: Physical Therapy

## 2019-04-25 ENCOUNTER — Encounter: Payer: Self-pay | Admitting: Physical Therapy

## 2019-04-25 ENCOUNTER — Ambulatory Visit: Payer: Medicaid Other | Admitting: Physical Therapy

## 2019-04-25 ENCOUNTER — Other Ambulatory Visit: Payer: Self-pay

## 2019-04-25 DIAGNOSIS — M5136 Other intervertebral disc degeneration, lumbar region: Secondary | ICD-10-CM

## 2019-04-25 DIAGNOSIS — M5441 Lumbago with sciatica, right side: Secondary | ICD-10-CM

## 2019-04-25 DIAGNOSIS — R293 Abnormal posture: Secondary | ICD-10-CM

## 2019-04-25 DIAGNOSIS — G8929 Other chronic pain: Secondary | ICD-10-CM

## 2019-04-25 DIAGNOSIS — M6281 Muscle weakness (generalized): Secondary | ICD-10-CM

## 2019-04-25 DIAGNOSIS — R262 Difficulty in walking, not elsewhere classified: Secondary | ICD-10-CM

## 2019-04-25 NOTE — Therapy (Signed)
[place], [place], [address] Phone: [phone]   Fax:  [phone]  Physical Therapy Treatment/Discharge  Patient Details  Name: Karen Nixon MRN: [id number] Date of Birth: 19-Sep-1970 Referring Provider (PT): Dorene Ar, MD   Encounter Date: 04/25/2019  PT End of Session - 04/25/19 1336    Visit Number  3    Number of Visits  4    Date for PT Re-Evaluation  04/26/19    Authorization Type  Medicaid    Authorization Time Period  04/09/18-04/30/18    Authorization - Visit Number  2    Authorization - Number of Visits  3    PT Start Time  1332    PT Stop Time  1413    PT Time Calculation (min)  41 min    Activity Tolerance  Patient tolerated treatment well    Behavior During Therapy  [facility] for tasks assessed/performed       Past Medical History:  Diagnosis Date  . Anxiety   . DDD (degenerative disc disease), lumbar   . Degenerative disc disease, lumbar   . Depression    due to chronic pain  . Diabetes mellitus without complication ([place])   . Dislocation of shoulder    x 9  . GERD (gastroesophageal reflux disease)   . Hyperlipemia   . Hypothyroidism   . Left facial numbness 2010   and tingling  and left arm- years ago.  Dr says it was stress  . Scoliosis   . Shortness of breath dyspnea    with exertation  . Thyroid disease   . Umbilical hernia     Past Surgical History:  Procedure Laterality Date  . BACK SURGERY  2005  . CERVICAL CONIZATION W/BX    . ELBOW SURGERY Right 2007   fracture repair with screws  . INDUCED ABORTION    . SHOULDER ARTHROSCOPY Right 09/09/2014   Procedure: RIGHT SHOULDER ARTHROSCOPY WITH LABRAL REPAIR;  Surgeon: Mcarthur Rossetti, MD;  Location: [facility];  Service: Orthopedics;  Laterality: Right;  . SHOULDER SURGERY Right 2001   arthroscopy  . TUBAL LIGATION  11/20/1996    There were no vitals filed for this visit.  Subjective Assessment - 04/25/19 1407     Subjective  "Good" today with no pain. Still ongoing limitation of sitting tolerance before she has to lay down. Discussed status and she reports feels ready to d/c therapy-will be going out of town soon and still pending lumbar surgery.    Pertinent History  left L3-S1 hemilaminectomy 2018, diabetic, HTN, ventral hernia, anxiety/depression, chronic pain in pain management    Limitations  Sitting;House hold activities;Lifting;Standing;Walking    Diagnostic tests  Recent MRI 03/14/19 but no copy of report available, past MRI 2018-see chart copy of report    Patient Stated Goals  Improve range of motion, relieve pain    Currently in Pain?  No/denies         [facility] PT Assessment - 04/25/19 0001      AROM   Left Ankle Dorsiflexion  -25    Lumbar Flexion  100    Lumbar Extension  10    Lumbar - Right Side Bend  25    Lumbar - Left Side Bend  27    Lumbar - Right Rotation  40%    Lumbar - Left Rotation  30%      Strength   Right Hip Flexion  4-/5    Right Hip External Rotation  4/5    Right Hip Internal Rotation  4/5    Left Hip Flexion  4+/5    Left Hip External Rotation  4-/5    Left Hip Internal Rotation  4/5    Right Knee Flexion  5/5    Right Knee Extension  4+/5    Left Knee Flexion  5/5    Left Knee Extension  4+/5    Right Ankle Dorsiflexion  5/5    Right Ankle Inversion  4+/5    Right Ankle Eversion  4+/5    Left Ankle Dorsiflexion  3-/5    Left Ankle Inversion  4-/5    Left Ankle Eversion  3+/5                   [facility] Adult PT Treatment/Exercise - 04/25/19 0001      Exercises   Exercises  Lumbar      Lumbar Exercises: Aerobic   Nustep  L4 x 5 min UE/LE      Lumbar Exercises: Standing   Functional Squats Limitations  partial squat at counter x 15 reps    Row  AROM;Strengthening;Both;15 reps    Theraband Level (Row)  Level 3 (Green)    Shoulder Extension  AROM;Strengthening;15 reps    Theraband Level (Shoulder Extension)  Level 3 (Green)    Other  Standing Lumbar Exercises  pall off press x 15 reps blue band     Other Standing Lumbar Exercises  heel raises x 20      Lumbar Exercises: Seated   Other Seated Lumbar Exercises  seated clamshell green band 2x10    Other Seated Lumbar Exercises  seated hip adduction isometric with pillow 5 sec x 15 reps, seated LAQ with ball squeeze             PT Education - 04/25/19 1403    Education Details  HEP updates, POC    Person(s) Educated  Patient    Methods  Explanation;Demonstration;Verbal cues;Handout    Comprehension  Returned demonstration;Verbalized understanding          PT Long Term Goals - 04/25/19 1337      PT LONG TERM GOAL #1   Title  Independent with HEP    Baseline  met    Time  4    Period  Weeks    Status  Achieved      PT LONG TERM GOAL #2   Title  Return demos for body mechanics for lifting technique and bed mobility to minimize lumbar strain    Baseline  met    Time  4    Period  Weeks    Status  On-going      PT LONG TERM GOAL #3   Title  Increase left ankle DF AROM/PROM at least 5 deg to improve toe clearance for gait    Baseline  -25 deg    Time  4    Period  Weeks    Status  Not Met            Plan - 04/25/19 1404    Clinical Impression Statement  Pt. presents for 3rd therapy session today-doing well this PM in terms of pain but continues with intermittent pain and limitations of positional tolerance particularly for supine and seated positions. Discuused status with pt. and she reports feels ready to d/c so updated HEP with exercises (limiting supine exercises due to difficulty tolerating this position). For now she will continue with HEP and recommend MD  follow up as needed, also tentatively pending surgery but no date currently set.    Personal Factors and Comorbidities  Comorbidity 3+    Comorbidities  chronic pain and surgical history for lumbar region, hernia, left dropfoot, diabetic, history anxiety/depression    Examination-Activity  Limitations  Sit;Transfers;Sleep;Lift;Squat;Bend;Locomotion Level;Stairs;Stand    Examination-Participation Restrictions  Community Activity;Shop;Cleaning    Stability/Clinical Decision Making  Evolving/Moderate complexity    Clinical Decision Making  Moderate    Rehab Potential  Fair    PT Frequency  1x / week    PT Duration  4 weeks    PT Treatment/Interventions  Dry needling;ADLs/Self Care Home Management;Cryotherapy;Traction;Ultrasound;Electrical Stimulation;Moist Heat;Therapeutic activities;Functional mobility training;Therapeutic exercise;Neuromuscular re-education;Manual techniques;Patient/family education    PT Next Visit Plan  NA    PT Home Exercise Plan  Theraband row, extension and pall-off press, partial squat at counter, seated hip abduction and adduction, LAQ with hip adduction, prone TA contraction, pelvic tilt, hip adduction isometrics, hip bridge, seated vs. longsitting gastroc stretch    Consulted and Agree with Plan of Care  Patient       Patient will benefit from skilled therapeutic intervention in order to improve the following deficits and impairments:  Decreased activity tolerance, Decreased strength, Impaired flexibility, Postural dysfunction, Decreased range of motion, Increased muscle spasms, Difficulty walking, Abnormal gait  Visit Diagnosis: Degeneration of lumbar intervertebral disc  Chronic left-sided low back pain with bilateral sciatica  Muscle weakness (generalized)  Difficulty in walking, not elsewhere classified  Abnormal posture     Problem List Patient Active Problem List   Diagnosis Date Noted  . Syncope 04/18/2016  . Chronic pain 04/18/2016  . 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 BREAST MASS, LEFT 09/26/2007  . RHINITIS, ALLERGIC NOS 10/26/2006  . [place] DISEASE 10/24/2006  . Hypothyroidism 10/24/2006  . HYPERTENSION 10/24/2006  . HX, PERSONAL, CERVICAL DYSPLASIA 10/24/2006      PHYSICAL THERAPY DISCHARGE  SUMMARY  Visits from Start of Care: 3  Current functional level related to goals / functional outcomes: See subjective  Remaining deficits: LE and core weakness, left dropfoot   Education / Equipment: HEP  Plan: Patient agrees to discharge.  Patient goals were partially met. Patient is being discharged due to the patient's request.  ?????          Beaulah Dinning, PT, DPT 04/25/19 2:16 PM     [facility] [address] [place], [place], [address] Phone: [phone]   Fax:  [phone]  Name: Karen Nixon MRN: [id number] Date of Birth: Jun 17, 1970

## 2019-06-12 ENCOUNTER — Other Ambulatory Visit: Payer: Self-pay | Admitting: Neurological Surgery

## 2019-06-12 ENCOUNTER — Other Ambulatory Visit: Payer: Self-pay

## 2019-06-14 ENCOUNTER — Other Ambulatory Visit: Payer: Self-pay

## 2019-06-28 ENCOUNTER — Encounter: Payer: Self-pay | Admitting: Allergy

## 2019-06-28 ENCOUNTER — Ambulatory Visit (INDEPENDENT_AMBULATORY_CARE_PROVIDER_SITE_OTHER): Payer: Medicaid Other | Admitting: Allergy

## 2019-06-28 ENCOUNTER — Other Ambulatory Visit: Payer: Self-pay

## 2019-06-28 VITALS — BP 110/82 | HR 97 | Temp 96.9°F | Resp 16 | Ht 65.0 in | Wt 187.8 lb

## 2019-06-28 DIAGNOSIS — J3089 Other allergic rhinitis: Secondary | ICD-10-CM

## 2019-06-28 DIAGNOSIS — L253 Unspecified contact dermatitis due to other chemical products: Secondary | ICD-10-CM | POA: Diagnosis not present

## 2019-06-28 DIAGNOSIS — K9049 Malabsorption due to intolerance, not elsewhere classified: Secondary | ICD-10-CM | POA: Diagnosis not present

## 2019-06-28 DIAGNOSIS — H1013 Acute atopic conjunctivitis, bilateral: Secondary | ICD-10-CM

## 2019-06-28 MED ORDER — AZELASTINE HCL 0.1 % NA SOLN: 2.0000 | Freq: Two times a day (BID) | NASAL | 5 refills | Status: AC

## 2019-06-28 MED ORDER — EPINEPHRINE 0.3 MG/0.3ML IJ SOAJ: 0.3000 mg | Freq: Once | INTRAMUSCULAR | 2 refills | Status: AC

## 2019-06-28 NOTE — Patient Instructions (Addendum)
 -  Environmental allergy skin testing today is positive to grass pollens, weed pollens, tree pollens, dust mites and cat.  -Allergen avoidance measures discussed/handouts provided  -Resume Zyrtec 10 mg daily use at this time  -Start nasal antihistamine, Astelin 2 sprays each nostril twice a day for nasal drainage control.  Once nasal drainage is under better control can decrease use  -Continue use of Alaway 1 drop each eye twice a day as needed for itchy, watery or red eyes.  Could also try use of over-the-counter Pataday or Pataday extra strength if Alaway is not effective.  -allergen immunotherapy discussed today including protocol, benefits and risk.  Informational handout provided.  Can have you sign consent in preparation for starting on allergen injections.  Will provide with an epinephrine device to bring on days of your allergy shots.   -Skin testing to egg and milk are negative.  That she do not have IgE mediated food allergy to egg and milk however you can have intolerances.  Would continue to avoid these products to avoid symptoms with ingestion.  -If interested in eating squash in the future let us know we can potentially can perform in office challenge.   -Continue to use gloves with cleaning including dishwashing to decrease your contact exposure to cleaning chemicals  Follow-up 4-6 months or sooner if needed

## 2019-06-28 NOTE — Progress Notes (Signed)
New Patient Note  RE: Karen Nixon MRN: [id number] DOB: 01-Dec-1970 Date of Office Visit: 06/28/2019  Referring provider: Chesley Noon, MD Primary care provider: Aura Dials, PA-C  Chief Complaint: allergies and food allergy  History of present illness: Karen Nixon is a 49 y.o. female presenting today for consultation for allergies, environmental and food.  Environmental allergy symptoms include post-nasal drip with cough, sore throat and throat clearing.  Also reports sinus pressure of her cheeks, itchy/watery eyes, itchy throat and face. She does report having sinus infections about once a year prior to using nasal saline rinses. She started using a nasal saline rinses about 10 years ago 1-2 times a week and she states that helps with her allergy symptom control.  Currently not using any nasal sprays.  She states previous use of nasal steroid sprays caused her nose to bleed.  She takes cetirizine daily with onset of spring and does help with cough and drainage control.  She uses Alaway eye drop that helps her itchy eyes. She has also used benadryl as needed. She does states she sneezes when she is around cats.    She avoids dairy products as much as possible.  She states if she would eat dairy she would take Beano beforehand.  She also will take simethicon as well to help decrease gas. She states she has passed out from pain including gas-related pain.  She states after a dairy ingestion she has increased gas.  Once episode she states she had increase gas and was sweating and did pass out from the gas pain.  This last time she reports passing out after dairy and increased gas was in 2018.   She reports Lactaid milk caused similar symptoms as regular dairy.  She reports she drinks almond milk.   She states the same symptoms with milk occurs with egg.  She avoids dairy and eggs in the diet. She states she developed itching on her hands, face, throat after handling spaghetti squash.   She however then felt that her symptoms were related to using clorox to wash dishes and not the squash.  She started using gloves to wash dishes and this problem went away.  She states she had a episode where she developed facial swelling after eating shrimp.  She states however she has had shrimp other times without issue including last night.    She has had bronchitis from smoking in the past and had an albuterol inhaler at that time.  She does not smoke anymore and quit smoking 01/08/2019 for good.    No history of eczema.  Review of systems: Review of Systems  Constitutional: Negative.   HENT:       See HPI  Eyes:       See HPI  Respiratory: Negative.   Cardiovascular: Negative.   Gastrointestinal: Negative.   Musculoskeletal: Negative.   Skin: Negative.   Neurological: Negative.     All other systems negative unless noted above in HPI  Past medical history: Past Medical History:  Diagnosis Date  . Anxiety   . DDD (degenerative disc disease), lumbar   . Degenerative disc disease, lumbar   . Depression    due to chronic pain  . Diabetes mellitus without complication ([place])   . Dislocation of shoulder    x 9  . GERD (gastroesophageal reflux disease)   . Hyperlipemia   . Hypothyroidism   . Left facial numbness 2010   and tingling  and left arm- years  ago.  Dr says it was stress  . Scoliosis   . Shortness of breath dyspnea    with exertation  . Thyroid disease   . Umbilical hernia     Past surgical history: Past Surgical History:  Procedure Laterality Date  . BACK SURGERY  2005  . CERVICAL CONIZATION W/BX    . ELBOW SURGERY Right 2007   fracture repair with screws  . INDUCED ABORTION    . SHOULDER ARTHROSCOPY Right 09/09/2014   Procedure: RIGHT SHOULDER ARTHROSCOPY WITH LABRAL REPAIR;  Surgeon: Mcarthur Rossetti, MD;  Location: [facility];  Service: Orthopedics;  Laterality: Right;  . SHOULDER SURGERY Right 2001   arthroscopy  . TUBAL LIGATION  11/20/1996     Family history:  Family History  Problem Relation Age of Onset  . Cancer Mother   . Hypertension Brother     Social history: She lives in an apartment without carpeting with gas heating and central cooling.  No pets in the home.  No concern for water damage, mildew or roaches in the home.  She does not work currently.  Tobacco Use  . Smoking status: Former Smoker    Types: Cigarettes    Quit date: 12/2018    Years since quitting: 0.5  . Smokeless tobacco: Never Used    Medication List: Current Outpatient Medications  Medication Sig Dispense Refill  . APPLE CIDER VINEGAR PO Take 1 capsule by mouth daily.    . calcium elemental as carbonate (CALCIUM ANTACID ULTRA) 400 MG chewable tablet Chew 3,000 mg by mouth daily as needed for heartburn.    . cetirizine (ZYRTEC) 10 MG tablet Take 10 mg by mouth daily as needed for allergies.    Marland Kitchen ergocalciferol (VITAMIN D2) 1.25 MG (50000 UT) capsule Take 50,000 Units by mouth every Sunday.     . gabapentin (NEURONTIN) 300 MG capsule Take 1 capsule (300 mg total) by mouth 3 (three) times daily. 90 capsule 1  . ibuprofen (ADVIL) 600 MG tablet Take 600 mg by mouth every 6 (six) hours as needed for moderate pain.    Marland Kitchen Ketotifen Fumarate (ALAWAY OP) Place 1 drop into both eyes daily as needed (allergies).    . latanoprost (XALATAN) 0.005 % ophthalmic solution Place 1 drop into both eyes at bedtime.     Marland Kitchen levothyroxine (SYNTHROID) 100 MCG tablet Take 1 tablet (100 mcg total) by mouth daily before breakfast. 30 tablet 1  . lisinopril (ZESTRIL) 5 MG tablet Take 5 mg by mouth daily.    . metFORMIN (GLUCOPHAGE-XR) 500 MG 24 hr tablet Take 500 mg by mouth in the morning and at bedtime.    . Multiple Vitamin (MULTIVITAMIN WITH MINERALS) TABS tablet Take 2 tablets by mouth daily.    Marland Kitchen OVER THE COUNTER MEDICATION Take 1 tablet by mouth daily as needed (gas). Equate Gas and Bloating prevention    . rosuvastatin (CRESTOR) 20 MG tablet Take 20 mg by mouth  daily.    Marland Kitchen tiZANidine (ZANAFLEX) 2 MG tablet Take 2 mg by mouth at bedtime as needed for muscle spasms.     Marland Kitchen azelastine (ASTELIN) 0.1 % nasal spray Place 2 sprays into both nostrils 2 (two) times daily. Use in each nostril as directed 30 mL 5  . EPINEPHrine 0.3 mg/0.3 mL IJ SOAJ injection Inject 0.3 mLs (0.3 mg total) into the muscle once for 1 dose. 2 each 2   No current facility-administered medications for this visit.    Known medication allergies: Allergies  Allergen  Reactions  . Lac Bovis Other (See Comments), Diarrhea, Nausea And Vomiting and Palpitations  . Iodinated Diagnostic Agents Other (See Comments) and Hives     Code: HIVES, Desc: Pt developed hives on neck, face and back with itching. Given 50mg  oral benadryl., Onset Date: [id number]   . Naproxen Sodium Hives    swelling per pt  . Iohexol      Code: HIVES, Desc: Pt developed hives on neck, face and back with itching. Given 50mg  oral benadryl., Onset Date: [id number]   . Lactose Intolerance (Gi) Diarrhea, Nausea And Vomiting and Palpitations     Physical examination: Blood pressure 110/82, pulse 97, temperature (!) 96.9 F (36.1 C), temperature source Temporal, resp. rate 16, height 5\' 5"  (1.651 m), weight 187 lb 12.8 oz (85.2 kg), SpO2 98 %.  General: Alert, interactive, in no acute distress. HEENT: PERRLA, TMs pearly gray, turbinates moderately edematous without discharge, post-pharynx non erythematous. Neck: Supple without lymphadenopathy. Lungs: Clear to auscultation without wheezing, rhonchi or rales. {no increased work of breathing. CV: Normal S1, S2 without murmurs. Abdomen: Nondistended, nontender. Skin: Warm and dry, without lesions or rashes. Extremities:  No clubbing, cyanosis or edema. Neuro:   Grossly intact.  Diagnositics/Labs:  Allergy testing: Environmental allergy skin prick testing is positive to grass pollens, weed pollens, tree pollen, dust mites, cat. Select food allergy skin prick testing is  negative to milk and egg. Allergy testing results were read and interpreted by provider, documented by clinical staff.   Assessment and plan:   Allergic rhinitis with conjunctivitis  -Environmental allergy skin testing today is positive to grass pollens, weed pollens, tree pollens, dust mites and cat.  -Allergen avoidance measures discussed/handouts provided  -Resume Zyrtec 10 mg daily use at this time  -Start nasal antihistamine, Astelin 2 sprays each nostril twice a day for nasal drainage control.  Once nasal drainage is under better control can decrease use  -Continue use of Alaway 1 drop each eye twice a day as needed for itchy, watery or red eyes.  Could also try use of over-the-counter Pataday or Pataday extra strength if Alaway is not effective.  -allergen immunotherapy discussed today including protocol, benefits and risk.  Informational handout provided.  Can have you sign consent in preparation for starting on allergen injections.  Will provide with an epinephrine device to bring on days of your allergy shots.  Food intolerance  -Skin testing to egg and milk are negative.  That she do not have IgE mediated food allergy to egg and milk however you can have intolerances.  Would continue to avoid these products to avoid symptoms with ingestion.  -If interested in eating squash in the future let us know we can potentially can perform in office challenge.  Contact dermatitis  -Continue to use gloves with cleaning including dishwashing to decrease your contact exposure to cleaning chemicals  -In future can consider patch testing  Follow-up 4-6 months or sooner if needed  I appreciate the opportunity to take part in Wenonah's care. Please do not hesitate to contact me with questions.  Sincerely,   Prudy Feeler, MD Allergy/Immunology [facility]

## 2019-07-02 ENCOUNTER — Ambulatory Visit (INDEPENDENT_AMBULATORY_CARE_PROVIDER_SITE_OTHER): Payer: Medicaid Other | Admitting: Vascular Surgery

## 2019-07-02 ENCOUNTER — Other Ambulatory Visit: Payer: Self-pay

## 2019-07-02 ENCOUNTER — Encounter: Payer: Self-pay | Admitting: Vascular Surgery

## 2019-07-02 VITALS — BP 109/75 | HR 84 | Temp 97.2°F | Resp 16 | Ht 65.0 in | Wt 187.7 lb

## 2019-07-02 DIAGNOSIS — M5137 Other intervertebral disc degeneration, lumbosacral region: Secondary | ICD-10-CM

## 2019-07-02 NOTE — Progress Notes (Signed)
[facility]  Patient name: Karen Nixon MRN: [id number] DOB: 12/02/1970 Sex: female  REASON FOR CONSULT: Discuss exposure for multilevel disc fusion with Dr. Ellene Route  HPI: Karen Nixon is a 48 y.o. female, who is here today for discussion of anterior exposure for L3-4, L4-5 and L5-S1 disc fusion from an anterior approach.  She has a prior history of posterior discectomy with Dr. Ellene Route in 2005 and did quite well for many years.  Over the past 2 to 3 years she has had recurrent back pain and now radicular pain with a left foot drop.  Evaluation by Dr.Elsner revealed indication for anterior fusion.  She is seeing me for discussion of my role in exposure.  She has had a prior laparoscopic tubal ligation but no other  abdominal surgery.  She has no history of peripheral vascular occlusive disease. Past Medical History:  Diagnosis Date  . Anxiety   . DDD (degenerative disc disease), lumbar   . Degenerative disc disease, lumbar   . Depression    due to chronic pain  . Diabetes mellitus without complication ([place])   . Dislocation of shoulder    x 9  . GERD (gastroesophageal reflux disease)   . Glaucoma   . Hyperlipemia   . Hypothyroidism   . Left facial numbness 2010   and tingling  and left arm- years ago.  Dr says it was stress  . Scoliosis   . Shortness of breath dyspnea    with exertation  . Thyroid disease   . Umbilical hernia     Family History  Problem Relation Age of Onset  . Cancer Mother   . Hypertension Brother     SOCIAL HISTORY: Social History   Socioeconomic History  . Marital status: Divorced    Spouse name: Not on file  . Number of children: Not on file  . Years of education: Not on file  . Highest education level: Not on file  Occupational History  . Not on file  Tobacco Use  . Smoking status: Former Smoker    Types: Cigarettes    Quit date: 11/22/2018    Years since quitting: 0.6  .  Smokeless tobacco: Never Used  Substance and Sexual Activity  . Alcohol use: Not Currently    Comment: occasionally a couple of beers  . Drug use: No  . Sexual activity: Never  Other Topics Concern  . Not on file  Social History Narrative  . Not on file   Social Determinants of Health   Financial Resource Strain:   . Difficulty of Paying Living Expenses:   Food Insecurity:   . Worried About Charity fundraiser in the Last Year:   . Arboriculturist in the Last Year:   Transportation Needs:   . Film/video editor (Medical):   Marland Kitchen Lack of Transportation (Non-Medical):   Physical Activity:   . Days of Exercise per Week:   . Minutes of Exercise per Session:   Stress:   . Feeling of Stress :   Social Connections:   . Frequency of Communication with Friends and Family:   . Frequency of Social Gatherings with Friends and Family:   . Attends Religious Services:   . Active Member of Clubs or Organizations:   . Attends Archivist Meetings:   Marland Kitchen Marital Status:   Intimate Partner Violence:   . Fear of Current or Ex-Partner:   . Emotionally Abused:   Marland Kitchen Physically Abused:   .  Sexually Abused:     Allergies  Allergen Reactions  . Lac Bovis Other (See Comments), Diarrhea, Nausea And Vomiting and Palpitations  . Iodinated Diagnostic Agents Other (See Comments) and Hives     Code: HIVES, Desc: Pt developed hives on neck, face and back with itching. Given 50mg  oral benadryl., Onset Date: [id number]   . Naproxen Sodium Hives    swelling per pt  . Iohexol      Code: HIVES, Desc: Pt developed hives on neck, face and back with itching. Given 50mg  oral benadryl., Onset Date: [id number]   . Lactose Intolerance (Gi) Diarrhea, Nausea And Vomiting and Palpitations    Current Outpatient Medications  Medication Sig Dispense Refill  . APPLE CIDER VINEGAR PO Take 1 capsule by mouth daily.    Marland Kitchen azelastine (ASTELIN) 0.1 % nasal spray Place 2 sprays into both nostrils 2 (two) times  daily. Use in each nostril as directed 30 mL 5  . calcium elemental as carbonate (CALCIUM ANTACID ULTRA) 400 MG chewable tablet Chew 3,000 mg by mouth daily as needed for heartburn.    . cetirizine (ZYRTEC) 10 MG tablet Take 10 mg by mouth daily as needed for allergies.    Marland Kitchen EPINEPHrine 0.3 mg/0.3 mL IJ SOAJ injection Inject 0.3 mg into the muscle as needed for anaphylaxis.    Marland Kitchen ergocalciferol (VITAMIN D2) 1.25 MG (50000 UT) capsule Take 50,000 Units by mouth every Sunday.     . gabapentin (NEURONTIN) 300 MG capsule Take 1 capsule (300 mg total) by mouth 3 (three) times daily. 90 capsule 1  . ibuprofen (ADVIL) 600 MG tablet Take 600 mg by mouth every 6 (six) hours as needed for moderate pain.    Marland Kitchen Ketotifen Fumarate (ALAWAY OP) Place 1 drop into both eyes daily as needed (allergies).    . latanoprost (XALATAN) 0.005 % ophthalmic solution Place 1 drop into both eyes at bedtime.     Marland Kitchen levothyroxine (SYNTHROID) 100 MCG tablet Take 1 tablet (100 mcg total) by mouth daily before breakfast. 30 tablet 1  . lisinopril (ZESTRIL) 5 MG tablet Take 5 mg by mouth daily.    . metFORMIN (GLUCOPHAGE-XR) 500 MG 24 hr tablet Take 500 mg by mouth in the morning and at bedtime.    . Multiple Vitamin (MULTIVITAMIN WITH MINERALS) TABS tablet Take 2 tablets by mouth daily.    . rosuvastatin (CRESTOR) 20 MG tablet Take 20 mg by mouth daily.    . Simethicone 180 MG CAPS Take 2 capsules by mouth as needed.    Marland Kitchen tiZANidine (ZANAFLEX) 2 MG tablet Take 2 mg by mouth at bedtime as needed for muscle spasms.     Marland Kitchen OVER THE COUNTER MEDICATION Take 1 tablet by mouth daily as needed (gas). Equate Gas and Bloating prevention     No current facility-administered medications for this visit.    REVIEW OF SYSTEMS:  [X]  denotes positive finding, [ ]  denotes negative finding Cardiac  Comments:  Chest pain or chest pressure:    Shortness of breath upon exertion:    Short of breath when lying flat:    Irregular heart rhythm:          Vascular    Pain in calf, thigh, or hip brought on by ambulation:    Pain in feet at night that wakes you up from your sleep:     Blood clot in your veins:    Leg swelling:         Pulmonary    Oxygen  at home:    Productive cough:     Wheezing:         Neurologic    Sudden weakness in arms or legs:     Sudden numbness in arms or legs:     Sudden onset of difficulty speaking or slurred speech:    Temporary loss of vision in one eye:     Problems with dizziness:         Gastrointestinal    Blood in stool:     Vomited blood:         Genitourinary    Burning when urinating:     Blood in urine:        Psychiatric    Major depression:         Hematologic    Bleeding problems:    Problems with blood clotting too easily:        Skin    Rashes or ulcers:        Constitutional    Fever or chills:      PHYSICAL EXAM: Vitals:   07/02/19 1416  BP: 109/75  Pulse: 84  Resp: 16  Temp: (!) 97.2 F (36.2 C)  TempSrc: Temporal  SpO2: 95%  Weight: 187 lb 11.2 oz (85.1 kg)  Height: 5\' 5"  (1.651 m)    GENERAL: The patient is a well-nourished female, in no acute distress. The vital signs are documented above. CARDIOVASCULAR: 2+ radial and 2+ dorsalis pedis pulses bilaterally PULMONARY: There is good air exchange  ABDOMEN: Soft and non-tender  MUSCULOSKELETAL: There are no major deformities or cyanosis. NEUROLOGIC: No focal weakness or paresthesias are detected. SKIN: There are no ulcers or rashes noted. PSYCHIATRIC: The patient has a normal affect.  DATA:  I reviewed her CT scan from a stone study several years ago.  This shows no evidence of any atherosclerotic change in her aorta.  She has normal arterial and venous anatomy in her pelvis with normal location of her aortic bifurcation  MEDICAL ISSUES: Had long discussion with the patient regarding my role for anterior exposure.  Explained that Dr. Is recommended fusion and part of this from anterior approach.  I  explained a left paramedian incision over the 3 levels and mobilization of the rectus muscle.  Explained retroperitoneal exposure with mobilization of the left ureter and intra-abdominal contents to the right and mobilization of the arterial and venous structures overlying the spine.  Explained the potential for injury to these.  I do not see any contraindication to surgery.  She is not morbidly obese.  She has only had tubal ligation and intra-abdominal surgery and has no evidence of peripheral arterial calcification.  We will proceed on 07/08/2019 as planned   Rosetta Posner, MD [facility] Tel [phone] Pager [phone]

## 2019-07-03 NOTE — Progress Notes (Signed)
[facility] [id number], [place] [address] [place] [address] Phone: [phone] Fax: [phone]    Your procedure is scheduled on Monday, May 10th.  Report to [facility] Main Entrance "A" at 5:30 A.M., and check in at the Admitting office.  Call this number if you have problems the morning of surgery:  [phone]  Call [phone] if you have any questions prior to your surgery date Monday-Friday 8am-4pm   Remember:  Do not eat or drink after midnight the night before your surgery   Take these medicines the morning of surgery with A SIP OF WATER  azelastine (ASTELIN)/nasal spray gabapentin (NEURONTIN) levothyroxine (SYNTHROID)  rosuvastatin (CRESTOR)   If needed - cetirizine (ZYRTEC), EPINEPHrine/injection, Ketotifen Fumarate (ALAWAY OP)/eye drops, Simethicone     As of today, STOP taking any Aspirin (unless otherwise instructed by your surgeon) and Aspirin containing products, Aleve, Naproxen, Ibuprofen, Motrin, Advil, Goody's, BC's, all herbal medications, fish oil, and all vitamins.          WHAT DO I DO ABOUT MY DIABETES MEDICATION?   - The morning of surgery Do NOT take metFORMIN (GLUCOPHAGE-XR)  HOW TO MANAGE YOUR DIABETES BEFORE AND AFTER SURGERY  Why is it important to control my blood sugar before and after surgery? . Improving blood sugar levels before and after surgery helps healing and can limit problems. . A way of improving blood sugar control is eating a healthy diet by: o  Eating less sugar and carbohydrates o  Increasing activity/exercise o  Talking with your doctor about reaching your blood sugar goals . High blood sugars (greater than 180 mg/dL) can raise your risk of infections and slow your recovery, so you will need to focus on controlling your diabetes during the weeks before surgery. . Make sure that the doctor who takes care of your diabetes knows about  your planned surgery including the date and location.  How do I manage my blood sugar before surgery? . Check your blood sugar at least 4 times a day, starting 2 days before surgery, to make sure that the level is not too high or low. . Check your blood sugar the morning of your surgery when you wake up and every 2 hours until you get to the Short Stay unit. o If your blood sugar is less than 70 mg/dL, you will need to treat for low blood sugar: - Do not take insulin. - Treat a low blood sugar (less than 70 mg/dL) with  cup of clear juice (cranberry or apple), 4 glucose tablets, OR glucose gel. - Recheck blood sugar in 15 minutes after treatment (to make sure it is greater than 70 mg/dL). If your blood sugar is not greater than 70 mg/dL on recheck, call [phone] for further instructions. . Report your blood sugar to the short stay nurse when you get to Short Stay.  . If you are admitted to the hospital after surgery: o Your blood sugar will be checked by the staff and you will probably be given insulin after surgery (instead of oral diabetes medicines) to make sure you have good blood sugar levels. o The goal for blood sugar control after surgery is 80-180 mg/dL.             Do not wear jewelry, make up, or nail polish            Do not wear lotions, powders, perfumes, or  deodorant.            Do not shave 48 hours prior to surgery.             Do not bring valuables to the hospital.            [facility] is not responsible for any belongings or valuables.  Do NOT Smoke (Tobacco/Vapping) or drink Alcohol 24 hours prior to your procedure If you use a CPAP at night, you may bring all equipment for your overnight stay.   Contacts, glasses, dentures or bridgework may not be worn into surgery.      For patients admitted to the hospital, discharge time will be determined by your treatment team.   Patients discharged the day of surgery will not be allowed to drive home, and someone needs  to stay with them for 24 hours.  Special instructions:   [place]- Preparing For Surgery  Before surgery, you can play an important role. Because skin is not sterile, your skin needs to be as free of germs as possible. You can reduce the number of germs on your skin by washing with CHG (chlorahexidine gluconate) Soap before surgery.  CHG is an antiseptic cleaner which kills germs and bonds with the skin to continue killing germs even after washing.   Oral Hygiene is also important to reduce your risk of infection.  Remember - BRUSH YOUR TEETH THE MORNING OF SURGERY WITH YOUR REGULAR TOOTHPASTE  Please do not use if you have an allergy to CHG or antibacterial soaps. If your skin becomes reddened/irritated stop using the CHG.  Do not shave (including legs and underarms) for at least 48 hours prior to first CHG shower. It is OK to shave your face.  Please follow these instructions carefully.   1. Shower the NIGHT BEFORE SURGERY and the MORNING OF SURGERY with CHG Soap.   2. If you chose to wash your hair, wash your hair first as usual with your normal shampoo.  3. After you shampoo, rinse your hair and body thoroughly to remove the shampoo.  4. Use CHG as you would any other liquid soap. You can apply CHG directly to the skin and wash gently with a scrungie or a clean washcloth.   5. Apply the CHG Soap to your body ONLY FROM THE NECK DOWN.  Do not use on open wounds or open sores. Avoid contact with your eyes, ears, mouth and genitals (private parts). Wash Face and genitals (private parts)  with your normal soap.   6. Wash thoroughly, paying special attention to the area where your surgery will be performed.  7. Thoroughly rinse your body with warm water from the neck down.  8. DO NOT shower/wash with your normal soap after using and rinsing off the CHG Soap.  9. Pat yourself dry with a CLEAN TOWEL.  10. Wear CLEAN PAJAMAS to bed the night before surgery, wear comfortable clothes the  morning of surgery  11. Place CLEAN SHEETS on your bed the night of your first shower and DO NOT SLEEP WITH PETS.  Day of Surgery: Shower with CHG as instructed above. Do not apply any deodorants/lotions.  Please wear clean clothes to the hospital/surgery center.   Remember to brush your teeth WITH YOUR REGULAR TOOTHPASTE.   Please read over the following fact sheets that you were given.

## 2019-07-04 ENCOUNTER — Other Ambulatory Visit (HOSPITAL_COMMUNITY)
Admission: RE | Admit: 2019-07-04 | Discharge: 2019-07-04 | Disposition: A | Discharge status: Home / Self Care | Payer: Medicaid Other | Source: Ambulatory Visit | Attending: Neurological Surgery | Admitting: Neurological Surgery

## 2019-07-04 ENCOUNTER — Encounter (HOSPITAL_COMMUNITY)
Admission: RE | Admit: 2019-07-04 | Discharge: 2019-07-04 | Disposition: A | Discharge status: Home / Self Care | Payer: Medicaid Other | Source: Ambulatory Visit | Attending: Neurological Surgery | Admitting: Neurological Surgery

## 2019-07-04 ENCOUNTER — Other Ambulatory Visit: Payer: Self-pay

## 2019-07-04 ENCOUNTER — Encounter (HOSPITAL_COMMUNITY): Payer: Self-pay

## 2019-07-04 DIAGNOSIS — Z01818 Encounter for other preprocedural examination: Secondary | ICD-10-CM | POA: Diagnosis not present

## 2019-07-04 DIAGNOSIS — Z20822 Contact with and (suspected) exposure to covid-19: Secondary | ICD-10-CM | POA: Diagnosis not present

## 2019-07-04 DIAGNOSIS — R9431 Abnormal electrocardiogram [ECG] [EKG]: Secondary | ICD-10-CM | POA: Insufficient documentation

## 2019-07-04 LAB — CBC
HCT: 38.8 % (ref 36.0–46.0)
Hemoglobin: 12.1 g/dL (ref 12.0–15.0)
MCH: 28.4 pg (ref 26.0–34.0)
MCHC: 31.2 g/dL (ref 30.0–36.0)
MCV: 91.1 fL (ref 80.0–100.0)
Platelets: 271 10*3/uL (ref 150–400)
RBC: 4.26 MIL/uL (ref 3.87–5.11)
RDW: 13.5 % (ref 11.5–15.5)
WBC: 7 10*3/uL (ref 4.0–10.5)
nRBC: 0 % (ref 0.0–0.2)

## 2019-07-04 LAB — BASIC METABOLIC PANEL
Anion gap: 8 (ref 5–15)
BUN: 13 mg/dL (ref 6–20)
CO2: 25 mmol/L (ref 22–32)
Calcium: 9.3 mg/dL (ref 8.9–10.3)
Chloride: 109 mmol/L (ref 98–111)
Creatinine, Ser: 0.76 mg/dL (ref 0.44–1.00)
GFR calc Af Amer: >60 mL/min (ref >60)
GFR calc non Af Amer: >60 mL/min (ref >60)
Glucose, Bld: 102 mg/dL — ABNORMAL HIGH (ref 70–99)
Potassium: 3.9 mmol/L (ref 3.5–5.1)
Sodium: 142 mmol/L (ref 135–145)

## 2019-07-04 LAB — SURGICAL PCR SCREEN
MRSA, PCR: NEGATIVE
Staphylococcus aureus: NEGATIVE

## 2019-07-04 LAB — TYPE AND SCREEN
ABO/RH(D): O POS
Antibody Screen: NEGATIVE

## 2019-07-04 LAB — GLUCOSE, CAPILLARY: Glucose-Capillary: 102 mg/dL — ABNORMAL HIGH (ref 70–99)

## 2019-07-04 LAB — HEMOGLOBIN A1C
Hgb A1c MFr Bld: 6 % — ABNORMAL HIGH (ref 4.8–5.6)
Mean Plasma Glucose: 125.5 mg/dL

## 2019-07-04 LAB — SARS CORONAVIRUS 2 (TAT 6-24 HRS): SARS Coronavirus 2: NEGATIVE

## 2019-07-04 LAB — ABO/RH: ABO/RH(D): O POS

## 2019-07-04 NOTE — Progress Notes (Signed)
PCP Hedda Slade, NP Cardiologist - pt denies   Chest x-ray - n/a EKG - 07/04/19 Stress Test - pt denies  ECHO - pt denies Cardiac Cath - pt denies   Fasting Blood Sugar - 63-74  2 hrs after meals usually around 96-110 (with medication regimen) per pt Checks Blood Sugar 2x a day  Blood Thinner Instructions:n/a Aspirin Instructions:n/a   COVID TEST- 07/04/19   Coronavirus Screening  Have you experienced the following symptoms:  Cough yes/no: No Fever (>100.35F)  yes/no: No Runny nose yes/no: No Sore throat yes/no: No Difficulty breathing/shortness of breath  yes/no: No  Have you or a family member traveled in the last 14 days and where? yes/no: No   If the patient indicates "YES" to the above questions, their PAT will be rescheduled to limit the exposure to others and, the surgeon will be notified. THE PATIENT WILL NEED TO BE ASYMPTOMATIC FOR 14 DAYS.   If the patient is not experiencing any of these symptoms, the PAT nurse will instruct them to NOT bring anyone with them to their appointment since they may have these symptoms or traveled as well.   Please remind your patients and families that hospital visitation restrictions are in effect and the importance of the restrictions.     Anesthesia review:  n/a  Patient denies shortness of breath, fever, cough and chest pain at PAT appointment   All instructions explained to the patient, with a verbal understanding of the material. Patient agrees to go over the instructions while at home for a better understanding. Patient also instructed to self quarantine after being tested for COVID-19. The opportunity to ask questions was provided.

## 2019-07-05 ENCOUNTER — Other Ambulatory Visit: Payer: Self-pay | Admitting: Neurological Surgery

## 2019-07-05 ENCOUNTER — Ambulatory Visit (HOSPITAL_COMMUNITY)
Admission: RE | Admit: 2019-07-05 | Discharge: 2019-07-05 | Disposition: A | Discharge status: Home / Self Care | Payer: Medicaid Other | Source: Ambulatory Visit | Attending: Neurological Surgery | Admitting: Neurological Surgery

## 2019-07-05 ENCOUNTER — Other Ambulatory Visit (HOSPITAL_COMMUNITY): Payer: Self-pay | Admitting: Neurological Surgery

## 2019-07-05 DIAGNOSIS — M5136 Other intervertebral disc degeneration, lumbar region: Secondary | ICD-10-CM | POA: Diagnosis not present

## 2019-07-08 ENCOUNTER — Inpatient Hospital Stay (HOSPITAL_COMMUNITY)
Admission: RE | Disposition: A | Discharge status: Home Health | Payer: Self-pay | Source: Home / Self Care | Attending: Neurological Surgery

## 2019-07-08 ENCOUNTER — Inpatient Hospital Stay (HOSPITAL_COMMUNITY): Payer: Medicaid Other

## 2019-07-08 ENCOUNTER — Other Ambulatory Visit: Payer: Self-pay

## 2019-07-08 ENCOUNTER — Inpatient Hospital Stay (HOSPITAL_COMMUNITY): Payer: Medicaid Other | Admitting: Anesthesiology

## 2019-07-08 ENCOUNTER — Inpatient Hospital Stay (HOSPITAL_COMMUNITY): Payer: Medicaid Other | Admitting: Physician Assistant

## 2019-07-08 ENCOUNTER — Inpatient Hospital Stay (HOSPITAL_COMMUNITY)
Admission: RE | Admit: 2019-07-08 | Discharge: 2019-07-11 | DRG: 454 | Disposition: A | Discharge status: Home Health | Payer: Medicaid Other | Attending: Neurological Surgery | Admitting: Neurological Surgery

## 2019-07-08 ENCOUNTER — Encounter (HOSPITAL_COMMUNITY): Payer: Self-pay | Admitting: Neurological Surgery

## 2019-07-08 DIAGNOSIS — M5136 Other intervertebral disc degeneration, lumbar region: Secondary | ICD-10-CM | POA: Diagnosis not present

## 2019-07-08 DIAGNOSIS — E1165 Type 2 diabetes mellitus with hyperglycemia: Secondary | ICD-10-CM | POA: Diagnosis not present

## 2019-07-08 DIAGNOSIS — Z91041 Radiographic dye allergy status: Secondary | ICD-10-CM

## 2019-07-08 DIAGNOSIS — Z7984 Long term (current) use of oral hypoglycemic drugs: Secondary | ICD-10-CM | POA: Diagnosis not present

## 2019-07-08 DIAGNOSIS — M21372 Foot drop, left foot: Secondary | ICD-10-CM | POA: Diagnosis present

## 2019-07-08 DIAGNOSIS — Z886 Allergy status to analgesic agent status: Secondary | ICD-10-CM

## 2019-07-08 DIAGNOSIS — Z87891 Personal history of nicotine dependence: Secondary | ICD-10-CM | POA: Diagnosis not present

## 2019-07-08 DIAGNOSIS — Z8249 Family history of ischemic heart disease and other diseases of the circulatory system: Secondary | ICD-10-CM

## 2019-07-08 DIAGNOSIS — K219 Gastro-esophageal reflux disease without esophagitis: Secondary | ICD-10-CM | POA: Diagnosis present

## 2019-07-08 DIAGNOSIS — M5116 Intervertebral disc disorders with radiculopathy, lumbar region: Secondary | ICD-10-CM | POA: Diagnosis present

## 2019-07-08 DIAGNOSIS — M5416 Radiculopathy, lumbar region: Secondary | ICD-10-CM | POA: Diagnosis present

## 2019-07-08 DIAGNOSIS — Z419 Encounter for procedure for purposes other than remedying health state, unspecified: Secondary | ICD-10-CM

## 2019-07-08 DIAGNOSIS — M48061 Spinal stenosis, lumbar region without neurogenic claudication: Secondary | ICD-10-CM | POA: Diagnosis present

## 2019-07-08 DIAGNOSIS — M419 Scoliosis, unspecified: Secondary | ICD-10-CM | POA: Diagnosis present

## 2019-07-08 DIAGNOSIS — Z79899 Other long term (current) drug therapy: Secondary | ICD-10-CM

## 2019-07-08 DIAGNOSIS — E785 Hyperlipidemia, unspecified: Secondary | ICD-10-CM | POA: Diagnosis present

## 2019-07-08 DIAGNOSIS — Z9851 Tubal ligation status: Secondary | ICD-10-CM

## 2019-07-08 DIAGNOSIS — Z79890 Hormone replacement therapy (postmenopausal): Secondary | ICD-10-CM

## 2019-07-08 DIAGNOSIS — E039 Hypothyroidism, unspecified: Secondary | ICD-10-CM | POA: Diagnosis present

## 2019-07-08 DIAGNOSIS — H409 Unspecified glaucoma: Secondary | ICD-10-CM | POA: Diagnosis present

## 2019-07-08 DIAGNOSIS — M4726 Other spondylosis with radiculopathy, lumbar region: Secondary | ICD-10-CM | POA: Diagnosis present

## 2019-07-08 DIAGNOSIS — I10 Essential (primary) hypertension: Secondary | ICD-10-CM | POA: Diagnosis present

## 2019-07-08 DIAGNOSIS — D62 Acute posthemorrhagic anemia: Secondary | ICD-10-CM | POA: Diagnosis not present

## 2019-07-08 HISTORY — PX: APPLICATION OF ROBOTIC ASSISTANCE FOR SPINAL PROCEDURE: SHX6753

## 2019-07-08 HISTORY — PX: ABDOMINAL EXPOSURE: SHX5708

## 2019-07-08 HISTORY — PX: LUMBAR PERCUTANEOUS PEDICLE SCREW 3 LEVEL: SHX5562

## 2019-07-08 HISTORY — PX: ANTERIOR LUMBAR FUSION: SHX1170

## 2019-07-08 LAB — GLUCOSE, CAPILLARY
Glucose-Capillary: 98 mg/dL (ref 70–99)
Glucose-Capillary: 166 mg/dL — ABNORMAL HIGH (ref 70–99)
Glucose-Capillary: 147 mg/dL — ABNORMAL HIGH (ref 70–99)

## 2019-07-08 LAB — HEMOGLOBIN A1C
Hgb A1c MFr Bld: 6 % — ABNORMAL HIGH (ref 4.8–5.6)
Mean Plasma Glucose: 125.5 mg/dL

## 2019-07-08 SURGERY — ANTERIOR LUMBAR FUSION 3 LEVELS
Anesthesia: General | Site: Spine Lumbar

## 2019-07-08 MED ORDER — POLYETHYLENE GLYCOL 3350 17 G PO PACK: 17.0000 g | PACK | Freq: Every day | ORAL | Status: DC | PRN

## 2019-07-08 MED ORDER — LIDOCAINE-EPINEPHRINE 1 %-1:100000 IJ SOLN
INTRAMUSCULAR | Status: DC | PRN
  Administered 2019-07-08: 10 mL

## 2019-07-08 MED ORDER — PHENYLEPHRINE HCL-NACL 10-0.9 MG/250ML-% IV SOLN
INTRAVENOUS | Status: DC | PRN
  Administered 2019-07-08: 60 ug/min via INTRAVENOUS
  Administered 2019-07-08: 40 ug/min via INTRAVENOUS

## 2019-07-08 MED ORDER — SIMETHICONE 80 MG PO CHEW
80.0000 mg | CHEWABLE_TABLET | Freq: Four times a day (QID) | ORAL | Status: DC | PRN
  Filled 2019-07-08: qty 1

## 2019-07-08 MED ORDER — SUGAMMADEX SODIUM 200 MG/2ML IV SOLN
INTRAVENOUS | Status: DC | PRN
  Administered 2019-07-08: 300 mg via INTRAVENOUS

## 2019-07-08 MED ORDER — SODIUM CHLORIDE 0.9 % IV SOLN: 250.0000 mL | INTRAVENOUS | Status: DC

## 2019-07-08 MED ORDER — PROPOFOL 10 MG/ML IV BOLUS
INTRAVENOUS | Status: AC
  Filled 2019-07-08: qty 20

## 2019-07-08 MED ORDER — THROMBIN 20000 UNITS EX SOLR
CUTANEOUS | Status: AC
  Filled 2019-07-08: qty 20000

## 2019-07-08 MED ORDER — THROMBIN 5000 UNITS EX SOLR
OROMUCOSAL | Status: DC | PRN
  Administered 2019-07-08 (×3): 5 mL

## 2019-07-08 MED ORDER — ALBUMIN HUMAN 5 % IV SOLN
INTRAVENOUS | Status: AC
  Filled 2019-07-08: qty 250

## 2019-07-08 MED ORDER — HYDROMORPHONE HCL 1 MG/ML IJ SOLN
0.2500 mg | INTRAMUSCULAR | Status: DC | PRN
  Administered 2019-07-08 (×3): 0.25 mg via INTRAVENOUS

## 2019-07-08 MED ORDER — ONDANSETRON HCL 4 MG/2ML IJ SOLN: 4.0000 mg | Freq: Four times a day (QID) | INTRAMUSCULAR | Status: DC | PRN

## 2019-07-08 MED ORDER — MIDAZOLAM HCL 5 MG/5ML IJ SOLN
INTRAMUSCULAR | Status: DC | PRN
  Administered 2019-07-08 (×2): 2 mg via INTRAVENOUS

## 2019-07-08 MED ORDER — ONDANSETRON HCL 4 MG PO TABS: 4.0000 mg | ORAL_TABLET | Freq: Four times a day (QID) | ORAL | Status: DC | PRN

## 2019-07-08 MED ORDER — HYDROMORPHONE HCL 1 MG/ML IJ SOLN
INTRAMUSCULAR | Status: AC
  Filled 2019-07-08: qty 1

## 2019-07-08 MED ORDER — FENTANYL CITRATE (PF) 250 MCG/5ML IJ SOLN
INTRAMUSCULAR | Status: AC
  Filled 2019-07-08: qty 5

## 2019-07-08 MED ORDER — LORATADINE 10 MG PO TABS
10.0000 mg | ORAL_TABLET | Freq: Every day | ORAL | Status: DC
  Administered 2019-07-09 – 2019-07-11 (×3): 10 mg via ORAL
  Filled 2019-07-08 (×4): qty 1

## 2019-07-08 MED ORDER — AZELASTINE HCL 0.1 % NA SOLN
2.0000 | Freq: Two times a day (BID) | NASAL | Status: DC
  Administered 2019-07-08 – 2019-07-10 (×4): 2 via NASAL
  Filled 2019-07-08: qty 30

## 2019-07-08 MED ORDER — DOCUSATE SODIUM 100 MG PO CAPS
100.0000 mg | ORAL_CAPSULE | Freq: Two times a day (BID) | ORAL | Status: DC
  Administered 2019-07-08 – 2019-07-10 (×4): 100 mg via ORAL
  Filled 2019-07-08 (×5): qty 1

## 2019-07-08 MED ORDER — LIDOCAINE 2% (20 MG/ML) 5 ML SYRINGE
INTRAMUSCULAR | Status: AC
  Filled 2019-07-08: qty 5

## 2019-07-08 MED ORDER — PHENOL 1.4 % MT LIQD: 1.0000 | OROMUCOSAL | Status: DC | PRN

## 2019-07-08 MED ORDER — LACTATED RINGERS IV SOLN: INTRAVENOUS | Status: DC | PRN

## 2019-07-08 MED ORDER — DEXAMETHASONE SODIUM PHOSPHATE 10 MG/ML IJ SOLN
INTRAMUSCULAR | Status: DC | PRN
  Administered 2019-07-08: 10 mg via INTRAVENOUS

## 2019-07-08 MED ORDER — MEPERIDINE HCL 25 MG/ML IJ SOLN: 6.2500 mg | INTRAMUSCULAR | Status: DC | PRN

## 2019-07-08 MED ORDER — ORAL CARE MOUTH RINSE: 15.0000 mL | Freq: Once | OROMUCOSAL | Status: AC

## 2019-07-08 MED ORDER — THROMBIN 5000 UNITS EX SOLR
CUTANEOUS | Status: AC
  Filled 2019-07-08: qty 5000

## 2019-07-08 MED ORDER — LATANOPROST 0.005 % OP SOLN
1.0000 [drp] | Freq: Every day | OPHTHALMIC | Status: DC
  Administered 2019-07-08 – 2019-07-10 (×3): 1 [drp] via OPHTHALMIC
  Filled 2019-07-08: qty 2.5

## 2019-07-08 MED ORDER — ALBUMIN HUMAN 5 % IV SOLN
INTRAVENOUS | Status: DC | PRN
Start: 2019-07-08 — End: 2019-07-08

## 2019-07-08 MED ORDER — KETOROLAC TROMETHAMINE 15 MG/ML IJ SOLN
INTRAMUSCULAR | Status: AC
  Filled 2019-07-08: qty 1

## 2019-07-08 MED ORDER — THROMBIN 20000 UNITS EX SOLR
CUTANEOUS | Status: DC | PRN
  Administered 2019-07-08: 20 mL

## 2019-07-08 MED ORDER — TIZANIDINE HCL 4 MG PO TABS: 2.0000 mg | ORAL_TABLET | Freq: Every evening | ORAL | Status: DC | PRN

## 2019-07-08 MED ORDER — ONDANSETRON HCL 4 MG/2ML IJ SOLN
INTRAMUSCULAR | Status: AC
  Filled 2019-07-08: qty 2

## 2019-07-08 MED ORDER — SUCCINYLCHOLINE CHLORIDE 200 MG/10ML IV SOSY
PREFILLED_SYRINGE | INTRAVENOUS | Status: AC
  Filled 2019-07-08: qty 10

## 2019-07-08 MED ORDER — MIDAZOLAM HCL 2 MG/2ML IJ SOLN
INTRAMUSCULAR | Status: AC
  Filled 2019-07-08: qty 2

## 2019-07-08 MED ORDER — ACETAMINOPHEN 10 MG/ML IV SOLN: 1000.0000 mg | Freq: Once | INTRAVENOUS | Status: DC | PRN

## 2019-07-08 MED ORDER — LIDOCAINE HCL (CARDIAC) PF 100 MG/5ML IV SOSY
PREFILLED_SYRINGE | INTRAVENOUS | Status: DC | PRN
  Administered 2019-07-08: 60 mg via INTRAVENOUS

## 2019-07-08 MED ORDER — ONDANSETRON HCL 4 MG/2ML IJ SOLN
INTRAMUSCULAR | Status: DC | PRN
  Administered 2019-07-08: 4 mg via INTRAVENOUS

## 2019-07-08 MED ORDER — SODIUM CHLORIDE 0.9% FLUSH
3.0000 mL | Freq: Two times a day (BID) | INTRAVENOUS | Status: DC
  Administered 2019-07-08 – 2019-07-09 (×3): 3 mL via INTRAVENOUS

## 2019-07-08 MED ORDER — CHLORHEXIDINE GLUCONATE 0.12 % MT SOLN
15.0000 mL | Freq: Once | OROMUCOSAL | Status: AC
  Administered 2019-07-08: 15 mL via OROMUCOSAL
  Filled 2019-07-08: qty 15

## 2019-07-08 MED ORDER — PROPOFOL 10 MG/ML IV BOLUS
INTRAVENOUS | Status: DC | PRN
  Administered 2019-07-08: 180 mg via INTRAVENOUS

## 2019-07-08 MED ORDER — 0.9 % SODIUM CHLORIDE (POUR BTL) OPTIME
TOPICAL | Status: DC | PRN
  Administered 2019-07-08 (×3): 1000 mL

## 2019-07-08 MED ORDER — SODIUM CHLORIDE 0.9 % IV SOLN
INTRAVENOUS | Status: DC | PRN
  Administered 2019-07-08: 500 mL

## 2019-07-08 MED ORDER — BISACODYL 10 MG RE SUPP: 10.0000 mg | Freq: Every day | RECTAL | Status: DC | PRN

## 2019-07-08 MED ORDER — CHLORHEXIDINE GLUCONATE CLOTH 2 % EX PADS: 6.0000 | MEDICATED_PAD | Freq: Once | CUTANEOUS | Status: DC

## 2019-07-08 MED ORDER — METHOCARBAMOL 1000 MG/10ML IJ SOLN
500.0000 mg | Freq: Four times a day (QID) | INTRAVENOUS | Status: DC | PRN
  Filled 2019-07-08: qty 5

## 2019-07-08 MED ORDER — LISINOPRIL 10 MG PO TABS
5.0000 mg | ORAL_TABLET | Freq: Every day | ORAL | Status: DC
  Administered 2019-07-08 – 2019-07-11 (×4): 5 mg via ORAL
  Filled 2019-07-08 (×4): qty 1

## 2019-07-08 MED ORDER — SODIUM CHLORIDE 0.9% FLUSH: 3.0000 mL | INTRAVENOUS | Status: DC | PRN

## 2019-07-08 MED ORDER — FENTANYL CITRATE (PF) 100 MCG/2ML IJ SOLN
INTRAMUSCULAR | Status: DC | PRN
  Administered 2019-07-08 (×3): 50 ug via INTRAVENOUS
  Administered 2019-07-08: 100 ug via INTRAVENOUS
  Administered 2019-07-08 (×3): 50 ug via INTRAVENOUS
  Administered 2019-07-08: 100 ug via INTRAVENOUS

## 2019-07-08 MED ORDER — KETOTIFEN FUMARATE 0.025 % OP SOLN
1.0000 [drp] | Freq: Every day | OPHTHALMIC | Status: DC | PRN
  Filled 2019-07-08: qty 5

## 2019-07-08 MED ORDER — MENTHOL 3 MG MT LOZG: 1.0000 | LOZENGE | OROMUCOSAL | Status: DC | PRN

## 2019-07-08 MED ORDER — PHENYLEPHRINE HCL (PRESSORS) 10 MG/ML IV SOLN
INTRAVENOUS | Status: DC | PRN
  Administered 2019-07-08: 80 ug via INTRAVENOUS

## 2019-07-08 MED ORDER — MORPHINE SULFATE (PF) 2 MG/ML IV SOLN: 2.0000 mg | INTRAVENOUS | Status: DC | PRN

## 2019-07-08 MED ORDER — GABAPENTIN 300 MG PO CAPS
300.0000 mg | ORAL_CAPSULE | Freq: Three times a day (TID) | ORAL | Status: DC
  Administered 2019-07-08 – 2019-07-11 (×8): 300 mg via ORAL
  Filled 2019-07-08 (×8): qty 1

## 2019-07-08 MED ORDER — BUPIVACAINE HCL (PF) 0.5 % IJ SOLN
INTRAMUSCULAR | Status: DC | PRN
  Administered 2019-07-08: 10 mL

## 2019-07-08 MED ORDER — BUPIVACAINE HCL (PF) 0.5 % IJ SOLN
INTRAMUSCULAR | Status: AC
  Filled 2019-07-08: qty 30

## 2019-07-08 MED ORDER — BUPIVACAINE HCL 0.5 % IJ SOLN: INTRAMUSCULAR | Status: DC | PRN

## 2019-07-08 MED ORDER — DEXMEDETOMIDINE HCL IN NACL 200 MCG/50ML IV SOLN
INTRAVENOUS | Status: DC | PRN
Start: 2019-07-08 — End: 2019-07-08
  Administered 2019-07-08: .2 ug/kg/h via INTRAVENOUS

## 2019-07-08 MED ORDER — LEVOTHYROXINE SODIUM 100 MCG PO TABS
100.0000 ug | ORAL_TABLET | Freq: Every day | ORAL | Status: DC
  Administered 2019-07-09 – 2019-07-11 (×3): 100 ug via ORAL
  Filled 2019-07-08 (×3): qty 1

## 2019-07-08 MED ORDER — FLEET ENEMA 7-19 GM/118ML RE ENEM: 1.0000 | ENEMA | Freq: Once | RECTAL | Status: DC | PRN

## 2019-07-08 MED ORDER — CHLORHEXIDINE GLUCONATE 4 % EX LIQD: 60.0000 mL | Freq: Once | CUTANEOUS | Status: DC

## 2019-07-08 MED ORDER — LACTATED RINGERS IV SOLN: INTRAVENOUS | Status: DC

## 2019-07-08 MED ORDER — CEFAZOLIN SODIUM-DEXTROSE 2-4 GM/100ML-% IV SOLN
2.0000 g | Freq: Three times a day (TID) | INTRAVENOUS | Status: AC
  Administered 2019-07-08 – 2019-07-09 (×2): 2 g via INTRAVENOUS
  Filled 2019-07-08 (×2): qty 100

## 2019-07-08 MED ORDER — ACETAMINOPHEN 650 MG RE SUPP: 650.0000 mg | RECTAL | Status: DC | PRN

## 2019-07-08 MED ORDER — CEFAZOLIN SODIUM-DEXTROSE 2-4 GM/100ML-% IV SOLN
2.0000 g | INTRAVENOUS | Status: AC
  Administered 2019-07-08 (×2): 2 g via INTRAVENOUS
  Filled 2019-07-08: qty 100

## 2019-07-08 MED ORDER — ROCURONIUM BROMIDE 100 MG/10ML IV SOLN
INTRAVENOUS | Status: DC | PRN
  Administered 2019-07-08: 100 mg via INTRAVENOUS
  Administered 2019-07-08: 40 mg via INTRAVENOUS
  Administered 2019-07-08: 50 mg via INTRAVENOUS

## 2019-07-08 MED ORDER — ACETAMINOPHEN 325 MG PO TABS: 325.0000 mg | ORAL_TABLET | Freq: Once | ORAL | Status: DC | PRN

## 2019-07-08 MED ORDER — LIDOCAINE-EPINEPHRINE 1 %-1:100000 IJ SOLN
INTRAMUSCULAR | Status: AC
  Filled 2019-07-08: qty 1

## 2019-07-08 MED ORDER — ALBUMIN HUMAN 5 % IV SOLN
12.5000 g | Freq: Once | INTRAVENOUS | Status: AC
  Administered 2019-07-08: 12.5 g via INTRAVENOUS

## 2019-07-08 MED ORDER — OXYCODONE-ACETAMINOPHEN 5-325 MG PO TABS
1.0000 | ORAL_TABLET | ORAL | Status: DC | PRN
  Administered 2019-07-09: 2 via ORAL
  Filled 2019-07-08: qty 2

## 2019-07-08 MED ORDER — PROMETHAZINE HCL 25 MG/ML IJ SOLN: 6.2500 mg | INTRAMUSCULAR | Status: DC | PRN

## 2019-07-08 MED ORDER — ROSUVASTATIN CALCIUM 20 MG PO TABS
20.0000 mg | ORAL_TABLET | Freq: Every day | ORAL | Status: DC
  Administered 2019-07-08 – 2019-07-11 (×4): 20 mg via ORAL
  Filled 2019-07-08 (×4): qty 1

## 2019-07-08 MED ORDER — INSULIN ASPART 100 UNIT/ML ~~LOC~~ SOLN
0.0000 [IU] | Freq: Three times a day (TID) | SUBCUTANEOUS | Status: DC
  Administered 2019-07-09 (×2): 3 [IU] via SUBCUTANEOUS

## 2019-07-08 MED ORDER — ACETAMINOPHEN 325 MG PO TABS
650.0000 mg | ORAL_TABLET | ORAL | Status: DC | PRN
  Administered 2019-07-10 – 2019-07-11 (×3): 650 mg via ORAL
  Filled 2019-07-08 (×3): qty 2

## 2019-07-08 MED ORDER — SENNA 8.6 MG PO TABS
1.0000 | ORAL_TABLET | Freq: Two times a day (BID) | ORAL | Status: DC
  Administered 2019-07-08 – 2019-07-10 (×4): 8.6 mg via ORAL
  Filled 2019-07-08 (×4): qty 1

## 2019-07-08 MED ORDER — ACETAMINOPHEN 160 MG/5ML PO SOLN: 325.0000 mg | Freq: Once | ORAL | Status: DC | PRN

## 2019-07-08 MED ORDER — METHOCARBAMOL 500 MG PO TABS
500.0000 mg | ORAL_TABLET | Freq: Four times a day (QID) | ORAL | Status: DC | PRN
  Administered 2019-07-09: 500 mg via ORAL
  Filled 2019-07-08 (×2): qty 1

## 2019-07-08 MED ORDER — METFORMIN HCL ER 500 MG PO TB24
500.0000 mg | ORAL_TABLET | Freq: Two times a day (BID) | ORAL | Status: DC
  Administered 2019-07-09 – 2019-07-11 (×5): 500 mg via ORAL
  Filled 2019-07-08 (×7): qty 1

## 2019-07-08 MED ORDER — SUCCINYLCHOLINE CHLORIDE 20 MG/ML IJ SOLN
INTRAMUSCULAR | Status: DC | PRN
  Administered 2019-07-08: 140 mg via INTRAVENOUS

## 2019-07-08 MED ORDER — KETOROLAC TROMETHAMINE 15 MG/ML IJ SOLN
15.0000 mg | Freq: Four times a day (QID) | INTRAMUSCULAR | Status: AC
  Administered 2019-07-08 – 2019-07-09 (×4): 15 mg via INTRAVENOUS
  Filled 2019-07-08 (×4): qty 1

## 2019-07-08 SURGICAL SUPPLY — 120 items
APPLIER CLIP 11 MED OPEN (CLIP) ×6
BAG DECANTER FOR FLEXI CONT (MISCELLANEOUS) ×6 IMPLANT
BASE TI BOLT 5.0X22.5 VARIABLE (Bolt) ×7 IMPLANT
BASE TI IMPLANT 6X38X28 10D (Neuro Prosthesis/Implant) ×1 IMPLANT
BASKET BONE COLLECTION (BASKET) IMPLANT
BENZOIN TINCTURE PRP APPL 2/3 (GAUZE/BANDAGES/DRESSINGS) ×3 IMPLANT
BIT DRILL LONG 3.0X30 (BIT) IMPLANT
BIT DRILL LONG 3X80 (BIT) IMPLANT
BIT DRILL LONG 4X80 (BIT) IMPLANT
BIT DRILL SHORT 3.0X30 (BIT) IMPLANT
BIT DRILL SHORT 3X80 (BIT) IMPLANT
BLADE SURG 11 STRL SS (BLADE) ×3 IMPLANT
BOLT BASE TI 5X20 VARIABLE (Bolt) ×2 IMPLANT
BONE MATRIX OSTEOCEL PRO LRG (Bone Implant) ×1 IMPLANT
BONE MATRIX OSTEOCEL PRO MED (Bone Implant) ×1 IMPLANT
BUR BARREL STRAIGHT FLUTE 4.0 (BURR) IMPLANT
BUR MATCHSTICK NEURO 3.0 LAGG (BURR) IMPLANT
CANISTER SUCT 3000ML PPV (MISCELLANEOUS) ×3 IMPLANT
CARTRIDGE OIL MAESTRO DRILL (MISCELLANEOUS) ×2 IMPLANT
CLIP APPLIE 11 MED OPEN (CLIP) ×4 IMPLANT
CLIP LIGATING EXTRA MED SLVR (CLIP) ×3 IMPLANT
CLIP LIGATING EXTRA SM BLUE (MISCELLANEOUS) ×3 IMPLANT
CLIP NEUROVISION LG (CLIP) ×1 IMPLANT
CNTNR URN SCR LID CUP LEK RST (MISCELLANEOUS) ×2 IMPLANT
CONT SPEC 4OZ STRL OR WHT (MISCELLANEOUS) ×3
COVER BACK TABLE 24X17X13 BIG (DRAPES) IMPLANT
COVER BACK TABLE 60X90IN (DRAPES) ×3 IMPLANT
COVER WAND RF STERILE (DRAPES) ×12 IMPLANT
DECANTER SPIKE VIAL GLASS SM (MISCELLANEOUS) ×3 IMPLANT
DERMABOND ADVANCED (GAUZE/BANDAGES/DRESSINGS) ×2
DERMABOND ADVANCED .7 DNX12 (GAUZE/BANDAGES/DRESSINGS) ×6 IMPLANT
DIFFUSER DRILL AIR PNEUMATIC (MISCELLANEOUS) ×3 IMPLANT
DRAPE C-ARM 42X72 X-RAY (DRAPES) ×6 IMPLANT
DRAPE C-ARMOR (DRAPES) ×3 IMPLANT
DRAPE LAPAROTOMY 100X72X124 (DRAPES) ×6 IMPLANT
DRAPE SHEET LG 3/4 BI-LAMINATE (DRAPES) ×3 IMPLANT
DURAPREP 26ML APPLICATOR (WOUND CARE) ×6 IMPLANT
ELECT BLADE 4.0 EZ CLEAN MEGAD (MISCELLANEOUS) ×6
ELECT BLADE 6.5 EXT (BLADE) IMPLANT
ELECT REM PT RETURN 9FT ADLT (ELECTROSURGICAL) ×6
ELECTRODE BLDE 4.0 EZ CLN MEGD (MISCELLANEOUS) ×4 IMPLANT
ELECTRODE REM PT RTRN 9FT ADLT (ELECTROSURGICAL) ×4 IMPLANT
GAUZE 4X4 16PLY RFD (DISPOSABLE) ×1 IMPLANT
GAUZE SPONGE 4X4 12PLY STRL (GAUZE/BANDAGES/DRESSINGS) ×3 IMPLANT
GLOVE BIO SURGEON STRL SZ7.5 (GLOVE) IMPLANT
GLOVE BIOGEL PI IND STRL 7.5 (GLOVE) IMPLANT
GLOVE BIOGEL PI IND STRL 8.5 (GLOVE) ×2 IMPLANT
GLOVE BIOGEL PI INDICATOR 7.5 (GLOVE)
GLOVE BIOGEL PI INDICATOR 8.5 (GLOVE) ×1
GLOVE ECLIPSE 8.5 STRL (GLOVE) ×9 IMPLANT
GLOVE EXAM NITRILE XL STR (GLOVE) IMPLANT
GLOVE SS BIOGEL STRL SZ 7.5 (GLOVE) ×2 IMPLANT
GLOVE SUPERSENSE BIOGEL SZ 7.5 (GLOVE) ×1
GOWN STRL REUS W/ TWL LRG LVL3 (GOWN DISPOSABLE) ×2 IMPLANT
GOWN STRL REUS W/ TWL XL LVL3 (GOWN DISPOSABLE) ×6 IMPLANT
GOWN STRL REUS W/TWL 2XL LVL3 (GOWN DISPOSABLE) ×12 IMPLANT
GOWN STRL REUS W/TWL LRG LVL3 (GOWN DISPOSABLE) ×3
GOWN STRL REUS W/TWL XL LVL3 (GOWN DISPOSABLE) ×9
GUIDEWIRE NITINOL BEVEL TIP (WIRE) ×8 IMPLANT
HEMOSTAT POWDER KIT SURGIFOAM (HEMOSTASIS) ×3 IMPLANT
IMPL BASE TI 6X38X28MM 15DE (Neuro Prosthesis/Implant) IMPLANT
IMPLANT BASE TI 6X38X28MM 15DE (Neuro Prosthesis/Implant) ×6 IMPLANT
INSERT FOGARTY 61MM (MISCELLANEOUS) IMPLANT
INSERT FOGARTY SM (MISCELLANEOUS) IMPLANT
KIT BASIN OR (CUSTOM PROCEDURE TRAY) ×6 IMPLANT
KIT SPINE MAZOR X ROBO DISP (MISCELLANEOUS) ×3 IMPLANT
KIT TURNOVER KIT B (KITS) ×3 IMPLANT
LOOP VESSEL MAXI BLUE (MISCELLANEOUS) IMPLANT
LOOP VESSEL MINI RED (MISCELLANEOUS) IMPLANT
MARKER SKIN DUAL TIP RULER LAB (MISCELLANEOUS) ×3 IMPLANT
MODULE NVM5 NEXT GEN EMG (NEEDLE) ×1 IMPLANT
NDL HYPO 25X1 1.5 SAFETY (NEEDLE) ×2 IMPLANT
NDL SPNL 18GX3.5 QUINCKE PK (NEEDLE) IMPLANT
NDL SPNL 22GX3.5 QUINCKE BK (NEEDLE) IMPLANT
NEEDLE HYPO 25X1 1.5 SAFETY (NEEDLE) ×3 IMPLANT
NEEDLE SPNL 18GX3.5 QUINCKE PK (NEEDLE) IMPLANT
NEEDLE SPNL 22GX3.5 QUINCKE BK (NEEDLE) ×3 IMPLANT
NS IRRIG 1000ML POUR BTL (IV SOLUTION) ×7 IMPLANT
OIL CARTRIDGE MAESTRO DRILL (MISCELLANEOUS) ×3
PACK LAMINECTOMY NEURO (CUSTOM PROCEDURE TRAY) ×6 IMPLANT
PAD ARMBOARD 7.5X6 YLW CONV (MISCELLANEOUS) ×15 IMPLANT
PIN HEAD 2.5X60MM (PIN) IMPLANT
ROD RELINE MAS LORD 5.5X100MM (Rod) ×1 IMPLANT
ROD RELINE MAS LORD 5.5X110MM (Rod) ×1 IMPLANT
SCREW LOCK RELINE 5.5 TULIP (Screw) ×8 IMPLANT
SCREW MAS RELINE POLY 6.5X40 (Screw) ×6 IMPLANT
SCREW RELINE MAS 7.5X35MM POLY (Screw) ×2 IMPLANT
SCREW SCHANZ SA 4.0MM (MISCELLANEOUS) IMPLANT
SPONGE INTESTINAL PEANUT (DISPOSABLE) ×9 IMPLANT
SPONGE LAP 18X18 RF (DISPOSABLE) ×3 IMPLANT
SPONGE LAP 4X18 RFD (DISPOSABLE) IMPLANT
SPONGE SURGIFOAM ABS GEL 100 (HEMOSTASIS) ×6 IMPLANT
SPONGE SURGIFOAM ABS GEL SZ50 (HEMOSTASIS) IMPLANT
STAPLER VISISTAT 35W (STAPLE) IMPLANT
STRIP CLOSURE SKIN 1/2X4 (GAUZE/BANDAGES/DRESSINGS) ×6 IMPLANT
SUT PDS AB 1 CTX 36 (SUTURE) ×3 IMPLANT
SUT PROLENE 4 0 RB 1 (SUTURE)
SUT PROLENE 4-0 RB1 .5 CRCL 36 (SUTURE) IMPLANT
SUT PROLENE 5 0 CC1 (SUTURE) IMPLANT
SUT PROLENE 6 0 C 1 30 (SUTURE) ×3 IMPLANT
SUT PROLENE 6 0 CC (SUTURE) IMPLANT
SUT SILK 0 TIES 10X30 (SUTURE) ×3 IMPLANT
SUT SILK 2 0 TIES 10X30 (SUTURE) ×3 IMPLANT
SUT SILK 2 0SH CR/8 30 (SUTURE) IMPLANT
SUT SILK 3 0 TIES 10X30 (SUTURE) ×3 IMPLANT
SUT SILK 3 0 TIES 17X18 (SUTURE)
SUT SILK 3 0SH CR/8 30 (SUTURE) IMPLANT
SUT SILK 3-0 18XBRD TIE BLK (SUTURE) IMPLANT
SUT VIC AB 0 CT1 27 (SUTURE) ×9
SUT VIC AB 0 CT1 27XBRD ANBCTR (SUTURE) ×6 IMPLANT
SUT VIC AB 1 CT1 18XBRD ANBCTR (SUTURE) ×2 IMPLANT
SUT VIC AB 1 CT1 8-18 (SUTURE) ×3
SUT VIC AB 2-0 CP2 18 (SUTURE) ×9 IMPLANT
SUT VIC AB 3-0 SH 8-18 (SUTURE) ×9 IMPLANT
SUT VIC AB 4-0 RB1 18 (SUTURE) ×4 IMPLANT
TOWEL GREEN STERILE (TOWEL DISPOSABLE) ×9 IMPLANT
TOWEL GREEN STERILE FF (TOWEL DISPOSABLE) ×12 IMPLANT
TRAY FOLEY MTR SLVR 16FR STAT (SET/KITS/TRAYS/PACK) ×6 IMPLANT
TUBE MAZOR SA REDUCTION (TUBING) ×3 IMPLANT
WATER STERILE IRR 1000ML POUR (IV SOLUTION) ×6 IMPLANT

## 2019-07-08 NOTE — H&P (Signed)
Karen Nixon is an 49 y.o. female.   Chief Complaint: Back pain and foot drop left leg HPI: Karen Nixon is a 49 year old individual who underwent surgery in 2005 for decompression of disc herniations at L3-4 and L4-5.  She did well for a number of years but in the past 2 years has developed significant weakness in the left lower extremity that evolved to a complete foot drop.  She has been seen and evaluated and noted to have advanced spondylosis at L3-4 L4-5 and at L5-S1.  After conservative treatment for the last several years and no response with worsening back pain and leg pain in addition to the foot drop that has gotten worse she is now being admitted to undergo surgical decompression and stabilization at L3-4 L4-5 and L5-S1 using a combined anterior and posterior procedure with anterior interbody arthrodesis at L3 445 and 5 1 and posterior fixation from L3 to the sacrum.  Past Medical History:  Diagnosis Date  . Anxiety   . DDD (degenerative disc disease), lumbar   . Degenerative disc disease, lumbar   . Depression    due to chronic pain  . Diabetes mellitus without complication ([place])   . Dislocation of shoulder    x 9  . GERD (gastroesophageal reflux disease)   . Glaucoma   . Hyperlipemia   . Hypothyroidism   . Left facial numbness 2010   and tingling  and left arm- years ago.  Dr says it was stress  . Scoliosis   . Shortness of breath dyspnea    with exertation  . Thyroid disease   . Umbilical hernia     Past Surgical History:  Procedure Laterality Date  . BACK SURGERY  2005  . CERVICAL CONIZATION W/BX    . ELBOW SURGERY Right 2007   fracture repair with screws  . INDUCED ABORTION    . SHOULDER ARTHROSCOPY Right 09/09/2014   Procedure: RIGHT SHOULDER ARTHROSCOPY WITH LABRAL REPAIR;  Surgeon: Mcarthur Rossetti, MD;  Location: [facility];  Service: Orthopedics;  Laterality: Right;  . SHOULDER SURGERY Right 2001   arthroscopy  . TUBAL LIGATION  11/20/1996    Family  History  Problem Relation Age of Onset  . Cancer Mother   . Hypertension Brother    Social History:  reports that she quit smoking about 7 months ago. Her smoking use included cigarettes. She has never used smokeless tobacco. She reports previous alcohol use. She reports that she does not use drugs.  Allergies:  Allergies  Allergen Reactions  . Lac Bovis Other (See Comments), Diarrhea, Nausea And Vomiting and Palpitations  . Iodinated Diagnostic Agents Other (See Comments) and Hives     Code: HIVES, Desc: Pt developed hives on neck, face and back with itching. Given 50mg  oral benadryl., Onset Date: [id number]   . Naproxen Sodium Hives    swelling per pt  . Iohexol      Code: HIVES, Desc: Pt developed hives on neck, face and back with itching. Given 50mg  oral benadryl., Onset Date: [id number]   . Lactose Intolerance (Gi) Diarrhea, Nausea And Vomiting and Palpitations    Medications Prior to Admission  Medication Sig Dispense Refill  . APPLE CIDER VINEGAR PO Take 1 capsule by mouth daily.    Marland Kitchen azelastine (ASTELIN) 0.1 % nasal spray Place 2 sprays into both nostrils 2 (two) times daily. Use in each nostril as directed 30 mL 5  . calcium elemental as carbonate (CALCIUM ANTACID ULTRA) 400 MG chewable tablet Chew  3,000 mg by mouth daily as needed for heartburn.    . cetirizine (ZYRTEC) 10 MG tablet Take 10 mg by mouth daily as needed for allergies.    Marland Kitchen ergocalciferol (VITAMIN D2) 1.25 MG (50000 UT) capsule Take 50,000 Units by mouth every Sunday.     . gabapentin (NEURONTIN) 300 MG capsule Take 1 capsule (300 mg total) by mouth 3 (three) times daily. 90 capsule 1  . Ketotifen Fumarate (ALAWAY OP) Place 1 drop into both eyes daily as needed (allergies).    . latanoprost (XALATAN) 0.005 % ophthalmic solution Place 1 drop into both eyes at bedtime.     Marland Kitchen levothyroxine (SYNTHROID) 100 MCG tablet Take 1 tablet (100 mcg total) by mouth daily before breakfast. 30 tablet 1  . lisinopril (ZESTRIL) 5  MG tablet Take 5 mg by mouth daily.    . metFORMIN (GLUCOPHAGE-XR) 500 MG 24 hr tablet Take 500 mg by mouth in the morning and at bedtime.    . Multiple Vitamin (MULTIVITAMIN WITH MINERALS) TABS tablet Take 2 tablets by mouth daily.    Marland Kitchen OVER THE COUNTER MEDICATION Take 1 tablet by mouth daily as needed (gas). Equate Gas and Bloating prevention    . rosuvastatin (CRESTOR) 20 MG tablet Take 20 mg by mouth daily.    Marland Kitchen tiZANidine (ZANAFLEX) 2 MG tablet Take 2 mg by mouth at bedtime as needed for muscle spasms.     Marland Kitchen EPINEPHrine 0.3 mg/0.3 mL IJ SOAJ injection Inject 0.3 mg into the muscle as needed for anaphylaxis.    Marland Kitchen ibuprofen (ADVIL) 600 MG tablet Take 600 mg by mouth every 6 (six) hours as needed for moderate pain.    . Simethicone 180 MG CAPS Take 2 capsules by mouth as needed.      Results for orders placed or performed during the hospital encounter of 07/08/19 (from the past 48 hour(s))  Glucose, capillary     Status: None   Collection Time: 07/08/19  5:57 AM  Result Value Ref Range   Glucose-Capillary 98 70 - 99 mg/dL    Comment: Glucose reference range applies only to samples taken after fasting for at least 8 hours.   No results found.  Review of Systems  Constitutional: Positive for activity change.  HENT: Negative.   Eyes: Negative.   Respiratory: Negative.   Cardiovascular: Negative.   Gastrointestinal: Negative.   Endocrine: Negative.   Genitourinary: Negative.   Musculoskeletal: Positive for back pain and myalgias.  Allergic/Immunologic: Negative.   Neurological: Positive for weakness and numbness.  Hematological: Negative.   Psychiatric/Behavioral: Negative.     Blood pressure 124/85, pulse 88, temperature (!) 97.1 F (36.2 C), temperature source Oral, resp. rate 18, height 5\' 5"  (1.651 m), weight 85.7 kg, SpO2 98 %. Physical Exam  Constitutional: She is oriented to person, place, and time. She appears well-developed and well-nourished.  HENT:  Head:  Normocephalic and atraumatic.  Eyes: Pupils are equal, round, and reactive to light. Conjunctivae and EOM are normal.  Cardiovascular: Normal rate and regular rhythm.  Respiratory: Effort normal and breath sounds normal.  GI: Soft. Bowel sounds are normal.  Musculoskeletal:     Cervical back: Normal range of motion and neck supple.     Comments: Positive straight leg raising on the left at 15 degrees, positive straight leg raising on the right at 30 degrees.  Patrick's maneuver is negative bilaterally.  Neurological: She is oriented to person, place, and time.  Absent deep tendon reflexes in the left lower  extremity in the patella and the Achilles trace reflex on the right side and the Achilles 2+ reflex in the left side of the patellae.  Sensation is diminished in the left lower extremity to pin and light touch.  Upper extremity strength and reflexes are intact cranial nerve examination is normal  Skin: Skin is warm and dry.  Psychiatric: She has a normal mood and affect. Her behavior is normal. Judgment and thought content normal.     Assessment/Plan Spondylosis and stenosis L3-4 L4-5 L5-S1, lumbar radiculopathy, foot drop on left.  Plan: Anterior decompression with interbody arthrodesis, posterior fixation from L3 to the sacrum with robotic assistance and screw placement.  Earleen Newport, MD 07/08/2019, 6:38 AM

## 2019-07-08 NOTE — Anesthesia Preprocedure Evaluation (Addendum)
Anesthesia Evaluation  Patient identified by MRN, date of birth, ID band Patient awake    Reviewed: Allergy & Precautions, NPO status , Patient's Chart, lab work & pertinent test results  Airway Mallampati: II  TM Distance: >3 FB Neck ROM: Full    Dental  (+) Teeth Intact, Dental Advisory Given   Pulmonary former smoker,    breath sounds clear to auscultation       Cardiovascular hypertension, Pt. on medications  Rhythm:Regular Rate:Normal     Neuro/Psych PSYCHIATRIC DISORDERS Anxiety Depression negative neurological ROS     GI/Hepatic Neg liver ROS, GERD  ,  Endo/Other  diabetes, Type 2, Oral Hypoglycemic AgentsHypothyroidism   Renal/GU      Musculoskeletal  (+) Arthritis ,   Abdominal Normal abdominal exam  (+)   Peds  Hematology negative hematology ROS (+)   Anesthesia Other Findings   Reproductive/Obstetrics                            Anesthesia Physical Anesthesia Plan  ASA: II  Anesthesia Plan: General   Post-op Pain Management:    Induction: Intravenous  PONV Risk Score and Plan: 4 or greater and Ondansetron, Dexamethasone, Midazolam and Scopolamine patch - Pre-op  Airway Management Planned: Oral ETT  Additional Equipment: Arterial line  Intra-op Plan:   Post-operative Plan: Extubation in OR  Informed Consent: I have reviewed the patients History and Physical, chart, labs and discussed the procedure including the risks, benefits and alternatives for the proposed anesthesia with the patient or authorized representative who has indicated his/her understanding and acceptance.     Dental advisory given  Plan Discussed with: CRNA  Anesthesia Plan Comments: (2 large bore IV's)       Anesthesia Quick Evaluation

## 2019-07-08 NOTE — Op Note (Signed)
    OPERATIVE REPORT  DATE OF SURGERY: 07/08/2019  PATIENT: Karen Nixon, 49 y.o. female MRN: [id number]  DOB: 1970/07/10  PRE-OPERATIVE DIAGNOSIS: Degenerative disc disease  POST-OPERATIVE DIAGNOSIS:  Same  PROCEDURE: Anterior exposure for L3-4, L4-5 and L5-S1 disc fusion with Dr. Ellene Route  SURGEON:  Curt Jews, M.D.  Co-surgeon for the exposure Dr. Ellene Route  ANESTHESIA: General  EBL: per anesthesia record  Total I/O In: 1950 [I.V.:1700; IV Piggyback:250] Out: 550 [Urine:250; Blood:300]  BLOOD ADMINISTERED: none  DRAINS: none  SPECIMEN: none  COUNTS CORRECT:  YES  PATIENT DISPOSITION:  PACU - hemodynamically stable  PROCEDURE DETAILS: Patient was taken operating placed supine position with area of the abdomen was prepped and draped in usual sterile fashion.  C-arm was brought into the field and a crosstable lateral was used to identify the level of the lumbar disc to be treated.  A left paramedian incision was made and carried down through the subcutaneous fat with electrocautery.  Anterior rectus sheath was opened in line with the skin incision.  The rectus muscle was mobilized to the right.  The retroperitoneal space was entered bluntly and the posterior sheath was opened laterally.  Blunt dissection was continued above the level of the psoas muscle.  The left ureter was reflected to the right.  Initially the L5-S1 disc were exposed by mobilizing the iliac vessels to the right and left.  The middle sacral vessels were clipped and divided.  Next the left iliac vessels were mobilized to the right and the L4-5 disc was exposed.  The iliolumbar vein was ligated with 2-0 silk ties and divided.  The lumbar artery and vein at the level of the L3-4 disc were clipped and divided.  This allowed blunt dissection of the arterial and venous structures to the right.  The Thompson retractor was brought onto the field and the reverse lip 150 blades were positioned to the right and left of the  L3-4 disc.  Malleable retractors were used for superior and inferior exposure.  A spinal needle was placed in the 3 4 disc and C arm was brought back onto the field to confirm that this was the appropriate level.  The remainder the procedure will be dictated as a separate note by Dr. Magda Kiel, M.D., [facility] 07/08/2019 12:44 PM

## 2019-07-08 NOTE — Transfer of Care (Signed)
Immediate Anesthesia Transfer of Care Note  Patient: Karen Nixon  Procedure(s) Performed: Lumbar Three-Four Lumbar Four-Five Lumbar Five Sacral One Anterior lumbar interbody fusion (N/A Spine Lumbar) Posterior percutaneous pedicle screw fixation Lumbar Three to Sacral One (N/A Spine Lumbar) APPLICATION OF ROBOTIC ASSISTANCE FOR SPINAL PROCEDURE (N/A ) ABDOMINAL EXPOSURE (N/A )  Patient Location: PACU  Anesthesia Type:General  Level of Consciousness: drowsy  Airway & Oxygen Therapy: Patient Spontanous Breathing and Patient connected to face mask oxygen  Post-op Assessment: Report given to RN, Post -op Vital signs reviewed and stable and Patient moving all extremities  Post vital signs: Reviewed and stable  Last Vitals:  Vitals Value Taken Time  BP 93/82 07/08/19 1513  Temp 36.6 C 07/08/19 1510  Pulse 74 07/08/19 1516  Resp 15 07/08/19 1516  SpO2 95 % 07/08/19 1516  Vitals shown include unvalidated device data.  Last Pain:  Vitals:   07/08/19 1510  TempSrc:   PainSc: Asleep      Patients Stated Pain Goal: 3 ([phone] [phone])  Complications: No apparent anesthesia complications

## 2019-07-08 NOTE — Anesthesia Procedure Notes (Signed)
Arterial Line Insertion Start/End5/11/2019 8:03 AM, 07/08/2019 8:05 AM Performed by: Effie Berkshire, MD, anesthesiologist  Patient location: Pre-op. Preanesthetic checklist: patient identified, IV checked, site marked, risks and benefits discussed, surgical consent, monitors and equipment checked, pre-op evaluation, timeout performed and anesthesia consent Lidocaine 1% used for infiltration Left, radial was placed Catheter size: 20 Fr Hand hygiene performed  and maximum sterile barriers used   Attempts: 1 Procedure performed without using ultrasound guided technique. Following insertion, dressing applied. Post procedure assessment: normal and unchanged  Patient tolerated the procedure well with no immediate complications.

## 2019-07-08 NOTE — Anesthesia Procedure Notes (Signed)
Procedure Name: Intubation Date/Time: 07/08/2019 7:48 AM Performed by: Luther Newhouse T, CRNA Pre-anesthesia Checklist: Patient identified, Emergency Drugs available, Suction available and Patient being monitored Patient Re-evaluated:Patient Re-evaluated prior to induction Oxygen Delivery Method: Circle system utilized Preoxygenation: Pre-oxygenation with 100% oxygen Induction Type: IV induction Ventilation: Mask ventilation without difficulty Laryngoscope Size: Miller and 3 Grade View: Grade III Tube type: Oral Tube size: 7.5 mm Number of attempts: 1 Airway Equipment and Method: Stylet and Oral airway Placement Confirmation: ETT inserted through vocal cords under direct vision,  positive ETCO2 and breath sounds checked- equal and bilateral Secured at: 22 cm Tube secured with: Tape Dental Injury: Teeth and Oropharynx as per pre-operative assessment

## 2019-07-08 NOTE — Op Note (Signed)
Date of operation: 07/08/2019 Preoperative diagnosis: Lumbar spondylosis with stenosis and left lumbar radiculopathy. Postoperative diagnosis: Same Procedure: Posterior segmental fixation L3 to the sacrum with robotic assistance placing pedicle screws in L3-L4-L5 and the sacrum precontoured rod fixation.  EMG monitoring Surgeon: [name] First Assistant: Deri Fuelling, MD Anesthesia: General endotracheal Indications: Karen Nixon is a 49 year old individual who has had previous surgical decompression in the lumbar spine she has developed progressive degenerative changes and developed a foot drop about a year ago.  Is been advised regarding surgical decompression via an anterior procedure which has been performed and now posterior stabilization procedure is being performed with pedicle screw fixation.  Procedure: Patient had an anterior interbody arthrodesis at L3-4 L4-5 and L5-S1 completed and she was now while under anesthesia turned prone onto a Jackson table and the bony prominences were carefully padded and protected.  The back was prepped with alcohol DuraPrep and draped in a sterile fashion.  The robotic arm was attached to the [place] table and then the procedure was started by placing a Steinmann pin into the left posterior suprailiac crest using a pin driver.  The robotic arm was then attached to this pin and the registration markers were attached to the arm to obtain some registration films in AP and oblique projection.  The computer then correlated the images with the CT scan and a preoperative plan was approved were pedicle screws at L3-L4-L5 and the sacrum had been chosen based on the CT findings.  Once the registration was complete we used the robotic arm to choose guide holes to place K wires into L3-L4-L5 and the sacrum.  This was done with first drawing the chosen entry sites through the skin so that a minimum of his incisions could be made and ultimately 4 incisions were required for  placing the 8 screws.  The K wires were placed into the pedicles using the robotic arm for guidance and once completed we then placed 7.5 x 35 mm screws in the sacrum using EMG monitoring to make sure there is no evidence of any cut out.  At L5 6.5 x 40 mm screws were placed at L4 6.5 x 40 mm screws were placed in L3 6.5 x 40 mm screws were placed all the placement of the screws went without incident.  Once the screws were placed and EMG monitoring revealed no evidence of any cut out radiographs were obtained which identified good placement of the screws and then with the towers attached a precontoured rod was passed on the left side measuring 110 mm in length and on the right side measuring 100 mm in length these were then tied down in a neutral construct.  The towers were left intact before the final radiographs were obtained to make sure that all the rods had passed through adequately and when verified the towers were removed.  Hemostasis in the soft tissues was obtained meticulously and then 2-0 Vicryl was used to close the fascia 3-0 Vicryl to close the subcutaneous tissues and 4-0 Vicryl in a subcuticular skin blood loss for this portion of the procedure was approximately 50 cc.  Patient tolerated procedure well and was then returned to recovery room in stable condition.

## 2019-07-08 NOTE — Op Note (Signed)
Date of surgery: 07/08/2019 Preoperative diagnosis: Spondylosis with stenosis and lumbar radiculopathy L3-4 L4-5 L5-S1, left foot drop. Postoperative diagnosis: Same Procedure: Anterior decompression L3-4 L4-5 and L5-S1 arthrodesis with titanium spacer allograft using osteocell. Surgeon: [name] Approach: Sherren Mocha early MD Anesthesia: General endotracheal Indications the patient is a 49 year old individual whose had a previous laminectomy and decompression she has developed progressive weakness and pain in the left lower extremity and across the low back and has advanced spondylitic disease at L3-4 L4-5 and L5-S1.  After careful consideration of her options I advised anterior decompression arthrodesis she is to have supplemental fixation placed posteriorly to stabilize her spine from L3 to the sacrum.  Anterior lumbar interbody arthrodeses are now being performed.  Procedure: The patient was brought to the operating room supine on the stretcher after the smooth induction of general tracheal anesthesia the skin was marked using fluoroscopy to identify the pads of the L3-4 the L4-5 and L5-S1 interspaces.  Dr. Donnetta Hutching started the procedure by making a paramedian incision on the left side and dissecting down to the retroperitoneal space once he had isolated the disc spaces we had placed the [facility] retractor and I started my portion of the procedure at L3-L4 opening the anterior longitudinal ligament and removing it to enter the disc space and remove a substantial quantity of degenerated disc material combination of curettes and rongeurs were used to evacuate the disc of all its contents.  As the region of the posterior longitudinal ligament was reached thickened redundant ligamentous material was removed but the outer layer of the ligament was allowed to remain intact.  Significant bony osteophytes from the inferior margin of body of L3 were removed endplates were shaved smoothed to allow adequate decortication  and then a series of spacers were placed and ultimately a 6 x 38 x 28 mm 15 degree lordotic spacer was chosen for L3-4.  This was filled with ostia cell and placed under fluoroscopic guidance.  Three 5.0 x 20 2.5 millimeters screws were used to secure this in place.  Attention was then turned to L4-5 where a similar process was carried out and here a similar size spacer was felt to fit best in the interspace once it was adequately decorticated and decompressed 6 x 38 x 28 mm spacer with 15 degrees of lordosis was placed into the L4-5 interspace after being packed with osteocell.  Again 3 , 5 x 22.5 millimeters screws were used to secure it in place at L4-L5.  At L5-S1 the disc space was noted to be considerably tighter in the left lateral recess was much more stenotic decompressing this required substantial drilling of the posterior osteophytes and distracting of the disc space only yielded an opening big enough to place a 6 mm x 38 mm x 28 mm spacer with 10 degrees of lordosis this was also filled with ostia cell and again secured with a singular 5 x 20 2.5 millimeters screw in the sacrum and to 5 x 20 mm screws in L5.  Once all the hardware was placed hemostasis in the soft tissues was obtained meticulously and the wound was checked for hemostasis stasis subcutaneously and the anterior rectus fascia was closed with #1 PDS in a running fashion.  2-0 Vicryl was used in subcutaneous tissues and then 3-0 Vicryl in the superficial subcutaneous layer and 4-0 Vicryl in the subcuticular skin blood loss for the procedure was estimated less than 100 cc the patient was then readied to be placed prone onto a  Glennon Mac table for the second part of the operation which will be dictated separately.

## 2019-07-09 LAB — POCT I-STAT, CHEM 8
BUN: 13 mg/dL (ref 6–20)
Calcium, Ion: 1.27 mmol/L (ref 1.15–1.40)
Chloride: 107 mmol/L (ref 98–111)
Creatinine, Ser: 0.6 mg/dL (ref 0.44–1.00)
Glucose, Bld: 121 mg/dL — ABNORMAL HIGH (ref 70–99)
HCT: 28 % — ABNORMAL LOW (ref 36.0–46.0)
Hemoglobin: 9.5 g/dL — ABNORMAL LOW (ref 12.0–15.0)
Potassium: 3.9 mmol/L (ref 3.5–5.1)
Sodium: 143 mmol/L (ref 135–145)
TCO2: 25 mmol/L (ref 22–32)

## 2019-07-09 LAB — BASIC METABOLIC PANEL
Anion gap: 9 (ref 5–15)
BUN: 17 mg/dL (ref 6–20)
CO2: 23 mmol/L (ref 22–32)
Calcium: 8.9 mg/dL (ref 8.9–10.3)
Chloride: 110 mmol/L (ref 98–111)
Creatinine, Ser: 1 mg/dL (ref 0.44–1.00)
GFR calc Af Amer: >60 mL/min (ref >60)
GFR calc non Af Amer: >60 mL/min (ref >60)
Glucose, Bld: 110 mg/dL — ABNORMAL HIGH (ref 70–99)
Potassium: 4.1 mmol/L (ref 3.5–5.1)
Sodium: 142 mmol/L (ref 135–145)

## 2019-07-09 LAB — GLUCOSE, CAPILLARY
Glucose-Capillary: 122 mg/dL — ABNORMAL HIGH (ref 70–99)
Glucose-Capillary: 149 mg/dL — ABNORMAL HIGH (ref 70–99)
Glucose-Capillary: 66 mg/dL — ABNORMAL LOW (ref 70–99)
Glucose-Capillary: 101 mg/dL — ABNORMAL HIGH (ref 70–99)
Glucose-Capillary: 112 mg/dL — ABNORMAL HIGH (ref 70–99)

## 2019-07-09 LAB — CBC
HCT: 28.4 % — ABNORMAL LOW (ref 36.0–46.0)
Hemoglobin: 8.8 g/dL — ABNORMAL LOW (ref 12.0–15.0)
MCH: 28.3 pg (ref 26.0–34.0)
MCHC: 31 g/dL (ref 30.0–36.0)
MCV: 91.3 fL (ref 80.0–100.0)
Platelets: 219 10*3/uL (ref 150–400)
RBC: 3.11 MIL/uL — ABNORMAL LOW (ref 3.87–5.11)
RDW: 14.1 % (ref 11.5–15.5)
WBC: 7.5 10*3/uL (ref 4.0–10.5)
nRBC: 0 % (ref 0.0–0.2)

## 2019-07-09 MED ORDER — DIPHENHYDRAMINE HCL 25 MG PO CAPS
25.0000 mg | ORAL_CAPSULE | Freq: Four times a day (QID) | ORAL | Status: DC | PRN
  Administered 2019-07-09 – 2019-07-11 (×2): 25 mg via ORAL
  Filled 2019-07-09 (×2): qty 1

## 2019-07-09 MED ORDER — HYDROCODONE-ACETAMINOPHEN 5-325 MG PO TABS
1.0000 | ORAL_TABLET | ORAL | Status: DC | PRN
  Filled 2019-07-09: qty 2

## 2019-07-09 MED ORDER — OXYCODONE HCL 5 MG PO TABS
5.0000 mg | ORAL_TABLET | ORAL | Status: DC | PRN
  Administered 2019-07-09 – 2019-07-11 (×11): 10 mg via ORAL
  Filled 2019-07-09 (×11): qty 2

## 2019-07-09 MED ORDER — HYDROXYZINE HCL 50 MG/ML IM SOLN
50.0000 mg | Freq: Four times a day (QID) | INTRAMUSCULAR | Status: DC | PRN
  Administered 2019-07-09: 50 mg via INTRAMUSCULAR
  Filled 2019-07-09: qty 1

## 2019-07-09 MED ORDER — CYCLOBENZAPRINE HCL 10 MG PO TABS
10.0000 mg | ORAL_TABLET | Freq: Three times a day (TID) | ORAL | Status: DC | PRN
  Administered 2019-07-10 (×3): 10 mg via ORAL
  Filled 2019-07-09 (×3): qty 1

## 2019-07-09 MED FILL — Thrombin For Soln 5000 Unit: CUTANEOUS | Qty: 5000 | Status: AC

## 2019-07-09 NOTE — Progress Notes (Signed)
Orthopedic Tech Progress Note Patient Details:  Karen Nixon 1970/04/02 [id number] RN said patient has BRACE Patient ID: Karen Nixon, female   DOB: Apr 02, 1970, 49 y.o.   MRN: [id number]   Karen Nixon 07/09/2019, 11:20 AM

## 2019-07-09 NOTE — Progress Notes (Signed)
Patient ID: Karen Nixon, female   DOB: 1970/12/23, 48 y.o.   MRN: [id number] Vital signs are stable Patient is having a moderate amount of back soreness Feels like truck is run over her I have advised that this is not a typical considering the nature of the surgery. She is ambulating She has been itchy secondary likely to the Percocet We will change this to hydrocodone Also appears to have acute blood loss anemia secondary to the surgery with hemoglobin going from 12.1 to 8.8 g Will ask diabetic coordinator see patient secondary to elevated blood sugars

## 2019-07-09 NOTE — Evaluation (Signed)
Physical Therapy Evaluation Patient Details Name: Karen Nixon MRN: [id number] DOB: 1970/10/24 Today's Date: 07/09/2019   History of Present Illness  Patient is a 49 y/o female who presents s/p L3-S1 ALIF and Posterior percutaneous pedicle screw fixation L3-S1 5/10. PMH includes HLD, glaucoma, DM, thyroid disease.  Clinical Impression  Patient presents with pain, post surgical deficits and left foot drop s/p above. Pt reports being Mod I using RW for ambulation and living alone PTA. Reports multiple tripping episodes but no complete LOB or fall. Pt has support from daughter and best friend PRN. Education re: back precautions, brace, positioning, log roll technique, walking program etc. Today, pt requires Min guard assist for bed mobility, transfers and gait training. Will plan for stair training next session as tolerated to prepare for d/c home. Will follow acutely to maximize independence and mobility prior to return home.    Follow Up Recommendations Home health PT;Supervision for mobility/OOB    Equipment Recommendations  None recommended by PT    Recommendations for Other Services       Precautions / Restrictions Precautions Precautions: Back Precaution Booklet Issued: Yes (comment) Precaution Comments: Reviewed precautions and handout Required Braces or Orthoses: Spinal Brace Spinal Brace: Lumbar corset;Applied in sitting position Restrictions Weight Bearing Restrictions: No      Mobility  Bed Mobility Overal bed mobility: Needs Assistance Bed Mobility: Rolling;Sidelying to Sit Rolling: Modified independent (Device/Increase time) Sidelying to sit: Supervision;HOB elevated     Sit to sidelying: Supervision;HOB elevated General bed mobility comments: Cues for log roll technique; increased time and effort. use of rail.  Transfers Overall transfer level: Needs assistance Equipment used: Rolling walker (2 wheeled) Transfers: Sit to/from Stand Sit to Stand: Min guard          General transfer comment: Min guard for safety. Stood from [employer], cues for hand placement.  Ambulation/Gait Ambulation/Gait assistance: Min guard Gait Distance (Feet): 120 Feet Assistive device: Rolling walker (2 wheeled) Gait Pattern/deviations: Step-through pattern;Decreased stride length;Steppage;Decreased step length - left Gait velocity: decreased Gait velocity interpretation: <1.8 ft/sec, indicate of risk for recurrent falls General Gait Details: Very slow, steady gait with decreased foot clearance LLE with steppage like pattern to advance LE. Cues to increase gait speed.  Stairs            Wheelchair Mobility    Modified Rankin (Stroke Patients Only)       Balance Overall balance assessment: Needs assistance Sitting-balance support: No upper extremity supported;Feet supported Sitting balance-Leahy Scale: Fair Sitting balance - Comments: Able to don brace sitting EOB with setup   Standing balance support: During functional activity Standing balance-Leahy Scale: Fair Standing balance comment: Able to adjust brace in standing with supervision.                             Pertinent Vitals/Pain Pain Assessment: 0-10 Pain Score: 5  Pain Location: groin and incision Pain Descriptors / Indicators: Sore;Constant;Pressure;Sharp Pain Intervention(s): Repositioned;Monitored during session    Home Living Family/patient expects to be discharged to:: Private residence Living Arrangements: Alone Available Help at Discharge: Family;Friend(s);Neighbor;Available PRN/intermittently Type of Home: House Home Access: Stairs to enter Entrance Stairs-Rails: None Entrance Stairs-Number of Steps: 2 + 2 +1 Home Layout: Two level Home Equipment: Walker - 2 wheels;Grab bars - tub/shower;Shower seat Additional Comments: 2 RWs    Prior Function Level of Independence: Independent with assistive device(s)         Comments: Pt using RW  PTA, has 2, one for  upstairs and one for downstairs. Does her own ADLs. Reports multiple tripping episodes due to left foot drop but no actual falls. Has support from daughter who works as a [employer] manager.     Hand Dominance   Dominant Hand: Right    Extremity/Trunk Assessment   Upper Extremity Assessment Upper Extremity Assessment: Defer to OT evaluation    Lower Extremity Assessment Lower Extremity Assessment: LLE deficits/detail LLE Deficits / Details: Grossly ~2-/5 DF. LLE Sensation: WNL    Cervical / Trunk Assessment Cervical / Trunk Assessment: Other exceptions Cervical / Trunk Exceptions: s/p back surgery  Communication   Communication: No difficulties  Cognition Arousal/Alertness: Awake/alert Behavior During Therapy: WFL for tasks assessed/performed Overall Cognitive Status: Within Functional Limits for tasks assessed                                 General Comments: pt demonstrated good adherence to back precautions and good safety awareness      General Comments      Exercises Other Exercises Other Exercises: educated pt on environmental modifications to maximize adherence to back precautions (setting items at countertop height)   Assessment/Plan    PT Assessment Patient needs continued PT services  PT Problem List Decreased strength;Decreased mobility;Pain;Decreased skin integrity;Decreased knowledge of precautions       PT Treatment Interventions Therapeutic activities;Gait training;Therapeutic exercise;Patient/family education;Balance training;Functional mobility training;Stair training    PT Goals (Current goals can be found in the Care Plan section)  Acute Rehab PT Goals Patient Stated Goal: to be able to ride bikes with my grandson PT Goal Formulation: With patient Time For Goal Achievement: 07/23/19 Potential to Achieve Goals: Good    Frequency Min 5X/week   Barriers to discharge Decreased caregiver support;Inaccessible home environment stairs, lives alone     Co-evaluation               AM-PAC PT "6 Clicks" Mobility  Outcome Measure Help needed turning from your back to your side while in a flat bed without using bedrails?: A Little Help needed moving from lying on your back to sitting on the side of a flat bed without using bedrails?: A Little Help needed moving to and from a bed to a chair (including a wheelchair)?: A Little Help needed standing up from a chair using your arms (e.g., wheelchair or bedside chair)?: A Little Help needed to walk in hospital room?: A Little Help needed climbing 3-5 steps with a railing? : A Little 6 Click Score: 18    End of Session Equipment Utilized During Treatment: Back brace;Gait belt Activity Tolerance: Patient tolerated treatment well Patient left: in bed;with call bell/phone within reach Nurse Communication: Mobility status PT Visit Diagnosis: Pain;Difficulty in walking, not elsewhere classified (R26.2) Pain - part of body: (back, groin)    Time: [id number] PT Time Calculation (min) (ACUTE ONLY): 34 min   Charges:   PT Evaluation $PT Eval Moderate Complexity: 1 Mod PT Treatments $Gait Training: 8-22 mins        Marisa Severin, PT, DPT Acute Rehabilitation Services Pager [phone] Office [place] 07/09/2019, 10:08 AM

## 2019-07-09 NOTE — Progress Notes (Signed)
Inpatient Diabetes Program Recommendations  AACE/ADA: New Consensus Statement on Inpatient Glycemic Control   Target Ranges:  Prepandial:   less than 140 mg/dL      Peak postprandial:   less than 180 mg/dL (1-2 hours)      Critically ill patients:  140 - 180 mg/dL   Results for Karen Nixon, Karen Nixon (MRN [id number]) as of 07/09/2019 14:39  Ref. Range 07/08/2019 05:57 07/08/2019 15:12 07/08/2019 22:05 07/09/2019 06:35 07/09/2019 12:12  Glucose-Capillary Latest Ref Range: 70 - 99 mg/dL 98 166 (H) 147 (H) 122 (H) 149 (H)  Results for Karen Nixon, Karen Nixon (MRN [id number]) as of 07/09/2019 14:39  Ref. Range 07/08/2019 22:25  Hemoglobin A1C Latest Ref Range: 4.8 - 5.6 % 6.0 (H)   Review of Glycemic Control  Diabetes history: DM2 Outpatient Diabetes medications: Metformin XR 500 mg BID Current orders for Inpatient glycemic control: Metformin XR 500 mg BID, Novolog 0-20 units TID with meals  NOTE: Noted consult for diabetes coordinator for postoperative blood sugar management. Chart reviewed. Noted patient has DM2 and takes Metformin outpatient. A1C 6% on 07/08/19 indicating an average glucose of 126 mg/dl over the past 2-3 months. Initial glucose 98 mg/dl on 07/08/19 at 5:57 am. Patient received Decadron 10 mg at 8:30 am on 07/08/19 which is contributing to hyperglycemia. CBGs today 122 mg/dl and 149 mg/dl which are within inpatient glycemic targets. Agree with current inpatient DM medications. Will follow along while inpatient.  Thanks, Barnie Alderman, RN, MSN, CDE Diabetes Coordinator Inpatient Diabetes Program [phone] (Team Pager from 8am to 5pm)

## 2019-07-09 NOTE — Evaluation (Signed)
Occupational Therapy Evaluation Patient Details Name: Karen Nixon MRN: [id number] DOB: [birth year] Today's Date: 07/09/2019    History of Present Illness Patient is a 49 y/o female who presents s/p L3-S1 ALIF and Posterior percutaneous pedicle screw fixation L3-S1 5/10. PMH includes HLD, glaucoma, DM, thyroid disease.   Clinical Impression   PTA, pt was living at home alone, pt was independent with ADL/IADL and modified independent with functional mobility at RW level. Pt currently requires minguard for bed mobility and minguard for functional mobility at RW level. She required minA for LB dressing, setupA for UB dressing. Due to decline in current level of function, pt would benefit from acute OT to address established goals to facilitate safe D/C to venue listed below. At this time, recommend HHOT follow-up. Will continue to follow acutely.     Follow Up Recommendations  Home health OT    Equipment Recommendations  3 in 1 bedside commode    Recommendations for Other Services       Precautions / Restrictions Precautions Precautions: Back Precaution Booklet Issued: Yes (comment) Precaution Comments: Reviewed precautions and handout Required Braces or Orthoses: Spinal Brace Spinal Brace: Lumbar corset;Applied in sitting position Restrictions Weight Bearing Restrictions: No      Mobility Bed Mobility Overal bed mobility: Needs Assistance Bed Mobility: Rolling;Sidelying to Sit;Sit to Sidelying Rolling: Modified independent (Device/Increase time) Sidelying to sit: Min guard     Sit to sidelying: Supervision General bed mobility comments: min guard to progress to sitting as pt was positioned closed to edge of bed;pt required increased time and effort to progress to EOB and return to supine, no physical assistance provided  Transfers Overall transfer level: Needs assistance Equipment used: Rolling walker (2 wheeled) Transfers: Sit to/from Stand Sit to Stand: Min guard         General transfer comment: minguard for safety and vc for safe hand placement    Balance Overall balance assessment: Needs assistance Sitting-balance support: No upper extremity supported;Feet supported Sitting balance-Leahy Scale: Fair     Standing balance support: Single extremity supported;During functional activity Standing balance-Leahy Scale: Fair Standing balance comment: demonstrated preference for single UE support;reliant on BUE support on RW during mobility                           ADL either performed or assessed with clinical judgement   ADL Overall ADL's : Needs assistance/impaired Eating/Feeding: Set up;Sitting   Grooming: Min guard;Standing   Upper Body Bathing: Sitting;Set up   Lower Body Bathing: Minimal assistance;Sit to/from stand;With adaptive equipment   Upper Body Dressing : Sitting;Set up Upper Body Dressing Details (indicate cue type and reason): pt donned and doffed back brace  Lower Body Dressing: Minimal assistance;Sit to/from stand;With adaptive equipment Lower Body Dressing Details (indicate cue type and reason): pt return demonstrated use of AE to don/doff socks Toilet Transfer: Min guard;Ambulation;RW   Toileting- Water quality scientist and Hygiene: Min guard;Sit to/from stand       Functional mobility during ADLs: Min guard;Rolling walker General ADL Comments: pt return demonstrated use of AE to assist with LB dressing;pt required minguard for functional mobiltiy at [facility] level      Vision         Perception     Praxis      Pertinent Vitals/Pain Pain Assessment: 0-10 Pain Score: 5  Pain Location: back incision site Pain Descriptors / Indicators: Sore;Constant;Pressure Pain Intervention(s): Limited activity within patient's tolerance;Monitored during session  Hand Dominance Right   Extremity/Trunk Assessment Upper Extremity Assessment Upper Extremity Assessment: Overall WFL for tasks assessed   Lower  Extremity Assessment Lower Extremity Assessment: Defer to PT evaluation   Cervical / Trunk Assessment Cervical / Trunk Assessment: Other exceptions Cervical / Trunk Exceptions: no bending, no lifting, no twisting   Communication Communication Communication: No difficulties   Cognition Arousal/Alertness: Awake/alert Behavior During Therapy: WFL for tasks assessed/performed Overall Cognitive Status: Within Functional Limits for tasks assessed                                 General Comments: pt demonstrated good adherence to back precautions and good safety awareness   General Comments       Exercises Exercises: Other exercises Other Exercises Other Exercises: educated pt on environmental modifications to maximize adherence to back precautions (setting items at countertop height)   Shoulder Instructions      Home Living Family/patient expects to be discharged to:: Private residence Living Arrangements: Alone Available Help at Discharge: Family;Friend(s);Neighbor;Available PRN/intermittently Type of Home: House Home Access: Stairs to enter [employer] of Steps: 4 Entrance Stairs-Rails: None Home Layout: Two level     Bathroom Shower/Tub: Teacher, early years/pre: Standard Bathroom Accessibility: Yes How Accessible: Accessible via walker Home Equipment: Walker - 2 wheels;Grab bars - tub/shower;Shower seat          Prior Functioning/Environment Level of Independence: Independent                 OT Problem List: Decreased activity tolerance;Decreased range of motion;Impaired balance (sitting and/or standing);Decreased safety awareness;Decreased knowledge of use of DME or AE;Decreased knowledge of precautions;Pain      OT Treatment/Interventions: Self-care/ADL training;Therapeutic exercise;DME and/or AE instruction;Therapeutic activities;Patient/family education;Balance training    OT Goals(Current goals can be found in the care  plan section) Acute Rehab OT Goals Patient Stated Goal: to get stronger and go home OT Goal Formulation: With patient Time For Goal Achievement: 07/23/19 Potential to Achieve Goals: Good ADL Goals Pt Will Perform Lower Body Dressing: with modified independence;sit to/from stand Pt Will Transfer to Toilet: with modified independence;ambulating Pt Will Perform Tub/Shower Transfer: Tub transfer;shower seat;ambulating;with modified independence Additional ADL Goal #1: Pt will demonstrate adherence to 3/3 back precautions during ADL/IADL and functional mobility. Additional ADL Goal #2: Pt will progress to EOB with modified independence in preparation for ADL.  OT Frequency: Min 2X/week   Barriers to D/C: Decreased caregiver support  pt lives alone       Co-evaluation              AM-PAC OT "6 Clicks" Daily Activity     Outcome Measure Help from another person eating meals?: A Little Help from another person taking care of personal grooming?: A Little Help from another person toileting, which includes using toliet, bedpan, or urinal?: A Little Help from another person bathing (including washing, rinsing, drying)?: A Little Help from another person to put on and taking off regular upper body clothing?: A Little Help from another person to put on and taking off regular lower body clothing?: A Little 6 Click Score: 18   End of Session Equipment Utilized During Treatment: Gait belt;Rolling walker;Back brace Nurse Communication: Mobility status  Activity Tolerance: Patient tolerated treatment well;Patient limited by pain Patient left: in bed;with call bell/phone within reach  OT Visit Diagnosis: Other abnormalities of gait and mobility (R26.89);Unsteadiness on feet (R26.81);Pain Pain - part  of body: (back)                Time: [id number] OT Time Calculation (min): 22 min Charges:  OT General Charges $OT Visit: 1 Visit OT Evaluation $OT Eval Moderate Complexity: [place]  OTR/L Acute Rehabilitation Services Office: [place] 07/09/2019, 9:54 AM

## 2019-07-09 NOTE — Progress Notes (Signed)
Hypoglycemic Event  CBG: 66  Treatment: 8 oz juice/soda  Symptoms: None  Follow-up CBG: Time:1824 CBG Result:101  Possible Reasons for Event: Unknown  Comments/MD notified:Hypoglycemic protocol    Tom-Johnson, Renea Ee

## 2019-07-09 NOTE — Anesthesia Postprocedure Evaluation (Signed)
Anesthesia Post Note  Patient: Karen Nixon  Procedure(s) Performed: Lumbar Three-Four Lumbar Four-Five Lumbar Five Sacral One Anterior lumbar interbody fusion (N/A Spine Lumbar) Posterior percutaneous pedicle screw fixation Lumbar Three to Sacral One (N/A Spine Lumbar) APPLICATION OF ROBOTIC ASSISTANCE FOR SPINAL PROCEDURE (N/A ) ABDOMINAL EXPOSURE (N/A )     Patient location during evaluation: PACU Anesthesia Type: General Level of consciousness: awake and alert Pain management: pain level controlled Vital Signs Assessment: post-procedure vital signs reviewed and stable Respiratory status: spontaneous breathing, nonlabored ventilation, respiratory function stable and patient connected to nasal cannula oxygen Cardiovascular status: blood pressure returned to baseline and stable Postop Assessment: no apparent nausea or vomiting Anesthetic complications: no    Last Vitals:  Vitals:   07/08/19 2310 07/09/19 0438  BP: 111/80 101/63  Pulse: (!) 101 (!) 102  Resp: 20 18  Temp: 37 C 37.3 C  SpO2: 96% 98%    Last Pain:  Vitals:   07/09/19 0511  TempSrc:   PainSc: [place]

## 2019-07-09 NOTE — Progress Notes (Signed)
  Progress Note    07/09/2019 6:34 AM 1 Day Post-Op  Subjective: Comfortable this morning.  Reports soreness in her left lower abdomen.  No nausea or vomiting   Vitals:   07/08/19 2310 07/09/19 0438  BP: 111/80 101/63  Pulse: (!) 101 (!) 102  Resp: 20 18  Temp: 98.6 F (37 C) 99.1 F (37.3 C)  SpO2: 96% 98%   Physical Exam: Abdomen soft and nontender.  2+ dorsalis pedis pulses bilaterally  CBC    Component Value Date/Time   WBC 7.0 07/04/2019 1205   RBC 4.26 07/04/2019 1205   HGB 12.1 07/04/2019 1205   HCT 38.8 07/04/2019 1205   PLT 271 07/04/2019 1205   MCV 91.1 07/04/2019 1205   MCH 28.4 07/04/2019 1205   MCHC 31.2 07/04/2019 1205   RDW 13.5 07/04/2019 1205   LYMPHSABS 1.1 09/20/2014 1920   MONOABS 0.6 09/20/2014 1920   EOSABS 0.0 09/20/2014 1920   BASOSABS 0.0 09/20/2014 1920    BMET    Component Value Date/Time   NA 142 07/04/2019 1205   K 3.9 07/04/2019 1205   CL 109 07/04/2019 1205   CO2 25 07/04/2019 1205   GLUCOSE 102 (H) 07/04/2019 1205   BUN 13 07/04/2019 1205   CREATININE 0.76 07/04/2019 1205   CALCIUM 9.3 07/04/2019 1205   GFRNONAA >60 07/04/2019 1205   GFRAA >60 07/04/2019 1205    INR No results found for: INR   Intake/Output Summary (Last 24 hours) at 07/09/2019 0634 Last data filed at 07/09/2019 0440 Gross per 24 hour  Intake 4093 ml  Output 2350 ml  Net 1743 ml     Assessment/Plan:  49 y.o. female stable status post anterior approach for lumbar fusion.  Doing well.  Has walked without difficulty.  Will not follow actively.  Please call if we can assist     Rosetta Posner, MD [facility] [phone] 07/09/2019 6:34 AM

## 2019-07-10 LAB — GLUCOSE, CAPILLARY
Glucose-Capillary: 95 mg/dL (ref 70–99)
Glucose-Capillary: 117 mg/dL — ABNORMAL HIGH (ref 70–99)
Glucose-Capillary: 114 mg/dL — ABNORMAL HIGH (ref 70–99)
Glucose-Capillary: 108 mg/dL — ABNORMAL HIGH (ref 70–99)

## 2019-07-10 MED ORDER — MAGNESIUM CITRATE PO SOLN
1.0000 | Freq: Once | ORAL | Status: AC
  Administered 2019-07-10: 1 via ORAL
  Filled 2019-07-10: qty 296

## 2019-07-10 MED ORDER — CELECOXIB 200 MG PO CAPS
200.0000 mg | ORAL_CAPSULE | Freq: Two times a day (BID) | ORAL | Status: DC
  Administered 2019-07-10 – 2019-07-11 (×3): 200 mg via ORAL
  Filled 2019-07-10 (×3): qty 1

## 2019-07-10 NOTE — Progress Notes (Signed)
Physical Therapy Treatment Patient Details Name: Karen Nixon MRN: [id number] DOB: Feb 28, 1971 Today's Date: 07/10/2019    History of Present Illness Patient is a 49 y/o female who presents s/p L3-S1 ALIF and Posterior percutaneous pedicle screw fixation L3-S1 5/10. PMH includes HLD, glaucoma, DM, thyroid disease.    PT Comments    Pt very slowly moving this session and greatly limited secondary to pain. Pt able to participate in stair training but with great difficulty ascending/descending secondary to pain. Pt would continue to benefit from skilled physical therapy services at this time while admitted and after d/c to address the below listed limitations in order to improve overall safety and independence with functional mobility.    Follow Up Recommendations  Home health PT;Supervision for mobility/OOB     Equipment Recommendations  None recommended by PT    Recommendations for Other Services       Precautions / Restrictions Precautions Precautions: Back Precaution Booklet Issued: Yes (comment) Precaution Comments: Reviewed precautions and handout Required Braces or Orthoses: Spinal Brace Spinal Brace: Lumbar corset;Applied in sitting position Restrictions Weight Bearing Restrictions: No    Mobility  Bed Mobility Overal bed mobility: Needs Assistance Bed Mobility: Rolling;Sidelying to Sit Rolling: Modified independent (Device/Increase time) Sidelying to sit: Supervision;HOB elevated       General bed mobility comments: supervision for safety, cueing for log roll  Transfers Overall transfer level: Needs assistance Equipment used: Rolling walker (2 wheeled) Transfers: Sit to/from Stand Sit to Stand: Min guard         General transfer comment: for safety, slow antalgic movement  Ambulation/Gait Ambulation/Gait assistance: Min guard Gait Distance (Feet): 120 Feet Assistive device: Rolling walker (2 wheeled) Gait Pattern/deviations: Step-through  pattern;Decreased stride length;Steppage;Decreased step length - left Gait velocity: decreased   General Gait Details: pt with very slow, cautious and guarded gait; greatly reduced speed and limited secondary to pain   Stairs Stairs: Yes Stairs assistance: Min guard Stair Management: One rail Left;Step to pattern;Sideways Number of Stairs: 2 General stair comments: pt initially ascending with bilateral hand rails, then practiced ascending/descending with one hand rail (L) like she has at home   Wheelchair Mobility    Modified Rankin (Stroke Patients Only)       Balance Overall balance assessment: Needs assistance Sitting-balance support: No upper extremity supported;Feet supported Sitting balance-Leahy Scale: Fair     Standing balance support: During functional activity;Bilateral upper extremity supported;Single extremity supported Standing balance-Leahy Scale: Poor Standing balance comment: reliant on single upper extremity                            Cognition Arousal/Alertness: Awake/alert Behavior During Therapy: WFL for tasks assessed/performed Overall Cognitive Status: Within Functional Limits for tasks assessed                                        Exercises      General Comments        Pertinent Vitals/Pain Pain Assessment: 0-10 Pain Score: 8  Pain Location: back incision site Pain Descriptors / Indicators: Sore;Constant;Pressure;Sharp Pain Intervention(s): Monitored during session;Repositioned    Home Living                      Prior Function            PT Goals (current goals can now be found  in the care plan section) Acute Rehab PT Goals Patient Stated Goal: to manage pain PT Goal Formulation: With patient Time For Goal Achievement: 07/23/19 Potential to Achieve Goals: Good Progress towards PT goals: Progressing toward goals    Frequency    Min 5X/week      PT Plan Current plan remains  appropriate    Co-evaluation              AM-PAC PT "6 Clicks" Mobility   Outcome Measure  Help needed turning from your back to your side while in a flat bed without using bedrails?: None Help needed moving from lying on your back to sitting on the side of a flat bed without using bedrails?: None Help needed moving to and from a bed to a chair (including a wheelchair)?: None Help needed standing up from a chair using your arms (e.g., wheelchair or bedside chair)?: None Help needed to walk in hospital room?: A Little Help needed climbing 3-5 steps with a railing? : A Little 6 Click Score: 22    End of Session Equipment Utilized During Treatment: Back brace;Gait belt Activity Tolerance: Patient limited by pain Patient left: Other (comment)(ambulating in hallway with OT) Nurse Communication: Mobility status PT Visit Diagnosis: Pain;Difficulty in walking, not elsewhere classified (R26.2) Pain - part of body: (back)     Time: [id number] PT Time Calculation (min) (ACUTE ONLY): 18 min  Charges:  $Gait Training: 8-22 mins                     Anastasio Champion, DPT  Acute Rehabilitation Services Pager [phone] Office [place] 07/10/2019, 10:38 AM

## 2019-07-10 NOTE — Progress Notes (Signed)
Inpatient Diabetes Program Recommendations  AACE/ADA: New Consensus Statement on Inpatient Glycemic Control   Target Ranges:  Prepandial:   less than 140 mg/dL      Peak postprandial:   less than 180 mg/dL (1-2 hours)      Critically ill patients:  140 - 180 mg/dL   Results for JERICCA, WILLERS (MRN [id number]) as of 07/10/2019 10:16  Ref. Range 07/09/2019 06:35 07/09/2019 12:12 07/09/2019 17:00 07/09/2019 18:24 07/09/2019 21:08 07/10/2019 06:32  Glucose-Capillary Latest Ref Range: 70 - 99 mg/dL 122 (H)  Novolog 3 units 149 (H)  Novolog 3 units 66 (L) 101 (H) 112 (H) 95   Review of Glycemic Control  Diabetes history: DM2 Outpatient Diabetes medications: Metformin XR 500 mg BID Current orders for Inpatient glycemic control: Metformin XR 500 mg BID, Novolog 0-20 units TID with meals  Inpatient Diabetes Program Recommendations:    Insulin-Correction: Please consider decreasing Novolog correction to 0-9 units TID with meals.  Thanks, Barnie Alderman, RN, MSN, CDE Diabetes Coordinator Inpatient Diabetes Program [phone] (Team Pager from 8am to 5pm)

## 2019-07-10 NOTE — Progress Notes (Addendum)
Occupational Therapy Treatment Patient Details Name: Karen Nixon MRN: [id number] DOB: 1970-03-18 Today's Date: 07/10/2019    History of present illness Patient is a 50 y/o female who presents s/p L3-S1 ALIF and Posterior percutaneous pedicle screw fixation L3-S1 5/10. PMH includes HLD, glaucoma, DM, thyroid disease.   OT comments  Pt significantly limited by pain this session. Pt required min guard for functional mobility at RW level. She was unable to clear height of tub during simulated tub transfer. Educated pt on sponge bathing until pain is managed, stability improves and range of motion increases. Per RN, pt does not qualify for [facility]. However, due to pt's limitations with ADL/IADL completion, continue to recommend HHOT. I do not recommend outpatient OT as commute to/from would be too much for pt to manage due to decreased endurance and activity tolerance and pain is not managed appropriately yet. Pt will continue to benefit from skilled OT services to maximize safety and independence with ADL/IADL and functional mobility. Will continue to follow acutely and progress as tolerated.    Follow Up Recommendations  Home health OT    Equipment Recommendations  3 in 1 bedside commode    Recommendations for Other Services      Precautions / Restrictions Precautions Precautions: Back Precaution Booklet Issued: Yes (comment) Precaution Comments: Reviewed precautions and handout Required Braces or Orthoses: Spinal Brace Spinal Brace: Lumbar corset;Applied in sitting position Restrictions Weight Bearing Restrictions: No       Mobility Bed Mobility               General bed mobility comments: pt in hallway with PT  Transfers Overall transfer level: Needs assistance Equipment used: Rolling walker (2 wheeled) Transfers: Sit to/from Stand Sit to Stand: Min guard         General transfer comment: minguard for safety    Balance Overall balance assessment: Needs  assistance Sitting-balance support: No upper extremity supported;Feet supported Sitting balance-Leahy Scale: Fair     Standing balance support: Single extremity supported;During functional activity Standing balance-Leahy Scale: Fair Standing balance comment: reliant on single upper extremity                           ADL either performed or assessed with clinical judgement   ADL Overall ADL's : Needs assistance/impaired                         Toilet Transfer: Min guard;RW   Toileting- Clothing Manipulation and Hygiene: Min guard;Sit to/from stand   Tub/ Shower Transfer: Minimal assistance;Tub transfer   Functional mobility during ADLs: Min guard;Rolling walker General ADL Comments: pt significantly limited by pain     Vision       Perception     Praxis      Cognition Arousal/Alertness: Awake/alert Behavior During Therapy: WFL for tasks assessed/performed Overall Cognitive Status: Within Functional Limits for tasks assessed                                          Exercises     Shoulder Instructions       General Comments      Pertinent Vitals/ Pain       Pain Assessment: 0-10 Pain Score: 8  Pain Location: back incision site Pain Descriptors / Indicators: Sore;Constant;Pressure;Sharp Pain Intervention(s): Limited activity within patient's tolerance;Monitored  during session  Home Living                                          Prior Functioning/Environment              Frequency  Min 2X/week        Progress Toward Goals  OT Goals(current goals can now be found in the care plan section)  Progress towards OT goals: Progressing toward goals  Acute Rehab OT Goals Patient Stated Goal: to manage pain OT Goal Formulation: With patient Time For Goal Achievement: 07/23/19 Potential to Achieve Goals: Good ADL Goals Pt Will Perform Lower Body Dressing: with modified independence;sit to/from  stand Pt Will Transfer to Toilet: with modified independence;ambulating Pt Will Perform Tub/Shower Transfer: Tub transfer;shower seat;ambulating;with modified independence Additional ADL Goal #1: Pt will demonstrate adherence to 3/3 back precautions during ADL/IADL and functional mobility. Additional ADL Goal #2: Pt will progress to EOB with modified independence in preparation for ADL.  Plan Discharge plan remains appropriate    Co-evaluation                 AM-PAC OT "6 Clicks" Daily Activity     Outcome Measure   Help from another person eating meals?: A Little Help from another person taking care of personal grooming?: A Little Help from another person toileting, which includes using toliet, bedpan, or urinal?: A Little Help from another person bathing (including washing, rinsing, drying)?: A Little Help from another person to put on and taking off regular upper body clothing?: A Little Help from another person to put on and taking off regular lower body clothing?: A Little 6 Click Score: 18    End of Session Equipment Utilized During Treatment: Gait belt;Rolling walker;Back brace  OT Visit Diagnosis: Other abnormalities of gait and mobility (R26.89);Unsteadiness on feet (R26.81);Pain Pain - part of body: (back)   Activity Tolerance Patient tolerated treatment well;Patient limited by pain   Patient Left in chair;with call bell/phone within reach   Nurse Communication Mobility status        Time: [id number] OT Time Calculation (min): 21 min  Charges: OT General Charges $OT Visit: 1 Visit OT Treatments $Self Care/Home Management : 8-22 mins  Helene Kelp OTR/L Acute Rehabilitation Services Office: [place] 07/10/2019, 10:30 AM

## 2019-07-10 NOTE — Progress Notes (Signed)
Patient ID: Karen Nixon, female   DOB: 04/06/1970, 49 y.o.   MRN: [id number] Vital signs are stable Patient having increased back pain and pain in the posterior superior iliac crest region on the left Motor function is stable Incisions are clean and dry Ambulating well No BM as of yet Continue to observe in hospital Plan discharge possibly for tomorrow

## 2019-07-11 LAB — GLUCOSE, CAPILLARY: Glucose-Capillary: 118 mg/dL — ABNORMAL HIGH (ref 70–99)

## 2019-07-11 MED ORDER — OXYCODONE HCL 5 MG PO TABS: 5.0000 mg | ORAL_TABLET | ORAL | 0 refills | Status: AC | PRN

## 2019-07-11 MED ORDER — TIZANIDINE HCL 2 MG PO TABS: 4.0000 mg | ORAL_TABLET | Freq: Four times a day (QID) | ORAL | 0 refills | Status: AC | PRN

## 2019-07-11 MED ORDER — INSULIN ASPART 100 UNIT/ML ~~LOC~~ SOLN: 0.0000 [IU] | Freq: Three times a day (TID) | SUBCUTANEOUS | Status: DC

## 2019-07-11 MED FILL — Sodium Chloride IV Soln 0.9%: INTRAVENOUS | Qty: 2000 | Status: AC

## 2019-07-11 MED FILL — Heparin Sodium (Porcine) Inj 1000 Unit/ML: INTRAMUSCULAR | Qty: 30 | Status: AC

## 2019-07-11 NOTE — Progress Notes (Signed)
Physical Therapy Treatment Patient Details Name: Karen Nixon MRN: [id number] DOB: 16-Jan-1971 Today's Date: 07/11/2019    History of Present Illness Patient is a 49 y/o female who presents s/p L3-S1 ALIF and Posterior percutaneous pedicle screw fixation L3-S1 5/10. PMH includes HLD, glaucoma, DM, thyroid disease.    PT Comments    Pt with significant improvement since yesterday in regards to pain and overall functional mobility. She was able to progress to gait training without the use of the RW and tolerated additional stair training this session without difficulties. PT provided pt education re: back precautions, car transfers and a generalized walking program for pt to initiate upon d/c home. Pt is ready to d/c home from a PT perspective.     Follow Up Recommendations  No PT follow up     Equipment Recommendations  None recommended by PT    Recommendations for Other Services       Precautions / Restrictions Precautions Precautions: Back Precaution Booklet Issued: No Precaution Comments: Reviewed precautions and handout Required Braces or Orthoses: Spinal Brace Spinal Brace: Lumbar corset;Applied in sitting position Restrictions Weight Bearing Restrictions: No    Mobility  Bed Mobility               General bed mobility comments: pt seated in chair upon arrival  Transfers Overall transfer level: Modified independent Equipment used: None                Ambulation/Gait Ambulation/Gait assistance: Supervision Gait Distance (Feet): 500 Feet Assistive device: None Gait Pattern/deviations: Decreased stride length;Step-through pattern Gait velocity: decreased   General Gait Details: pt with greatly improved gait speed and stability; able to progress to ambulation without use of an AD; no instability or LOB, no need for physical assistance   Stairs Stairs: Yes Stairs assistance: Min guard Stair Management: One rail Left;Step to pattern;Forwards Number  of Stairs: 10 General stair comments: no instability or LOB, no need for physical assistance   Wheelchair Mobility    Modified Rankin (Stroke Patients Only)       Balance Overall balance assessment: Mild deficits observed, not formally tested                                          Cognition Arousal/Alertness: Awake/alert Behavior During Therapy: WFL for tasks assessed/performed Overall Cognitive Status: Within Functional Limits for tasks assessed                                        Exercises      General Comments        Pertinent Vitals/Pain Pain Assessment: No/denies pain Faces Pain Scale: Hurts a little bit Pain Location: back incision site Pain Descriptors / Indicators: Aching Pain Intervention(s): Monitored during session    Home Living                      Prior Function            PT Goals (current goals can now be found in the care plan section) Acute Rehab PT Goals Patient Stated Goal: go home today PT Goal Formulation: With patient Time For Goal Achievement: 07/23/19 Potential to Achieve Goals: Good Progress towards PT goals: Progressing toward goals    Frequency    Min 5X/week  PT Plan Current plan remains appropriate    Co-evaluation              AM-PAC PT "6 Clicks" Mobility   Outcome Measure  Help needed turning from your back to your side while in a flat bed without using bedrails?: None Help needed moving from lying on your back to sitting on the side of a flat bed without using bedrails?: None Help needed moving to and from a bed to a chair (including a wheelchair)?: None Help needed standing up from a chair using your arms (e.g., wheelchair or bedside chair)?: None Help needed to walk in hospital room?: None Help needed climbing 3-5 steps with a railing? : None 6 Click Score: 24    End of Session Equipment Utilized During Treatment: Back brace Activity Tolerance:  Patient tolerated treatment well Patient left: in bed;with call bell/phone within reach;Other (comment)(seated EOB) Nurse Communication: Mobility status PT Visit Diagnosis: Difficulty in walking, not elsewhere classified (R26.2)     Time: [id number] PT Time Calculation (min) (ACUTE ONLY): 11 min  Charges:  $Gait Training: 8-22 mins                     Anastasio Champion, DPT  Acute Rehabilitation Services Pager [phone] Office [place] 07/11/2019, 10:10 AM

## 2019-07-11 NOTE — Discharge Instructions (Signed)

## 2019-07-11 NOTE — Progress Notes (Signed)
Occupational Therapy Treatment and Discharge Patient Details Name: Karen Nixon MRN: [id number] DOB: 06-13-1970 Today's Date: 07/11/2019    History of present illness Patient is a 49 y/o female who presents s/p L3-S1 ALIF and Posterior percutaneous pedicle screw fixation L3-S1 5/10. PMH includes HLD, glaucoma, DM, thyroid disease.   OT comments  Pt seen for OT follow up with focus on d/c home. Pt completed LB dressing with own clothes using figure four method at mod I level. She also demonstrated safe transfers to toilet and bed at mod I level. Educated pt on need for shower seat at home to maintain safe precautions while bathing. Pt is able to name 3/3 precautions this session. She states her pain is much improved, and she feels much more able to handle herself at home alone. Educated pt on setting alarm at night for pain medicines and lumbar safe sleeping positions. No further OT needs are identified at this time, OT will sign off. Thank you for this consult.    Follow Up Recommendations  No OT follow up    Equipment Recommendations  3 in 1 bedside commode    Recommendations for Other Services      Precautions / Restrictions Precautions Precautions: Back Precaution Booklet Issued: No Precaution Comments: Reviewed precautions and handout Required Braces or Orthoses: Spinal Brace Spinal Brace: Lumbar corset;Applied in sitting position Restrictions Weight Bearing Restrictions: No       Mobility Bed Mobility               General bed mobility comments: up in chair  Transfers Overall transfer level: Modified independent                    Balance Overall balance assessment: Mild deficits observed, not formally tested                                         ADL either performed or assessed with clinical judgement   ADL Overall ADL's : Modified independent                                       General ADL Comments: Pt  demonstrated ability to complete BADL at mod I level this date. Pain is much better controlled than from previous sessions. Pt completed figure 4 method for LB Dressing to dress self in own clothes without external assist.     Vision Patient Visual Report: No change from baseline     Perception     Praxis      Cognition Arousal/Alertness: Awake/alert Behavior During Therapy: WFL for tasks assessed/performed Overall Cognitive Status: Within Functional Limits for tasks assessed                                          Exercises     Shoulder Instructions       General Comments      Pertinent Vitals/ Pain       Pain Assessment: Faces Faces Pain Scale: Hurts a little bit Pain Location: back incision site Pain Descriptors / Indicators: Aching Pain Intervention(s): Monitored during session;Repositioned  Home Living  Prior Functioning/Environment              Frequency           Progress Toward Goals  OT Goals(current goals can now be found in the care plan section)  Progress towards OT goals: Goals met/education completed, patient discharged from OT  Acute Rehab OT Goals Patient Stated Goal: go home today OT Goal Formulation: All assessment and education complete, DC therapy  Plan Discharge plan needs to be updated;All goals met and education completed, patient discharged from OT services    Co-evaluation                 AM-PAC OT "6 Clicks" Daily Activity     Outcome Measure   Help from another person eating meals?: None Help from another person taking care of personal grooming?: None Help from another person toileting, which includes using toliet, bedpan, or urinal?: None Help from another person bathing (including washing, rinsing, drying)?: None Help from another person to put on and taking off regular upper body clothing?: None Help from another person to put on and  taking off regular lower body clothing?: None 6 Click Score: 24    End of Session Equipment Utilized During Treatment: Rolling walker;Back brace  OT Visit Diagnosis: Other abnormalities of gait and mobility (R26.89);Unsteadiness on feet (R26.81);Pain Pain - part of body: (back)   Activity Tolerance Patient tolerated treatment well   Patient Left in chair;with call bell/phone within reach   Nurse Communication Mobility status        Time: [id number] OT Time Calculation (min): 9 min  Charges: OT General Charges $OT Visit: 1 Visit OT Treatments $Self Care/Home Management : 8-22 mins  Zenovia Jarred, MSOT, OTR/L [place] [facility] Office Number: [phone] Pager: [place] 07/11/2019, 9:11 AM

## 2019-07-11 NOTE — Plan of Care (Signed)
Pt doing well. Pt given D/C instructions with verbal understanding. Rx's were sent to pharmacy by MD. Pt's incisions are clean and dry with no sign of infection. Pt's IV was removed prior to D/C. Pt D/C'd home via wheelchair per MD order. Pt is stable @ D/C and has no other needs at this time. Holli Humbles, RN

## 2019-07-12 NOTE — Discharge Summary (Signed)
Physician Discharge Summary  Patient ID: Karen Nixon MRN: [id number] DOB/AGE: 51-23-1972 51 y.o.  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  Radiculopathy.  Foot drop.  Diabetes mellitus. Active Problems:   Lumbar radiculopathy, chronic   Discharged Condition: good  Hospital Course: Patient was admitted to undergo surgical decompression via an anterior lumbar interbody arthrodesis at L3-4 L4-5 L5-S1.  She also underwent posterior fixation L3 to the sacrum.  This was done with robotic assistance.  She tolerated both surgeries well.  Postoperatively her blood sugars required some modest difficulty to control but she was ambulating and by the third postoperative day she was doing well she is discharged home at this time.  Consults: vascular surgery  Significant Diagnostic Studies: None  Treatments: surgery: Anterior interbody decompression arthrodesis L3-4 L4-5 L5-S1.  Posterior segmental fixation L3 to the sacrum using robotic assistance in placing pedicle screws.  Discharge Exam: Blood pressure 114/78, pulse 85, temperature 98.2 F (36.8 C), temperature source Oral, resp. rate 18, height 5\' 5"  (1.651 m), weight 85.7 kg, SpO2 96 %. Incision is clean and dry motor function is intact in the lower extremities.  Disposition: Discharge disposition: 01-Home or Self Care       Discharge Instructions    Call MD for:  redness, tenderness, or signs of infection (pain, swelling, redness, odor or green/yellow discharge around incision site)   Complete by: As directed    Call MD for:  severe uncontrolled pain   Complete by: As directed    Call MD for:  temperature >100.4   Complete by: As directed    Diet - low sodium heart healthy   Complete by: As directed    Discharge instructions   Complete by: As  directed    Okay to shower. Do not apply salves or appointments to incision. No heavy lifting with the upper extremities greater than 15 pounds. May resume driving when not requiring pain medication and patient feels comfortable with doing so.   Incentive spirometry RT   Complete by: As directed    Increase activity slowly   Complete by: As directed      Allergies as of 07/11/2019      Reactions   Lac Bovis Other (See Comments), Diarrhea, Nausea And Vomiting, Palpitations   Iodinated Diagnostic Agents Other (See Comments), Hives    Code: HIVES, Desc: Pt developed hives on neck, face and back with itching. Given 50mg  oral benadryl., Onset Date: [id number]   Naproxen Sodium Hives   swelling per pt   Iohexol     Code: HIVES, Desc: Pt developed hives on neck, face and back with itching. Given 50mg  oral benadryl., Onset Date: [id number]   Lactose Intolerance (gi) Diarrhea, Nausea And Vomiting, Palpitations      Medication List    TAKE these medications   ALAWAY OP Place 1 drop into both eyes daily as needed (allergies).   APPLE CIDER VINEGAR PO Take 1 capsule by mouth daily.   azelastine 0.1 % nasal spray Commonly known as: ASTELIN Place 2 sprays into both nostrils 2 (two) times daily. Use in each nostril as directed   Calcium Antacid Ultra 400 MG chewable tablet Generic drug: calcium elemental as carbonate Chew 3,000 mg by mouth daily as needed for heartburn.   cetirizine 10 MG tablet Commonly known as: ZYRTEC Take 10 mg by mouth daily as needed for allergies.  EPINEPHrine 0.3 mg/0.3 mL Soaj injection Commonly known as: EPI-PEN Inject 0.3 mg into the muscle as needed for anaphylaxis.   ergocalciferol 1.25 MG (50000 UT) capsule Commonly known as: VITAMIN D2 Take 50,000 Units by mouth every Sunday.   gabapentin 300 MG capsule Commonly known as: NEURONTIN Take 1 capsule (300 mg total) by mouth 3 (three) times daily.   ibuprofen 600 MG tablet Commonly known as: ADVIL Take  600 mg by mouth every 6 (six) hours as needed for moderate pain.   latanoprost 0.005 % ophthalmic solution Commonly known as: XALATAN Place 1 drop into both eyes at bedtime.   levothyroxine 100 MCG tablet Commonly known as: SYNTHROID Take 1 tablet (100 mcg total) by mouth daily before breakfast.   lisinopril 5 MG tablet Commonly known as: ZESTRIL Take 5 mg by mouth daily.   metFORMIN 500 MG 24 hr tablet Commonly known as: GLUCOPHAGE-XR Take 500 mg by mouth in the morning and at bedtime.   multivitamin with minerals Tabs tablet Take 2 tablets by mouth daily.   OVER THE COUNTER MEDICATION Take 1 tablet by mouth daily as needed (gas). Equate Gas and Bloating prevention   oxyCODONE 5 MG immediate release tablet Commonly known as: Oxy IR/ROXICODONE Take 1-2 tablets (5-10 mg total) by mouth every 4 (four) hours as needed for moderate pain or severe pain.   rosuvastatin 20 MG tablet Commonly known as: CRESTOR Take 20 mg by mouth daily.   Simethicone 180 MG Caps Take 2 capsules by mouth as needed.   tiZANidine 2 MG tablet Commonly known as: ZANAFLEX Take 2 tablets (4 mg total) by mouth every 6 (six) hours as needed for muscle spasms. What changed:   how much to take  when to take this      Follow-up Information    [name], MD Follow up in 3 week(s).   Specialty: Neurosurgery Contact information: [address] [place] [address] [phone]           Signed: Earleen Newport 07/12/2019, 4:53 PM

## 2019-08-14 ENCOUNTER — Emergency Department (EMERGENCY_DEPARTMENT_HOSPITAL)
Admission: EM | Admit: 2019-08-14 | Discharge: 2019-08-14 | Disposition: A | Discharge status: Home / Self Care | Payer: Medicaid Other | Source: Home / Self Care | Attending: Emergency Medicine | Admitting: Emergency Medicine

## 2019-08-14 ENCOUNTER — Emergency Department (HOSPITAL_COMMUNITY): Payer: Medicaid Other

## 2019-08-14 ENCOUNTER — Emergency Department (HOSPITAL_COMMUNITY)
Admission: EM | Admit: 2019-08-14 | Discharge: 2019-08-14 | Disposition: A | Discharge status: Home / Self Care | Payer: Medicaid Other | Attending: Emergency Medicine | Admitting: Emergency Medicine

## 2019-08-14 ENCOUNTER — Other Ambulatory Visit: Payer: Self-pay

## 2019-08-14 ENCOUNTER — Encounter (HOSPITAL_COMMUNITY): Payer: Self-pay | Admitting: Emergency Medicine

## 2019-08-14 DIAGNOSIS — R202 Paresthesia of skin: Secondary | ICD-10-CM | POA: Diagnosis not present

## 2019-08-14 DIAGNOSIS — Z79899 Other long term (current) drug therapy: Secondary | ICD-10-CM | POA: Diagnosis not present

## 2019-08-14 DIAGNOSIS — E1122 Type 2 diabetes mellitus with diabetic chronic kidney disease: Secondary | ICD-10-CM | POA: Insufficient documentation

## 2019-08-14 DIAGNOSIS — I129 Hypertensive chronic kidney disease with stage 1 through stage 4 chronic kidney disease, or unspecified chronic kidney disease: Secondary | ICD-10-CM | POA: Insufficient documentation

## 2019-08-14 DIAGNOSIS — M79671 Pain in right foot: Secondary | ICD-10-CM | POA: Insufficient documentation

## 2019-08-14 DIAGNOSIS — Z87891 Personal history of nicotine dependence: Secondary | ICD-10-CM | POA: Diagnosis not present

## 2019-08-14 DIAGNOSIS — N183 Chronic kidney disease, stage 3 unspecified: Secondary | ICD-10-CM | POA: Diagnosis not present

## 2019-08-14 DIAGNOSIS — Z7984 Long term (current) use of oral hypoglycemic drugs: Secondary | ICD-10-CM | POA: Insufficient documentation

## 2019-08-14 DIAGNOSIS — R200 Anesthesia of skin: Secondary | ICD-10-CM | POA: Insufficient documentation

## 2019-08-14 LAB — PROTIME-INR
INR: 1 (ref 0.8–1.2)
Prothrombin Time: 12.7 seconds (ref 11.4–15.2)

## 2019-08-14 LAB — COMPREHENSIVE METABOLIC PANEL
ALT: 15 U/L (ref 0–44)
AST: 18 U/L (ref 15–41)
Albumin: 4.1 g/dL (ref 3.5–5.0)
Alkaline Phosphatase: 98 U/L (ref 38–126)
Anion gap: 9 (ref 5–15)
BUN: 18 mg/dL (ref 6–20)
CO2: 21 mmol/L — ABNORMAL LOW (ref 22–32)
Calcium: 9.4 mg/dL (ref 8.9–10.3)
Chloride: 112 mmol/L — ABNORMAL HIGH (ref 98–111)
Creatinine, Ser: 0.93 mg/dL (ref 0.44–1.00)
GFR calc Af Amer: >60 mL/min (ref >60)
GFR calc non Af Amer: >60 mL/min (ref >60)
Glucose, Bld: 88 mg/dL (ref 70–99)
Potassium: 3.7 mmol/L (ref 3.5–5.1)
Sodium: 142 mmol/L (ref 135–145)
Total Bilirubin: 0.7 mg/dL (ref 0.3–1.2)
Total Protein: 7.2 g/dL (ref 6.5–8.1)

## 2019-08-14 LAB — CBC
HCT: 36.1 % (ref 36.0–46.0)
Hemoglobin: 10.7 g/dL — ABNORMAL LOW (ref 12.0–15.0)
MCH: 27.6 pg (ref 26.0–34.0)
MCHC: 29.6 g/dL — ABNORMAL LOW (ref 30.0–36.0)
MCV: 93.3 fL (ref 80.0–100.0)
Platelets: 253 10*3/uL (ref 150–400)
RBC: 3.87 MIL/uL (ref 3.87–5.11)
RDW: 14.2 % (ref 11.5–15.5)
WBC: 5.9 10*3/uL (ref 4.0–10.5)
nRBC: 0 % (ref 0.0–0.2)

## 2019-08-14 LAB — DIFFERENTIAL
Abs Immature Granulocytes: 0.01 10*3/uL (ref 0.00–0.07)
Basophils Absolute: 0.1 10*3/uL (ref 0.0–0.1)
Basophils Relative: 1 %
Eosinophils Absolute: 0.1 10*3/uL (ref 0.0–0.5)
Eosinophils Relative: 2 %
Immature Granulocytes: 0 %
Lymphocytes Relative: 50 %
Lymphs Abs: 3 10*3/uL (ref 0.7–4.0)
Monocytes Absolute: 0.4 10*3/uL (ref 0.1–1.0)
Monocytes Relative: 6 %
Neutro Abs: 2.4 10*3/uL (ref 1.7–7.7)
Neutrophils Relative %: 41 %

## 2019-08-14 LAB — I-STAT CHEM 8, ED
BUN: 19 mg/dL (ref 6–20)
Calcium, Ion: 1.13 mmol/L — ABNORMAL LOW (ref 1.15–1.40)
Chloride: 108 mmol/L (ref 98–111)
Creatinine, Ser: 0.9 mg/dL (ref 0.44–1.00)
Glucose, Bld: 86 mg/dL (ref 70–99)
HCT: 33 % — ABNORMAL LOW (ref 36.0–46.0)
Hemoglobin: 11.2 g/dL — ABNORMAL LOW (ref 12.0–15.0)
Potassium: 3.7 mmol/L (ref 3.5–5.1)
Sodium: 143 mmol/L (ref 135–145)
TCO2: 22 mmol/L (ref 22–32)

## 2019-08-14 LAB — RAPID URINE DRUG SCREEN, HOSP PERFORMED
Amphetamines: NOT DETECTED
Barbiturates: NOT DETECTED
Benzodiazepines: NOT DETECTED
Cocaine: NOT DETECTED
Opiates: NOT DETECTED
Tetrahydrocannabinol: NOT DETECTED

## 2019-08-14 LAB — URINALYSIS, ROUTINE W REFLEX MICROSCOPIC
Bacteria, UA: NONE SEEN
Bilirubin Urine: NEGATIVE
Glucose, UA: NEGATIVE mg/dL
Hgb urine dipstick: NEGATIVE
Ketones, ur: NEGATIVE mg/dL
Nitrite: NEGATIVE
Protein, ur: NEGATIVE mg/dL
Specific Gravity, Urine: 1.013 (ref 1.005–1.030)
pH: 6 (ref 5.0–8.0)

## 2019-08-14 LAB — ETHANOL: Alcohol, Ethyl (B): <10 mg/dL (ref <10)

## 2019-08-14 LAB — I-STAT BETA HCG BLOOD, ED (MC, WL, AP ONLY): I-stat hCG, quantitative: <5 m[IU]/mL (ref <5)

## 2019-08-14 LAB — CBG MONITORING, ED: Glucose-Capillary: 80 mg/dL (ref 70–99)

## 2019-08-14 LAB — APTT: aPTT: 29 seconds (ref 24–36)

## 2019-08-14 NOTE — Code Documentation (Addendum)
MRI reviewed by Rory Percy. Code stroke cancelled. Handoff given to Raquel Sarna, Therapist, sports.

## 2019-08-14 NOTE — ED Provider Notes (Signed)
[facility] Provider Note   CSN: [id number] Arrival date & time: 08/14/19  1543  An emergency department physician performed an initial assessment on this suspected stroke patient at 1547.  History Chief Complaint  Patient presents with  . Code Stroke    LAIKEN SANDY is a 49 y.o. female.  HPI Patient presents for evaluation of a numb sensation of the left side of her face which she noticed today after taking a pain pill, oxycodone.  She called EMS who transferred her here, designated her as a code stroke.  On arrival she was initially seen and managed by the stroke team.  Patient has secondary complaint of a burning pain of the top of her right foot present for several weeks, following recent lumbar surgery.  She saw her neurosurgeon, in follow-up, 2 days ago performed plain x-rays and deemed her stable for discharge without intervention.  The patient had persistent right foot pain so this morning called her neurosurgeon, who consented to call in a prescription for oxycodone.  When he saw her 2 days ago he took her off oxycodone, told her to use Tylenol for pain, and increased her gabapentin to 600 in the morning and evening, and 300 at lunchtime.  Patient denies fever, chills, cough, shortness of breath, focal weakness or dizziness.  There are no other known modifying factors.    Past Medical History:  Diagnosis Date  . Anxiety   . DDD (degenerative disc disease), lumbar   . Degenerative disc disease, lumbar   . Depression    due to chronic pain  . Diabetes mellitus without complication ([place])   . Dislocation of shoulder    x 9  . GERD (gastroesophageal reflux disease)   . Glaucoma   . Hyperlipemia   . Hypothyroidism   . Left facial numbness 2010   and tingling  and left arm- years ago.  Dr says it was stress  . Scoliosis   . Shortness of breath dyspnea    with exertation  . Thyroid disease   . Umbilical hernia     Patient Active  Problem List   Diagnosis Date Noted  . Lumbar radiculopathy, chronic 07/08/2019  . Degenerative disc disease at L5-S1 level 03/25/2019  . Ventral incisional hernia 03/12/2019  . Type 2 diabetes mellitus with hyperglycemia ([place]) 03/07/2019  . Chronic kidney disease (CKD), stage III (moderate) 02/14/2019  . Mixed hyperlipidemia 02/11/2019  . MDD (major depressive disorder) 02/11/2019  . Foot drop, left foot 12/15/2016  . Syncope 04/18/2016  . Chronic pain 04/18/2016  . 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 BREAST MASS, LEFT 09/26/2007  . RHINITIS, ALLERGIC NOS 10/26/2006  . Graves' disease 10/24/2006  . Hypothyroidism 10/24/2006  . Hypertension 10/24/2006  . HX, PERSONAL, CERVICAL DYSPLASIA 10/24/2006    Past Surgical History:  Procedure Laterality Date  . ABDOMINAL EXPOSURE N/A 07/08/2019   Procedure: ABDOMINAL EXPOSURE;  Surgeon: Rosetta Posner, MD;  Location: [facility];  Service: Vascular;  Laterality: N/A;  . ANTERIOR LUMBAR FUSION N/A 07/08/2019   Procedure: Lumbar Three-Four Lumbar Four-Five Lumbar Five Sacral One Anterior lumbar interbody fusion;  Surgeon: [name], MD;  Location: [facility];  Service: Neurosurgery;  Laterality: N/A;  Lumbar Three-Four Lumbar Four-Five Lumbar Five Sacral One Anterior lumbar interbody fusion  . APPLICATION OF ROBOTIC ASSISTANCE FOR SPINAL PROCEDURE N/A 07/08/2019   Procedure: APPLICATION OF ROBOTIC ASSISTANCE FOR SPINAL PROCEDURE;  Surgeon: [name], MD;  Location: [facility];  Service: Neurosurgery;  Laterality: N/A;  . BACK SURGERY  2005  . CERVICAL CONIZATION W/BX    . ELBOW SURGERY Right 2007   fracture repair with screws  . INDUCED ABORTION    . LUMBAR PERCUTANEOUS PEDICLE SCREW 3 LEVEL N/A 07/08/2019   Procedure: Posterior percutaneous pedicle screw fixation Lumbar Three to Sacral One;  Surgeon: [name], MD;  Location: [facility];  Service: Neurosurgery;  Laterality: N/A;  Posterior percutaneous pedicle screw fixation Lumbar Three  to Sacral One  . SHOULDER ARTHROSCOPY Right 09/09/2014   Procedure: RIGHT SHOULDER ARTHROSCOPY WITH LABRAL REPAIR;  Surgeon: Mcarthur Rossetti, MD;  Location: [facility];  Service: Orthopedics;  Laterality: Right;  . SHOULDER SURGERY Right 2001   arthroscopy  . TUBAL LIGATION  11/20/1996     OB History    Gravida  3   Para  2   Term  2   Preterm      AB  1   Living  2     SAB      TAB      Ectopic      Multiple      Live Births              Family History  Problem Relation Age of Onset  . Cancer Mother   . Hypertension Brother     Social History   Tobacco Use  . Smoking status: Former Smoker    Types: Cigarettes    Quit date: 11/22/2018    Years since quitting: 0.7  . Smokeless tobacco: Never Used  Vaping Use  . Vaping Use: Never used  Substance Use Topics  . Alcohol use: Not Currently    Comment: occasionally a couple of beers  . Drug use: No    Home Medications Prior to Admission medications   Medication Sig Start Date End Date Taking? Authorizing Provider  APPLE CIDER VINEGAR PO Take 1 capsule by mouth daily.    [provider]  azelastine (ASTELIN) 0.1 % nasal spray Place 2 sprays into both nostrils 2 (two) times daily. Use in each nostril as directed 06/28/19   Kennith Gain, MD  calcium elemental as carbonate (CALCIUM ANTACID ULTRA) 400 MG chewable tablet Chew 3,000 mg by mouth daily as needed for heartburn.    [provider]  cetirizine (ZYRTEC) 10 MG tablet Take 10 mg by mouth daily as needed for allergies.    [provider]  EPINEPHrine 0.3 mg/0.3 mL IJ SOAJ injection Inject 0.3 mg into the muscle as needed for anaphylaxis.    [provider]  ergocalciferol (VITAMIN D2) 1.25 MG (50000 UT) capsule Take 50,000 Units by mouth every Sunday.  05/24/19 08/24/19  [provider]  gabapentin (NEURONTIN) 300 MG capsule Take 1 capsule (300 mg total) by mouth 3 (three) times daily. 01/22/19    Raylene Everts, MD  ibuprofen (ADVIL) 600 MG tablet Take 600 mg by mouth every 6 (six) hours as needed for moderate pain.    [provider]  Ketotifen Fumarate (ALAWAY OP) Place 1 drop into both eyes daily as needed (allergies).    [provider]  latanoprost (XALATAN) 0.005 % ophthalmic solution Place 1 drop into both eyes at bedtime.  04/04/19   [provider]  levothyroxine (SYNTHROID) 100 MCG tablet Take 1 tablet (100 mcg total) by mouth daily before breakfast. 01/22/19   Raylene Everts, MD  lisinopril (ZESTRIL) 5 MG tablet Take 5 mg by mouth daily.    [provider]  metFORMIN (GLUCOPHAGE-XR) 500 MG 24 hr tablet Take 500 mg by mouth in the morning and at bedtime.    [provider]  Multiple Vitamin (MULTIVITAMIN WITH MINERALS) TABS tablet Take 2 tablets by mouth daily.    [provider]  OVER THE COUNTER MEDICATION Take 1 tablet by mouth daily as needed (gas). Equate Gas and Bloating prevention    [provider]  oxyCODONE (OXY IR/ROXICODONE) 5 MG immediate release tablet Take 1-2 tablets (5-10 mg total) by mouth every 4 (four) hours as needed for moderate pain or severe pain. 07/11/19   [name], MD  rosuvastatin (CRESTOR) 20 MG tablet Take 20 mg by mouth daily.    [provider]  Simethicone 180 MG CAPS Take 2 capsules by mouth as needed.    [provider]  tiZANidine (ZANAFLEX) 2 MG tablet Take 2 tablets (4 mg total) by mouth every 6 (six) hours as needed for muscle spasms. 07/11/19   [name], MD  lovastatin (MEVACOR) 20 MG tablet Take 20 mg by mouth at bedtime.  01/22/19  [provider]  traZODone (DESYREL) 50 MG tablet Take 50 mg by mouth at bedtime.   01/22/19  [provider]    Allergies    Lac bovis, Iodinated diagnostic agents, Naproxen sodium, Iohexol, and Lactose intolerance (gi)  Review of Systems   Review of Systems  All other systems reviewed and are  negative.   Physical Exam Updated Vital Signs BP (!) 168/106   Pulse 70   Temp 98.3 F (36.8 C)   Resp 16   SpO2 99%   Physical Exam Vitals and nursing note reviewed.  Constitutional:      General: She is not in acute distress.    Appearance: She is well-developed. She is not ill-appearing, toxic-appearing or diaphoretic.  HENT:     Head: Normocephalic and atraumatic.  Eyes:     Conjunctiva/sclera: Conjunctivae normal.     Pupils: Pupils are equal, round, and reactive to light.  Neck:     Trachea: Phonation normal.  Cardiovascular:     Rate and Rhythm: Normal rate and regular rhythm.  Pulmonary:     Effort: Pulmonary effort is normal.     Breath sounds: Normal breath sounds.  Chest:     Chest wall: No tenderness.  Abdominal:     General: There is no distension.     Palpations: Abdomen is soft.     Tenderness: There is no abdominal tenderness. There is no guarding.  Musculoskeletal:        General: Normal range of motion.     Cervical back: Normal range of motion and neck supple.     Comments: Normal strength arms and legs bilaterally.  Skin:    General: Skin is warm and dry.  Neurological:     Mental Status: She is alert and oriented to person, place, and time.     Motor: No abnormal muscle tone.     Comments: No dysarthria or aphasia.  No nystagmus.  Mild light touch dysphagia of the mid left face, no other cranial nerve abnormality.  Hyperesthetic to light touch on dorsum of right foot.  Psychiatric:        Mood and Affect: Mood normal.        Behavior: Behavior normal.        Thought Content: Thought content normal.        Judgment: Judgment normal.     ED Results / Procedures / Treatments  Labs (all labs ordered are listed, but only abnormal results are displayed) Labs Reviewed  CBC - Abnormal; Notable for the following components:      Result Value   Hemoglobin 10.7 (*)    MCHC 29.6 (*)    All other components within normal limits  COMPREHENSIVE  METABOLIC PANEL - Abnormal; Notable for the following components:   Chloride 112 (*)    CO2 21 (*)    All other components within normal limits  I-STAT CHEM 8, ED - Abnormal; Notable for the following components:   Calcium, Ion 1.13 (*)    Hemoglobin 11.2 (*)    HCT 33.0 (*)    All other components within normal limits  ETHANOL  PROTIME-INR  APTT  DIFFERENTIAL  RAPID URINE DRUG SCREEN, HOSP PERFORMED  URINALYSIS, ROUTINE W REFLEX MICROSCOPIC  CBG MONITORING, ED  I-STAT BETA HCG BLOOD, ED ([facility], [facility], [facility] ONLY)    EKG EKG Interpretation  Date/Time:  Wednesday August 14 2019 16:49:47 EDT Ventricular Rate:  66 PR Interval:    QRS Duration: 87 QT Interval:  436 QTC Calculation: 457 R Axis:   76 Text Interpretation: Sinus rhythm Borderline T abnormalities, anterior leads since last tracing no significant change Confirmed by Daleen Bo [phone]) on 08/14/2019 4:55:57 PM   Radiology MR BRAIN WO CONTRAST  Result Date: 08/14/2019 CLINICAL DATA:  Acute right arm and face numbness.  Left leg ataxia. EXAM: MRI HEAD WITHOUT CONTRAST TECHNIQUE: Multiplanar, multiecho pulse sequences of the brain and surrounding structures were obtained without intravenous contrast. COMPARISON:  Head CT 08/14/2019 FINDINGS: Brain: There is no evidence of acute infarct, intracranial hemorrhage, mass, midline shift, or extra-axial fluid collection. The ventricles and sulci are normal. Small foci of T2 hyperintensity in the cerebral white matter bilaterally are nonspecific but compatible with mild chronic small vessel ischemic disease. There is a mildly expanded partially empty sella. Vascular: Major intracranial vascular flow voids are preserved. Skull and upper cervical spine: Unremarkable bone marrow signal. Sinuses/Orbits: Unremarkable orbits. Paranasal sinuses and mastoid air cells are clear. Other: None. IMPRESSION: 1. No acute intracranial abnormality. 2. Mild chronic small vessel ischemic disease. Electronically  Signed   By: Logan Bores M.D.   On: 08/14/2019 16:40   DG Foot Complete Right  Result Date: 08/14/2019 CLINICAL DATA:  Pain EXAM: RIGHT FOOT COMPLETE - 3+ VIEW COMPARISON:  None. FINDINGS: There is no evidence of fracture or dislocation. There is no evidence of arthropathy or other focal bone abnormality. Tiny calcaneal enthesophyte seen at the Achilles tendon insertion site. Soft tissues are unremarkable. IMPRESSION: No acute osseous abnormality. Electronically Signed   By: Prudencio Pair M.D.   On: 08/14/2019 04:00   CT HEAD CODE STROKE WO CONTRAST  Result Date: 08/14/2019 CLINICAL DATA:  Code stroke.  Left facial droop and arm weakness. EXAM: CT HEAD WITHOUT CONTRAST TECHNIQUE: Contiguous axial images were obtained from the base of the skull through the vertex without intravenous contrast. COMPARISON:  CT head 04/19/2016 FINDINGS: Brain: No evidence of acute infarction, hemorrhage, hydrocephalus, extra-axial collection or mass lesion/mass effect. Small hypodensity in the inferior right frontal white matter is unchanged from the prior study and likely is chronic ischemia. No other ischemic change. Vascular: Negative for hyperdense vessel Skull: Negative Sinuses/Orbits: Mild mucosal edema paranasal sinuses. Negative orbit Other: None ASPECTS ([place] Stroke Program Early CT Score) - Ganglionic level infarction (caudate, lentiform nuclei, internal capsule, insula, M1-M3 cortex): 7 - Supraganglionic infarction (M4-M6 cortex): 3 Total score (0-10 with 10 being normal): 10  IMPRESSION: 1. No acute abnormality 2. ASPECTS is 10 3. Small hypodensity right frontal white matter unchanged from 2018 most likely chronic ischemia. 4. Preliminary results texted to Dr. Rory Percy Electronically Signed   By: Franchot Gallo M.D.   On: 08/14/2019 16:00    Procedures Procedures (including critical care time)  Medications Ordered in ED Medications - No data to display  ED Course  I have reviewed the triage vital signs and  the nursing notes.  Pertinent labs & imaging results that were available during my care of the patient were reviewed by me and considered in my medical decision making (see chart for details).  Clinical Course as of Aug 13 1741  Wed Aug 14, 2019  1657 I discussed the case with the neuro hospitalist, Dr. Rory Percy, who has seen the patient and managed her care, initially.  He does not think that she has had a stroke, note that the MRI of the brain is negative.  Dr. Rory Percy does not plan on any additional neurology evaluation or interventions.   [EW]  1658 Normal except calcium low, hemoglobin low  I-stat chem 8, ED(!) [EW]  1658 Normal  Ethanol [EW]  1658 Normal except hemoglobin low   [EW]  1658 Normal except chloride high, CO2 low  Comprehensive metabolic panel(!) [EW]  5009 Per radiologist no acute abnormality.  CT HEAD CODE STROKE WO CONTRAST [EW]  3818 Per radiologist, no acute abnormality.  MR BRAIN WO CONTRAST [EW]    Clinical Course User Index [EW] Daleen Bo, MD   MDM Rules/Calculators/A&P                           Patient Vitals for the past 24 hrs:  BP Temp Pulse Resp SpO2  08/14/19 1607 (!) 168/106 98.3 F (36.8 C) 70 16 99 %    5:43 PM Reevaluation with update and discussion. After initial assessment and treatment, an updated evaluation reveals no change in clinical status, findings discussed with the patient and all questions were answered. Daleen Bo   Medical Decision Making:  This patient is presenting for evaluation of left facial numbness, which does require a range of treatment options, and is a complaint that involves a high risk of morbidity and mortality. The differential diagnoses include stroke, nerve impingement, migraine. I decided to review old records, and in summary patient with recent lumbar surgery and residual right foot pain.  New problem today left facial numbness without other worrisome findings.  I did not require additional historical  information from anyone.  Clinical Laboratory Tests Ordered, included CBC and Metabolic panel. Review indicates normal. Radiologic Tests Ordered, included CT head, MRI brain.  I independently Visualized: Radiologic images, which show no acute CVA  Cardiac Monitor Tracing which shows normal sinus rhythm    Critical Interventions-clinical evaluation, laboratory testing, radiologic imaging, observation reassessment   After These Interventions, the Patient was reevaluated and was found presenting with isolated neurologic abnormality left facial numbness.  Comprehensive evaluation is reassuring.  No evidence for stroke, concern for migraine, oral dental abnormality, or metabolic instability.   CRITICAL CARE-no Performed by: Daleen Bo  Nursing Notes Reviewed/ Care Coordinated Applicable Imaging Reviewed Interpretation of Laboratory Data incorporated into ED treatment  The patient appears reasonably screened and/or stabilized for discharge and I doubt any other medical condition or other [facility] requiring further screening, evaluation, or treatment in the ED at this time prior to discharge.  Plan: Home Medications-continue usual; Home Treatments-gradual advance  diet and activity; return here if the recommended treatment, does not improve the symptoms; Recommended follow up-PCP as needed.  Neurosurgery as scheduled.     Final Clinical Impression(s) / ED Diagnoses Final diagnoses:  Numbness  Right foot pain    Rx / DC Orders ED Discharge Orders    None       Daleen Bo, MD 08/14/19 1747

## 2019-08-14 NOTE — ED Triage Notes (Signed)
Patient reports worsening right foot pain for several days unrelieved by prescription/OTC pain medications , denies injury/ambulatory .

## 2019-08-14 NOTE — ED Notes (Signed)
Pt stated that they were leaving  

## 2019-08-14 NOTE — ED Triage Notes (Signed)
Pt BIB EMS for code stroke, LSW 1430. Pt was in bed and got up and her R face tightened and arm "dullness" R side. Code stroke called by EMS, seen earlier for foot pain

## 2019-08-14 NOTE — Discharge Instructions (Addendum)
Testing today does not show any sign of stroke or acute neurologic abnormality.  Continue following the directions and advice of Dr. Ellene Route regarding your right foot pain.  Follow-up with your primary care doctor as needed for problems.

## 2019-08-14 NOTE — Consult Note (Signed)
Neurology Consultation  Reason for Consult: code stroke Referring Physician: Dr. Eulis Foster  CC: Left facial numbness and Left sided weakness  History is obtained from: patient  HPI: Karen Nixon is a 49 y.o. female with PMHx of hyerlipidemia, left facial numbness 2010, depression, DDD. She was in ED last night due to left foot pain but left due to prolonged wait.  Patient states she got up to to take her medications, when she sudden onset of left facial decreased sensation and weakness on left side. EMS was call . Patient was was brought to ED where she continued to have symptoms. CT was negative thus patient was brought to MRI.  No history of migraines.  No history of headache today.   LKW: 6010 today tpa given?: no, inconsistent exam Premorbid modified Rankin scale (mRS): 0 NIHSS: 1-for sensory   ROS: Performed and negative except as noted in HPI  Past Medical History:  Diagnosis Date  . Anxiety   . DDD (degenerative disc disease), lumbar   . Degenerative disc disease, lumbar   . Depression    due to chronic pain  . Diabetes mellitus without complication ([place])   . Dislocation of shoulder    x 9  . GERD (gastroesophageal reflux disease)   . Glaucoma   . Hyperlipemia   . Hypothyroidism   . Left facial numbness 2010   and tingling  and left arm- years ago.  Dr says it was stress  . Scoliosis   . Shortness of breath dyspnea    with exertation  . Thyroid disease   . Umbilical hernia     Family History  Problem Relation Age of Onset  . Cancer Mother   . Hypertension Brother    Social History:   reports that she quit smoking about 8 months ago. Her smoking use included cigarettes. She has never used smokeless tobacco. She reports previous alcohol use. She reports that she does not use drugs.  Medications No current facility-administered medications for this encounter.  Current Outpatient Medications:  .  APPLE CIDER VINEGAR PO, Take 1 capsule by mouth daily., Disp: ,  Rfl:  .  azelastine (ASTELIN) 0.1 % nasal spray, Place 2 sprays into both nostrils 2 (two) times daily. Use in each nostril as directed, Disp: 30 mL, Rfl: 5 .  calcium elemental as carbonate (CALCIUM ANTACID ULTRA) 400 MG chewable tablet, Chew 3,000 mg by mouth daily as needed for heartburn., Disp: , Rfl:  .  cetirizine (ZYRTEC) 10 MG tablet, Take 10 mg by mouth daily as needed for allergies., Disp: , Rfl:  .  EPINEPHrine 0.3 mg/0.3 mL IJ SOAJ injection, Inject 0.3 mg into the muscle as needed for anaphylaxis., Disp: , Rfl:  .  ergocalciferol (VITAMIN D2) 1.25 MG (50000 UT) capsule, Take 50,000 Units by mouth every Sunday. , Disp: , Rfl:  .  gabapentin (NEURONTIN) 300 MG capsule, Take 1 capsule (300 mg total) by mouth 3 (three) times daily., Disp: 90 capsule, Rfl: 1 .  ibuprofen (ADVIL) 600 MG tablet, Take 600 mg by mouth every 6 (six) hours as needed for moderate pain., Disp: , Rfl:  .  Ketotifen Fumarate (ALAWAY OP), Place 1 drop into both eyes daily as needed (allergies)., Disp: , Rfl:  .  latanoprost (XALATAN) 0.005 % ophthalmic solution, Place 1 drop into both eyes at bedtime. , Disp: , Rfl:  .  levothyroxine (SYNTHROID) 100 MCG tablet, Take 1 tablet (100 mcg total) by mouth daily before breakfast., Disp: 30 tablet, Rfl:  1 .  lisinopril (ZESTRIL) 5 MG tablet, Take 5 mg by mouth daily., Disp: , Rfl:  .  metFORMIN (GLUCOPHAGE-XR) 500 MG 24 hr tablet, Take 500 mg by mouth in the morning and at bedtime., Disp: , Rfl:  .  Multiple Vitamin (MULTIVITAMIN WITH MINERALS) TABS tablet, Take 2 tablets by mouth daily., Disp: , Rfl:  .  OVER THE COUNTER MEDICATION, Take 1 tablet by mouth daily as needed (gas). Equate Gas and Bloating prevention, Disp: , Rfl:  .  oxyCODONE (OXY IR/ROXICODONE) 5 MG immediate release tablet, Take 1-2 tablets (5-10 mg total) by mouth every 4 (four) hours as needed for moderate pain or severe pain., Disp: 60 tablet, Rfl: 0 .  rosuvastatin (CRESTOR) 20 MG tablet, Take 20 mg by  mouth daily., Disp: , Rfl:  .  Simethicone 180 MG CAPS, Take 2 capsules by mouth as needed., Disp: , Rfl:  .  tiZANidine (ZANAFLEX) 2 MG tablet, Take 2 tablets (4 mg total) by mouth every 6 (six) hours as needed for muscle spasms., Disp: 60 tablet, Rfl: 0  Exam: Current vital signs: There were no vitals taken for this visit. Vital signs in last 24 hours: Temp:  [98.3 F (36.8 C)] 98.3 F (36.8 C) (06/16 0328) Pulse Rate:  [77] 77 (06/16 0328) Resp:  [16] 16 (06/16 0328) BP: (167)/(105) 167/105 (06/16 0328) SpO2:  [100 %] 100 % (06/16 0328) Weight:  [84.8 kg] 84.8 kg (06/16 0328) GENERAL: Awake, alert in NAD HEENT: - Normocephalic and atraumatic, dry mm, no Thyromegally LUNGS - Clear to auscultation bilaterally with no wheezes CV - S1S2 RRR, no m/r/g, equal pulses bilaterally. ABDOMEN - Soft, nontender, nondistended with normoactive BS Ext: warm, well perfused, intact peripheral pulses, no edema NEURO:  Mental Status: AA&Ox3  Language: speech is clear.  Naming, repetition, fluency, and comprehension intact. Cranial Nerves: PERRLA 2 mm/brisk. EOMI, visual fields full, no facial asymmetry, facial sensation intact, hearing intact, tongue/uvula/soft palate midline, normal sternocleidomastoid and trapezius muscle strength. No evidence of tongue atrophy or fibrillations Motor: 5/5 throughout with effort dependent left leg strength and positive hoover's. Drift bilateral legs Tone: is normal and bulk is normal Sensation- Intact to light touch bilaterally Coordination: FTN intact bilaterally, inconsistent left heel to shin ataxia Gait- deferred  Labs I have reviewed labs in epic and the results pertinent to this consultation are:  CBC    Component Value Date/Time   WBC 5.9 08/14/2019 1550   RBC 3.87 08/14/2019 1550   HGB 11.2 (L) 08/14/2019 1551   HCT 33.0 (L) 08/14/2019 1551   PLT 253 08/14/2019 1550   MCV 93.3 08/14/2019 1550   MCH 27.6 08/14/2019 1550   MCHC 29.6 (L) 08/14/2019  1550   RDW 14.2 08/14/2019 1550   LYMPHSABS 3.0 08/14/2019 1550   MONOABS 0.4 08/14/2019 1550   EOSABS 0.1 08/14/2019 1550   BASOSABS 0.1 08/14/2019 1550    CMP     Component Value Date/Time   NA 143 08/14/2019 1551   K 3.7 08/14/2019 1551   CL 108 08/14/2019 1551   CO2 21 (L) 08/14/2019 1550   GLUCOSE 86 08/14/2019 1551   BUN 19 08/14/2019 1551   CREATININE 0.90 08/14/2019 1551   CALCIUM 9.4 08/14/2019 1550   PROT 7.2 08/14/2019 1550   ALBUMIN 4.1 08/14/2019 1550   AST 18 08/14/2019 1550   ALT 15 08/14/2019 1550   ALKPHOS 98 08/14/2019 1550   BILITOT 0.7 08/14/2019 1550   GFRNONAA >60 08/14/2019 1550   GFRAA >60  08/14/2019 1550     Imaging I have reviewed the images obtained:  CT-scan of the brain--No acute abnormality  MRI examination of the brain--negative for stroke  Assessment:  Patient with sudden onset of left facial numbness and left sided weakness. Exam shows some inconsistencies in her strength examination as documented above.  Given her risk factors, stat MRI brain was done-that did not show any acute stroke. I am not sure if this is a vascular neurology issue or a complex migraine-no history of migraines.  I would suspect that this is probably related to anxiety and has a nonneurological etiology.   Impression: Left facial numbness and body weakness -MRI negative for stroke.  Recommendations: -No further work-up from a neurological standpoint -She has complaints of right leg pain-work-up and management per primary team Discussed with Dr. Daleen Bo, EDP over the phone Please call neurology with questions  -- Amie Portland, MD [facility] Pager: [phone] If 7pm to 7am, please call on call as listed on AMION.   Attending Neurohospitalist Addendum Patient seen and examined with APP/Resident. Agree with the history and physical as documented above. Agree with the plan as documented, which I helped formulate. I have independently  reviewed the chart, obtained history, review of systems and examined the patient.I have personally reviewed pertinent head/neck/spine imaging (CT/MRI). Please feel free to call with any questions.  --- Amie Portland, MD [facility] Pager: [phone] If 7pm to 7am, please call on call as listed on AMION.

## 2019-08-14 NOTE — Code Documentation (Signed)
49 yo female coming from home with complaints of sudden onset of right facial numbness and right arm numbness that started at 1530. Pt reports being at her baseline at 1430 and then was lying in bed since then. She got up and stated she started to feel her face "draw up." [facility] and activated a Code Stroke.   Stroke Team met patient upon arrival to the ED. EDP [place] cleared airway and labs drawn. Initial NIHSS 4 due to right arm and face numbness, bilateral legs drift, left leg ataxia. CT Head completed. No acute abnormalities per Radiology read. Decision made to bring patient to MRI for emergent scan.   Awaiting results. Not a candidate for tPA due to too mild to treat and inconsistent exam.

## 2019-08-16 ENCOUNTER — Emergency Department (HOSPITAL_COMMUNITY)
Admission: EM | Admit: 2019-08-16 | Discharge: 2019-08-18 | Disposition: A | Discharge status: Home / Self Care | Payer: Medicaid Other | Attending: Emergency Medicine | Admitting: Emergency Medicine

## 2019-08-16 ENCOUNTER — Other Ambulatory Visit: Payer: Self-pay

## 2019-08-16 ENCOUNTER — Emergency Department (HOSPITAL_COMMUNITY): Payer: Medicaid Other

## 2019-08-16 ENCOUNTER — Encounter (HOSPITAL_COMMUNITY): Payer: Self-pay

## 2019-08-16 DIAGNOSIS — M79671 Pain in right foot: Secondary | ICD-10-CM | POA: Insufficient documentation

## 2019-08-16 DIAGNOSIS — Z9889 Other specified postprocedural states: Secondary | ICD-10-CM | POA: Insufficient documentation

## 2019-08-16 DIAGNOSIS — F4325 Adjustment disorder with mixed disturbance of emotions and conduct: Secondary | ICD-10-CM

## 2019-08-16 DIAGNOSIS — R45851 Suicidal ideations: Secondary | ICD-10-CM | POA: Insufficient documentation

## 2019-08-16 LAB — CBC
HCT: 39.6 % (ref 36.0–46.0)
Hemoglobin: 11.8 g/dL — ABNORMAL LOW (ref 12.0–15.0)
MCH: 27.3 pg (ref 26.0–34.0)
MCHC: 29.8 g/dL — ABNORMAL LOW (ref 30.0–36.0)
MCV: 91.5 fL (ref 80.0–100.0)
Platelets: 339 10*3/uL (ref 150–400)
RBC: 4.33 MIL/uL (ref 3.87–5.11)
RDW: 14 % (ref 11.5–15.5)
WBC: 8 10*3/uL (ref 4.0–10.5)
nRBC: 0 % (ref 0.0–0.2)

## 2019-08-16 LAB — I-STAT BETA HCG BLOOD, ED (MC, WL, AP ONLY): I-stat hCG, quantitative: <5 m[IU]/mL (ref <5)

## 2019-08-16 LAB — COMPREHENSIVE METABOLIC PANEL
ALT: 17 U/L (ref 0–44)
AST: 20 U/L (ref 15–41)
Albumin: 4.4 g/dL (ref 3.5–5.0)
Alkaline Phosphatase: 105 U/L (ref 38–126)
Anion gap: 10 (ref 5–15)
BUN: 14 mg/dL (ref 6–20)
CO2: 25 mmol/L (ref 22–32)
Calcium: 9.7 mg/dL (ref 8.9–10.3)
Chloride: 107 mmol/L (ref 98–111)
Creatinine, Ser: 0.95 mg/dL (ref 0.44–1.00)
GFR calc Af Amer: >60 mL/min (ref >60)
GFR calc non Af Amer: >60 mL/min (ref >60)
Glucose, Bld: 90 mg/dL (ref 70–99)
Potassium: 4.1 mmol/L (ref 3.5–5.1)
Sodium: 142 mmol/L (ref 135–145)
Total Bilirubin: 0.4 mg/dL (ref 0.3–1.2)
Total Protein: 7.7 g/dL (ref 6.5–8.1)

## 2019-08-16 LAB — RAPID URINE DRUG SCREEN, HOSP PERFORMED
Amphetamines: NOT DETECTED
Barbiturates: NOT DETECTED
Benzodiazepines: NOT DETECTED
Cocaine: NOT DETECTED
Opiates: NOT DETECTED
Tetrahydrocannabinol: NOT DETECTED

## 2019-08-16 LAB — CBG MONITORING, ED
Glucose-Capillary: 37 mg/dL — CL (ref 70–99)
Glucose-Capillary: 62 mg/dL — ABNORMAL LOW (ref 70–99)
Glucose-Capillary: 70 mg/dL (ref 70–99)

## 2019-08-16 LAB — SALICYLATE LEVEL: Salicylate Lvl: <7 mg/dL — ABNORMAL LOW (ref 7.0–30.0)

## 2019-08-16 LAB — ETHANOL: Alcohol, Ethyl (B): <10 mg/dL (ref <10)

## 2019-08-16 LAB — ACETAMINOPHEN LEVEL: Acetaminophen (Tylenol), Serum: <10 ug/mL — ABNORMAL LOW (ref 10–30)

## 2019-08-16 MED ORDER — LISINOPRIL 10 MG PO TABS
5.0000 mg | ORAL_TABLET | Freq: Every day | ORAL | Status: DC
  Administered 2019-08-17: 5 mg via ORAL
  Filled 2019-08-16: qty 1

## 2019-08-16 MED ORDER — LEVOTHYROXINE SODIUM 100 MCG PO TABS
100.0000 ug | ORAL_TABLET | Freq: Every day | ORAL | Status: DC
  Administered 2019-08-17: 100 ug via ORAL
  Filled 2019-08-16: qty 1

## 2019-08-16 MED ORDER — TIZANIDINE HCL 4 MG PO TABS
4.0000 mg | ORAL_TABLET | Freq: Two times a day (BID) | ORAL | Status: DC | PRN
  Administered 2019-08-16: 4 mg via ORAL
  Filled 2019-08-16: qty 1

## 2019-08-16 MED ORDER — LORAZEPAM 1 MG PO TABS
0.5000 mg | ORAL_TABLET | Freq: Once | ORAL | Status: AC
  Administered 2019-08-16: 0.5 mg via ORAL
  Filled 2019-08-16: qty 1

## 2019-08-16 MED ORDER — GABAPENTIN 100 MG PO CAPS
300.0000 mg | ORAL_CAPSULE | ORAL | Status: DC
  Administered 2019-08-17: 600 mg via ORAL
  Filled 2019-08-16: qty 6

## 2019-08-16 MED ORDER — METFORMIN HCL ER 500 MG PO TB24
500.0000 mg | ORAL_TABLET | ORAL | Status: DC
  Filled 2019-08-16 (×3): qty 1

## 2019-08-16 MED ORDER — ROSUVASTATIN CALCIUM 20 MG PO TABS: 20.0000 mg | ORAL_TABLET | Freq: Every day | ORAL | Status: DC

## 2019-08-16 MED ORDER — LATANOPROST 0.005 % OP SOLN
1.0000 [drp] | Freq: Every day | OPHTHALMIC | Status: DC
  Filled 2019-08-16: qty 2.5

## 2019-08-16 MED ORDER — OXYCODONE-ACETAMINOPHEN 5-325 MG PO TABS
1.0000 | ORAL_TABLET | Freq: Once | ORAL | Status: AC
  Administered 2019-08-16: 1 via ORAL
  Filled 2019-08-16: qty 1

## 2019-08-16 MED ORDER — OXYCODONE-ACETAMINOPHEN 5-325 MG PO TABS
1.0000 | ORAL_TABLET | ORAL | Status: DC
  Administered 2019-08-16: 1 via ORAL
  Filled 2019-08-16: qty 1

## 2019-08-16 NOTE — ED Notes (Signed)
Pt transported to MRI 

## 2019-08-16 NOTE — ED Notes (Signed)
When this RN arrived on shift pt still had her cell phone and was calling family. Pt family member Ruta Hinds is an employee and arrived on ED without wristband and security clearance. Pt gave her cell phone to her sister. RN explained to sister that pt is unable to have visitors at this time. She verbalized understanding and left the department.

## 2019-08-16 NOTE — ED Notes (Signed)
Karen Nixon, sister, [phone] would like an update when available

## 2019-08-16 NOTE — ED Provider Notes (Signed)
Care assumed at shift change from [place], [place], pending MRI L spine and medical clearance for TTS evaluation for SI. See her note for full HPI and workup. Briefly, Karen Nixon presenting with complaint of burning right foot pain s/p lumbar fusion. No neuro deficits, normal pulses. Has told multiple staff members of intent to commit suicide if she leaves the facility today. Karen Nixon under IVC for SI.  Physical Exam  BP (!) 146/99   Pulse 85   Temp 99 F (37.2 C) (Oral)   Resp 20   Ht 5\' 5"  (1.651 m) Comment: Simultaneous filing. User may not have seen previous data.  Wt 84.8 kg Comment: Simultaneous filing. User may not have seen previous data.  SpO2 100%   BMI 31.12 kg/m   Physical Exam Vitals and nursing note reviewed.  Constitutional:      Appearance: She is well-developed.  HENT:     Head: Normocephalic and atraumatic.  Eyes:     Conjunctiva/sclera: Conjunctivae normal.  Cardiovascular:     Rate and Rhythm: Normal rate.  Pulmonary:     Effort: Pulmonary effort is normal.  Abdominal:     Palpations: Abdomen is soft.  Skin:    General: Skin is warm.  Neurological:     Mental Status: She is alert.  Psychiatric:        Mood and Affect: Mood is depressed.        Behavior: Behavior normal. Behavior is cooperative.        Thought Content: Thought content includes suicidal ideation.    Results for orders placed or performed during the hospital encounter of 08/16/19  Comprehensive metabolic panel  Result Value Ref Range   Sodium 142 135 - 145 mmol/L   Potassium 4.1 3.5 - 5.1 mmol/L   Chloride 107 98 - 111 mmol/L   CO2 25 22 - 32 mmol/L   Glucose, Bld 90 70 - 99 mg/dL   BUN 14 6 - 20 mg/dL   Creatinine, Ser 0.95 0.44 - 1.00 mg/dL   Calcium 9.7 8.9 - 10.3 mg/dL   Total Protein 7.7 6.5 - 8.1 g/dL   Albumin 4.4 3.5 - 5.0 g/dL   AST 20 15 - 41 U/L   ALT 17 0 - 44 U/L   Alkaline Phosphatase 105 38 - 126 U/L   Total Bilirubin 0.4 0.3 - 1.2 mg/dL   GFR calc non Af Amer >60 >60 mL/min   GFR  calc Af Amer >60 >60 mL/min   Anion gap 10 5 - 15  Ethanol  Result Value Ref Range   Alcohol, Ethyl (B) <45 <40 mg/dL  Salicylate level  Result Value Ref Range   Salicylate Lvl <9.8 (L) 7.0 - 30.0 mg/dL  Acetaminophen level  Result Value Ref Range   Acetaminophen (Tylenol), Serum <10 (L) 10 - 30 ug/mL  cbc  Result Value Ref Range   WBC 8.0 4.0 - 10.5 K/uL   RBC 4.33 3.87 - 5.11 MIL/uL   Hemoglobin 11.8 (L) 12.0 - 15.0 g/dL   HCT 39.6 36 - 46 %   MCV 91.5 80.0 - 100.0 fL   MCH 27.3 26.0 - 34.0 pg   MCHC 29.8 (L) 30.0 - 36.0 g/dL   RDW 14.0 11.5 - 15.5 %   Platelets 339 150 - 400 K/uL   nRBC 0.0 0.0 - 0.2 %  Rapid urine drug screen (hospital performed)  Result Value Ref Range   Opiates NONE DETECTED NONE DETECTED   Cocaine NONE DETECTED NONE DETECTED  Benzodiazepines NONE DETECTED NONE DETECTED   Amphetamines NONE DETECTED NONE DETECTED   Tetrahydrocannabinol NONE DETECTED NONE DETECTED   Barbiturates NONE DETECTED NONE DETECTED  I-Stat beta hCG blood, ED  Result Value Ref Range   I-stat hCG, quantitative <5.0 <5 mIU/mL   Comment 3          CBG monitoring, ED  Result Value Ref Range   Glucose-Capillary 37 (LL) 70 - 99 mg/dL   Comment 1 Notify RN   CBG monitoring, ED  Result Value Ref Range   Glucose-Capillary 62 (L) 70 - 99 mg/dL  CBG monitoring, ED  Result Value Ref Range   Glucose-Capillary 70 70 - 99 mg/dL    MR Lumbar Spine Wo Contrast  Result Date: 08/16/2019 CLINICAL DATA:  Worsening severe right foot pain. Recent lumbar fusion. EXAM: MRI LUMBAR SPINE WITHOUT CONTRAST TECHNIQUE: Multiplanar, multisequence MR imaging of the lumbar spine was performed. No intravenous contrast was administered. COMPARISON:  Lumbar spine CT 07/05/2019 and MRI 03/14/2019 FINDINGS: Segmentation:  Standard. Alignment: Mild lumbar levoscoliosis. Minimal retrolisthesis of L5 on S1. Vertebrae: Interval anterior and posterior fusion from L3-S1. No fracture, suspicious osseous lesion, or  significant marrow edema. Conus medullaris and cauda equina: Conus extends to the L1 level. Conus and cauda equina appear normal. Paraspinal and other soft tissues: Postoperative changes in the posterior lumbar soft tissues. No fluid collection. Disc levels: L1-2: Negative. L2-3: Normal disc. Mild facet and ligamentum flavum hypertrophy without stenosis, unchanged. L3-4: Previous left laminectomy. Interval discectomy and fusion. No significant residual stenosis. L4-5: Previous left laminectomy. Interval discectomy and fusion. No significant residual stenosis. L5-S1: Previous left laminectomy. Interval discectomy and fusion. Patent spinal canal and lateral recesses. Suspected mild bilateral neural foraminal stenosis due to endplate spurring. IMPRESSION: 1. Interval L3-S1 fusion without residual spinal stenosis. 2. Suspected mild bilateral neural foraminal stenosis at L5-S1. Electronically Signed   By: Logan Bores M.D.   On: 08/16/2019 18:14    ED Course/Procedures     Procedures  MDM  MRI is reassuring. Discussed results with patient. Karen Nixon states she cannot go home in this state, wanting to harm herself. She had some hypoglycemia, likely 2/t only eating a banana early this morning and extended ED stay. Improving with PO food and drink. Karen Nixon is medically cleared for TTS evaluation and disposition.        Linzi Ohlinger, [place] N, PA-C 08/16/19 3536    Dorie Rank, MD 08/17/19 [phone]

## 2019-08-16 NOTE — ED Triage Notes (Signed)
Pt BIB [facility] for eval of R sided foot pain d/t neuropathy. Pt reports she had surgery on 5/10 and has had increasing pain since that time. Pt reports plan to overdose on medications d/t severe pain. Pt has 900mg  of gabapentin on board as well as percocet from home. Pt tearful in triage. States SI since 5/13.

## 2019-08-16 NOTE — ED Notes (Signed)
Pt changed in scrubs.

## 2019-08-16 NOTE — ED Notes (Addendum)
Pt given apple juice and eating cheese and crackers for blood sugar and dinner tray ordered. [place] PA notified

## 2019-08-16 NOTE — ED Notes (Signed)
Pt crying out to sister " I don't want to be here anymore, I don't want to be alive".

## 2019-08-16 NOTE — ED Notes (Signed)
Dinner ordered 

## 2019-08-16 NOTE — ED Notes (Signed)
[place], daughter, [phone] would like an update when available

## 2019-08-16 NOTE — ED Provider Notes (Signed)
[facility] EMERGENCY DEPARTMENT Provider Note   CSN: [id number] Arrival date & time: 08/16/19  1258     History Chief Complaint  Patient presents with  . Foot Pain  . Suicidal    Karen Nixon is a 49 y.o. female with pertinent past medical history of anxiety, depression that presents emergency department today for foot pain and suicidal ideation.  Patient states that she has had foot pain since previous surgery on for her back in May, states that she started having right foot pain since then. Pain described as a burning sensation.  States that the pain hurts so bad, that she plans on overdosing after she leaves on gabapentin.  States that she cannot live like this anymore multiple times during our visit.  Is extremely tearful and states that if we discharge her she will overdose.  Denies any homicidal ideations or hallucinations.  Was previously seen 2 days ago for code stroke with secondary complaint of burning pain on her foot, same complaint as she is coming in today.  She had called her neurosurgeon who called her in prescription of oxycodone, patient states oxycodone was originally working but is not currently working anymore.  Patient states gabapentin is not also working, states that she has been increasing without a prescription. States that she also has occasional leg pain, no paresthesias or weakness in foot or legs. Was able to walk. No fevers, chills, nausea, vomiting, weight changes, incontinence, retention, abdominal pain, chest pain, sob. Denies back pain.  HPI     Past Medical History:  Diagnosis Date  . Anxiety   . DDD (degenerative disc disease), lumbar   . Degenerative disc disease, lumbar   . Depression    due to chronic pain  . Diabetes mellitus without complication ([place])   . Dislocation of shoulder    x 9  . GERD (gastroesophageal reflux disease)   . Glaucoma   . Hyperlipemia   . Hypothyroidism   . Left facial numbness 2010   and tingling   and left arm- years ago.  Dr says it was stress  . Scoliosis   . Shortness of breath dyspnea    with exertation  . Thyroid disease   . Umbilical hernia     Patient Active Problem List   Diagnosis Date Noted  . Lumbar radiculopathy, chronic 07/08/2019  . Degenerative disc disease at L5-S1 level 03/25/2019  . Ventral incisional hernia 03/12/2019  . Type 2 diabetes mellitus with hyperglycemia ([place]) 03/07/2019  . Chronic kidney disease (CKD), stage III (moderate) 02/14/2019  . Mixed hyperlipidemia 02/11/2019  . MDD (major depressive disorder) 02/11/2019  . Foot drop, left foot 12/15/2016  . Syncope 04/18/2016  . Chronic pain 04/18/2016  . 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 BREAST MASS, LEFT 09/26/2007  . RHINITIS, ALLERGIC NOS 10/26/2006  . Graves' disease 10/24/2006  . Hypothyroidism 10/24/2006  . Hypertension 10/24/2006  . HX, PERSONAL, CERVICAL DYSPLASIA 10/24/2006    Past Surgical History:  Procedure Laterality Date  . ABDOMINAL EXPOSURE N/A 07/08/2019   Procedure: ABDOMINAL EXPOSURE;  Surgeon: Rosetta Posner, MD;  Location: [facility] OR;  Service: Vascular;  Laterality: N/A;  . ANTERIOR LUMBAR FUSION N/A 07/08/2019   Procedure: Lumbar Three-Four Lumbar Four-Five Lumbar Five Sacral One Anterior lumbar interbody fusion;  Surgeon: [name], MD;  Location: [place];  Service: Neurosurgery;  Laterality: N/A;  Lumbar Three-Four Lumbar Four-Five Lumbar Five Sacral One Anterior lumbar interbody fusion  . APPLICATION OF ROBOTIC ASSISTANCE  FOR SPINAL PROCEDURE N/A 07/08/2019   Procedure: APPLICATION OF ROBOTIC ASSISTANCE FOR SPINAL PROCEDURE;  Surgeon: [name], MD;  Location: [place];  Service: Neurosurgery;  Laterality: N/A;  . BACK SURGERY  2005  . CERVICAL CONIZATION W/BX    . ELBOW SURGERY Right 2007   fracture repair with screws  . INDUCED ABORTION    . LUMBAR PERCUTANEOUS PEDICLE SCREW 3 LEVEL N/A 07/08/2019   Procedure: Posterior percutaneous pedicle screw  fixation Lumbar Three to Sacral One;  Surgeon: [name], MD;  Location: [place];  Service: Neurosurgery;  Laterality: N/A;  Posterior percutaneous pedicle screw fixation Lumbar Three to Sacral One  . SHOULDER ARTHROSCOPY Right 09/09/2014   Procedure: RIGHT SHOULDER ARTHROSCOPY WITH LABRAL REPAIR;  Surgeon: Mcarthur Rossetti, MD;  Location: [place];  Service: Orthopedics;  Laterality: Right;  . SHOULDER SURGERY Right 2001   arthroscopy  . TUBAL LIGATION  11/20/1996     OB History    Gravida  3   Para  2   Term  2   Preterm      AB  1   Living  2     SAB      TAB      Ectopic      Multiple      Live Births              Family History  Problem Relation Age of Onset  . Cancer Mother   . Hypertension Brother     Social History   Tobacco Use  . Smoking status: Former Smoker    Types: Cigarettes    Quit date: 11/22/2018    Years since quitting: 0.7  . Smokeless tobacco: Never Used  Vaping Use  . Vaping Use: Never used  Substance Use Topics  . Alcohol use: Not Currently    Comment: occasionally a couple of beers  . Drug use: No    Home Medications Prior to Admission medications   Medication Sig Start Date End Date Taking? Authorizing Provider  APPLE CIDER VINEGAR PO Take 1 capsule by mouth daily.   Yes [provider]  azelastine (ASTELIN) 0.1 % nasal spray Place 2 sprays into both nostrils 2 (two) times daily. Use in each nostril as directed Patient taking differently: Place 2 sprays into both nostrils daily as needed for allergies.  06/28/19  Yes Padgett, Rae Halsted, MD  calcium elemental as carbonate (CALCIUM ANTACID ULTRA) 400 MG chewable tablet Chew 3,000 mg by mouth daily as needed for heartburn.   Yes [provider]  cetirizine (ZYRTEC) 10 MG tablet Take 10 mg by mouth daily as needed for allergies.   Yes [provider]  EPINEPHrine 0.3 mg/0.3 mL IJ SOAJ injection Inject 0.3 mg into the muscle as needed for  anaphylaxis.   Yes [provider]  ergocalciferol (VITAMIN D2) 1.25 MG (50000 UT) capsule Take 50,000 Units by mouth every Sunday.  05/24/19 08/24/19 Yes [provider]  gabapentin (NEURONTIN) 300 MG capsule Take 1 capsule (300 mg total) by mouth 3 (three) times daily. Patient taking differently: Take 300-600 mg by mouth See admin instructions. 600 mg in the am , 300 mg in the afternoon , 600 mg niightly 01/22/19  Yes Raylene Everts, MD  ibuprofen (ADVIL) 600 MG tablet Take 600 mg by mouth every 8 (eight) hours as needed for moderate pain.   Yes [provider]  Ketotifen Fumarate (ALAWAY OP) Place 1 drop into both eyes daily as needed (allergies).  Yes [provider]  latanoprost (XALATAN) 0.005 % ophthalmic solution Place 1 drop into both eyes at bedtime.  04/04/19  Yes [provider]  levothyroxine (SYNTHROID) 100 MCG tablet Take 1 tablet (100 mcg total) by mouth daily before breakfast. 01/22/19  Yes Raylene Everts, MD  lisinopril (ZESTRIL) 5 MG tablet Take 5 mg by mouth daily.   Yes [provider]  meloxicam (MOBIC) 7.5 MG tablet Take 7.5 mg by mouth 2 (two) times daily. 08/07/19  Yes [provider]  metFORMIN (GLUCOPHAGE-XR) 500 MG 24 hr tablet Take 500 mg by mouth in the morning and at bedtime.   Yes [provider]  Multiple Vitamin (MULTIVITAMIN WITH MINERALS) TABS tablet Take 2 tablets by mouth See admin instructions. vitafusion gummies   Yes [provider]  oxyCODONE-acetaminophen (PERCOCET/ROXICET) 5-325 MG tablet Take 1 tablet by mouth See admin instructions. Take 1 tab every 4 to 6 hrs 08/14/19  Yes [provider]  rosuvastatin (CRESTOR) 20 MG tablet Take 20 mg by mouth daily.   Yes [provider]  Simethicone 180 MG CAPS Take 360 mg by mouth as needed (gas relief).    Yes [provider]  tiZANidine (ZANAFLEX) 2 MG tablet Take 2 tablets (4 mg total) by mouth every 6  (six) hours as needed for muscle spasms. Patient taking differently: Take 4 mg by mouth in the morning and at bedtime.  07/11/19  Yes [name], MD  oxyCODONE (OXY IR/ROXICODONE) 5 MG immediate release tablet Take 1-2 tablets (5-10 mg total) by mouth every 4 (four) hours as needed for moderate pain or severe pain. Patient not taking: Reported on 08/16/2019 07/11/19   [name], MD  lovastatin (MEVACOR) 20 MG tablet Take 20 mg by mouth at bedtime.  01/22/19  [provider]  traZODone (DESYREL) 50 MG tablet Take 50 mg by mouth at bedtime.   01/22/19  [provider]    Allergies    Lac bovis, Iodinated diagnostic agents, Naproxen sodium, Eggs or egg-derived products, Iohexol, and Lactose intolerance (gi)  Review of Systems   Review of Systems  Constitutional: Negative for chills, diaphoresis, fatigue and fever.  HENT: Negative for congestion, drooling, facial swelling, hearing loss, mouth sores, postnasal drip, rhinorrhea, sinus pressure, sinus pain, sore throat and trouble swallowing.   Eyes: Negative for pain and visual disturbance.  Respiratory: Negative for cough, shortness of breath and wheezing.   Cardiovascular: Negative for chest pain, palpitations and leg swelling.  Gastrointestinal: Negative for abdominal distention, abdominal pain, diarrhea, nausea and vomiting.  Genitourinary: Negative for difficulty urinating.  Musculoskeletal: Positive for myalgias (Foot pain R). Negative for back pain, neck pain and neck stiffness.  Skin: Negative for pallor.  Neurological: Negative for dizziness, speech difficulty, weakness and headaches.  Psychiatric/Behavioral: Positive for agitation and suicidal ideas. Negative for behavioral problems, confusion, decreased concentration, dysphoric mood, hallucinations, self-injury and sleep disturbance. The patient is not nervous/anxious and is not hyperactive.     Physical Exam Updated Vital Signs BP (!) 146/99   Pulse 85   Temp  99 F (37.2 C) (Oral)   Resp 20   Ht 5\' 5"  (1.651 m) Comment: Simultaneous filing. User may not have seen previous data.  Wt 84.8 kg Comment: Simultaneous filing. User may not have seen previous data.  SpO2 100%   BMI 31.12 kg/m   Physical Exam Constitutional:      General: She is not in acute distress.    Appearance: Normal appearance. She is not  ill-appearing, toxic-appearing or diaphoretic.  HENT:     Mouth/Throat:     Mouth: Mucous membranes are moist.     Pharynx: Oropharynx is clear.  Eyes:     General: No scleral icterus.    Extraocular Movements: Extraocular movements intact.     Pupils: Pupils are equal, round, and reactive to light.  Cardiovascular:     Rate and Rhythm: Normal rate and regular rhythm.     Pulses: Normal pulses.     Heart sounds: Normal heart sounds.  Pulmonary:     Effort: Pulmonary effort is normal. No respiratory distress.     Breath sounds: Normal breath sounds. No stridor. No wheezing, rhonchi or rales.  Chest:     Chest wall: No tenderness.  Abdominal:     General: Abdomen is flat. There is no distension.     Palpations: Abdomen is soft.     Tenderness: There is no abdominal tenderness. There is no guarding or rebound.  Musculoskeletal:        General: No swelling or tenderness. Normal range of motion.     Cervical back: Normal range of motion and neck supple. No rigidity.     Right lower leg: No edema.     Left lower leg: No edema.     Comments: Patient with severe tenderness to right foot.  Patient states multiple times she does not want me to touch her foot, after calming the patient down I was able to touch her foot.  Foot without warmth, no overlying infection. Patient with normal cap refill, PT and DP 2+.  Normal sensation throughout.  Normal strength.  Leg without any sensation changes.  Normal strength. No abnormality on left side.   Pt would not let me accesses her back.   Skin:    General: Skin is warm and dry.     Capillary  Refill: Capillary refill takes less than 2 seconds.     Coloration: Skin is not pale.  Neurological:     General: No focal deficit present.     Mental Status: She is alert and oriented to person, place, and time.     Cranial Nerves: No cranial nerve deficit.     Sensory: No sensory deficit.     Motor: No weakness.     Coordination: Coordination normal.  Psychiatric:     Comments: Pt is tearful and yelling in hallway that she needs help, states multiple times that she can not live like this anymore. No tangential speech, no hallucinations.     ED Results / Procedures / Treatments   Labs (all labs ordered are listed, but only abnormal results are displayed) Labs Reviewed  SALICYLATE LEVEL - Abnormal; Notable for the following components:      Result Value   Salicylate Lvl <6.7 (*)    All other components within normal limits  ACETAMINOPHEN LEVEL - Abnormal; Notable for the following components:   Acetaminophen (Tylenol), Serum <10 (*)    All other components within normal limits  CBC - Abnormal; Notable for the following components:   Hemoglobin 11.8 (*)    MCHC 29.8 (*)    All other components within normal limits  COMPREHENSIVE METABOLIC PANEL  ETHANOL  RAPID URINE DRUG SCREEN, HOSP PERFORMED  I-STAT BETA HCG BLOOD, ED ([facility], [facility], [facility] ONLY)    EKG None  Radiology MR BRAIN WO CONTRAST  Result Date: 08/14/2019 CLINICAL DATA:  Acute right arm and face numbness.  Left leg ataxia. EXAM: MRI HEAD WITHOUT CONTRAST  TECHNIQUE: Multiplanar, multiecho pulse sequences of the brain and surrounding structures were obtained without intravenous contrast. COMPARISON:  Head CT 08/14/2019 FINDINGS: Brain: There is no evidence of acute infarct, intracranial hemorrhage, mass, midline shift, or extra-axial fluid collection. The ventricles and sulci are normal. Small foci of T2 hyperintensity in the cerebral white matter bilaterally are nonspecific but compatible with mild chronic small vessel ischemic  disease. There is a mildly expanded partially empty sella. Vascular: Major intracranial vascular flow voids are preserved. Skull and upper cervical spine: Unremarkable bone marrow signal. Sinuses/Orbits: Unremarkable orbits. Paranasal sinuses and mastoid air cells are clear. Other: None. IMPRESSION: 1. No acute intracranial abnormality. 2. Mild chronic small vessel ischemic disease. Electronically Signed   By: Logan Bores M.D.   On: 08/14/2019 16:40    Procedures Procedures (including critical care time)  Medications Ordered in ED Medications  LORazepam (ATIVAN) tablet 0.5 mg (has no administration in time range)  oxyCODONE-acetaminophen (PERCOCET/ROXICET) 5-325 MG per tablet 1 tablet (1 tablet Oral Given 08/16/19 1519)    ED Course  I have reviewed the triage vital signs and the nursing notes.  Pertinent labs & imaging results that were available during my care of the patient were reviewed by me and considered in my medical decision making (see chart for details).    MDM Rules/Calculators/A&P                         Karen Nixon is a 49 y.o. female with pertinent past medical history of anxiety, depression that presents emergency department today for foot pain and suicidal ideation. Patient has told multiple staff that she wants to commit suicide and she is going to leave.  When assessing the patient patient states that she is going to overdose on Wellbutrin if we let her go.  Patient states that she cannot live like this anymore multiple times.  Denies any homicidal ideations or hallucinations.  I did IVC patient at this time since patient is threatening to go and patient is danger to self.  Patient's pain is being controlled with oxycodone.  Ativan given for anxiety.  Pt care was handed off to J. Robinson PA-C at 348.  Complete history and physical and current plan have been communicated.  Please refer to their note for the remainder of ED care and ultimate disposition.  Awaiting to be  medically cleared, TTS consult has not been placed.  This chart was discussed with Dr. Tomi Bamberger who agreed with the care and disposition of the patient.    Final Clinical Impression(s) / ED Diagnoses Final diagnoses:  Suicidal ideation  Foot pain, right    Rx / DC Orders ED Discharge Orders    None       Alfredia Client, PA-C 08/16/19 1734    Dorie Rank, MD 08/17/19 [phone]

## 2019-08-17 DIAGNOSIS — F4325 Adjustment disorder with mixed disturbance of emotions and conduct: Secondary | ICD-10-CM

## 2019-08-17 DIAGNOSIS — R45851 Suicidal ideations: Secondary | ICD-10-CM

## 2019-08-17 LAB — CBG MONITORING, ED
Glucose-Capillary: 118 mg/dL — ABNORMAL HIGH (ref 70–99)
Glucose-Capillary: 85 mg/dL (ref 70–99)

## 2019-08-17 MED ORDER — PREGABALIN 50 MG PO CAPS
50.0000 mg | ORAL_CAPSULE | Freq: Three times a day (TID) | ORAL | 0 refills | Status: AC
Start: 2019-08-17 — End: ?

## 2019-08-17 MED ORDER — TRAMADOL HCL 50 MG PO TABS
50.0000 mg | ORAL_TABLET | Freq: Once | ORAL | Status: AC
  Administered 2019-08-17: 50 mg via ORAL
  Filled 2019-08-17: qty 1

## 2019-08-17 MED ORDER — DIPHENHYDRAMINE HCL 25 MG PO CAPS
50.0000 mg | ORAL_CAPSULE | Freq: Once | ORAL | Status: AC
  Administered 2019-08-17: 50 mg via ORAL
  Filled 2019-08-17: qty 2

## 2019-08-17 MED ORDER — OXYCODONE-ACETAMINOPHEN 5-325 MG PO TABS: 1.0000 | ORAL_TABLET | Freq: Once | ORAL | Status: DC

## 2019-08-17 NOTE — ED Notes (Signed)
Son en route to pick up patient

## 2019-08-17 NOTE — ED Notes (Signed)
Pt complaining of itching. PA made aware.

## 2019-08-17 NOTE — ED Provider Notes (Signed)
Patient signed out to me by Donnelly Angelica, PA-C.  Please see previous notes for further history.  In brief, patient pending TTS consult.  She has been medically cleared.  She is reporting SI.  She is under IVC.  Repaged TTS multiple times.   Discussed with TTS provider. Apparently pt had been psychiatrically cleared several hours ago.  However there is no note in the chart to communicate this or explain why.  Patient continues to report suicidal thoughts to multiple staff members.  As such, will have patient continue to hold in the ED and be reassessed by psych in the morning.  The patient has been placed in psychiatric observation due to the need to provide a safe environment for the patient while obtaining psychiatric consultation and evaluation, as well as ongoing medical and medication management to treat the patient's condition.  The patient has been placed under full IVC at this time.    Franchot Heidelberg, PA-C 08/17/19 [id number]    Palumbo, April, MD 08/17/19 [phone]

## 2019-08-17 NOTE — ED Notes (Signed)
Breakfast Ordered 

## 2019-08-17 NOTE — ED Notes (Signed)
Secretary stated [facility] will TTS pt after 7AM.

## 2019-08-17 NOTE — ED Notes (Signed)
RN updated pt sister Minna Merritts with pt approval.

## 2019-08-17 NOTE — ED Notes (Signed)
Belongings returned to patient. No valuables

## 2019-08-17 NOTE — Consult Note (Signed)
Telepsych Consultation   Reason for Consult:  Evaluate suicidal ideation with plan Referring Physician:  EDP [facility] of [facility] Location of Provider: [place] Department  Patient Identification: Karen Nixon MRN:  [id number] Principal Diagnosis: Adjustment disorder with mixed disturbance of emotions and conduct Diagnosis:  Principal Problem:   Adjustment disorder with mixed disturbance of emotions and conduct Active Problems:   Suicidal ideations   Total Time spent with patient: 30 minutes  Subjective:   Karen Nixon, 49 y.o., female patient presented to [facility] emergency department initially for evaluation of right foot pain s/p lumbar surgery.  During assessment, she voiced suicidal ideations with a plan for self harm in the setting of uncontrolled pain and was subsequently IVC'd by the EDP for safety concerns .   Patient seen via telepsych by this provider; chart reviewed and consulted with Dr. Dwyane Dee on 08/17/19.  On evaluation Karen Nixon reports she had spine surgery in May 2021 and developed right foot paresthesia afterwards.  She describes intermittent but intense sensation of "walking on glass chard" mainly to her right great toe that eventually involves her entire right foot that has been present for 6 weeks.  She is followed by neurosurgery and states they have attempted to manage her concerns with oxycodone, tylenol, and her gabapentin was recently increased with marginal improvement in pain.  Patient relates, "pain takes me to dark places and I don't like to reach out for help".  She reports a history chronic low back pain and left lower extremity radiculopathy that has made it difficult to work.  States she was previously prescribed antidepressant medication while she was filing for disability payments and felt overwhelmed.  She cannot recall the name of the medication or if it helped her symptoms but decided to stop taking it when her disability  claim was improved as she felt she had "plateaued and no longer needed the medication".  She denies history for self injurious behaviors or inpatient psychiatric hospitalizations.    She denies 1st degree relative history for mental illness but does report distant cousins with bipolar and depression concerns. Denies familial history to suicide.   She denies use of alcohol,nicotine, marijuana or other illicit substances, her UDS was negative.  She states she has family members, in particular, her daughter and her sister both of whom can provide support but she has not reached out for help citing her desire for self sufficiency.    Apparently her sister Karen Nixon, visited her at the hospital yesterday and now the patient  denies the need for self harm.  She states she is comfortable with reaching out to her family for physical assistance and emotional support while she's in rehab from back surgery.  She will also continue to work with her surgical team    Collateral: I spoke with her sister, Ms. Arnoldo Morale, who denies safety concerns with her sister being discharged.  She also communicated her plan to take care of her sister and support her through rehab.       During evaluation TAVI GAUGHRAN is laying on hospital gurney; She is alert/oriented x 4; She is cooperative but her mood is dysphoric with congruent with affect.  Patient is speaking in a clear tone at moderate volume, and normal pace; with good eye contact.  Her thought process is coherent and relevant; There is no indication that she is currently responding to internal/external stimuli or experiencing delusional thought content.  Patient denies suicidal/self-harm/homicidal  ideation, psychosis, and paranoia.  Patient has remained cooperative  throughout assessment, and has answered questions appropriately.    Past Psychiatric History: depression  Risk to Self:   Risk to Others:   Prior Inpatient Therapy:   Prior Outpatient Therapy:     Past Medical History:  Past Medical History:  Diagnosis Date  . Anxiety   . DDD (degenerative disc disease), lumbar   . Degenerative disc disease, lumbar   . Depression    due to chronic pain  . Diabetes mellitus without complication ([place])   . Dislocation of shoulder    x 9  . GERD (gastroesophageal reflux disease)   . Glaucoma   . Hyperlipemia   . Hypothyroidism   . Left facial numbness 2010   and tingling  and left arm- years ago.  Dr says it was stress  . Scoliosis   . Shortness of breath dyspnea    with exertation  . Thyroid disease   . Umbilical hernia     Past Surgical History:  Procedure Laterality Date  . ABDOMINAL EXPOSURE N/A 07/08/2019   Procedure: ABDOMINAL EXPOSURE;  Surgeon: Rosetta Posner, MD;  Location: [facility] OR;  Service: Vascular;  Laterality: N/A;  . ANTERIOR LUMBAR FUSION N/A 07/08/2019   Procedure: Lumbar Three-Four Lumbar Four-Five Lumbar Five Sacral One Anterior lumbar interbody fusion;  Surgeon: [name], MD;  Location: [place];  Service: Neurosurgery;  Laterality: N/A;  Lumbar Three-Four Lumbar Four-Five Lumbar Five Sacral One Anterior lumbar interbody fusion  . APPLICATION OF ROBOTIC ASSISTANCE FOR SPINAL PROCEDURE N/A 07/08/2019   Procedure: APPLICATION OF ROBOTIC ASSISTANCE FOR SPINAL PROCEDURE;  Surgeon: [name], MD;  Location: [place];  Service: Neurosurgery;  Laterality: N/A;  . BACK SURGERY  2005  . CERVICAL CONIZATION W/BX    . ELBOW SURGERY Right 2007   fracture repair with screws  . INDUCED ABORTION    . LUMBAR PERCUTANEOUS PEDICLE SCREW 3 LEVEL N/A 07/08/2019   Procedure: Posterior percutaneous pedicle screw fixation Lumbar Three to Sacral One;  Surgeon: [name], MD;  Location: [place];  Service: Neurosurgery;  Laterality: N/A;  Posterior percutaneous pedicle screw fixation Lumbar Three to Sacral One  . SHOULDER ARTHROSCOPY Right 09/09/2014   Procedure: RIGHT SHOULDER ARTHROSCOPY WITH LABRAL REPAIR;  Surgeon: Mcarthur Rossetti, MD;   Location: [place];  Service: Orthopedics;  Laterality: Right;  . SHOULDER SURGERY Right 2001   arthroscopy  . TUBAL LIGATION  11/20/1996   Family History:  Family History  Problem Relation Age of Onset  . Cancer Mother   . Hypertension Brother    Family Psychiatric  History:  Social History:  Social History   Substance and Sexual Activity  Alcohol Use Not Currently   Comment: occasionally a couple of beers     Social History   Substance and Sexual Activity  Drug Use No    Social History   Socioeconomic History  . Marital status: Divorced    Spouse name: Not on file  . Number of children: Not on file  . Years of education: Not on file  . Highest education level: Not on file  Occupational History  . Not on file  Tobacco Use  . Smoking status: Former Smoker    Types: Cigarettes    Quit date: 11/22/2018    Years since quitting: 0.7  . Smokeless tobacco: Never Used  Vaping Use  . Vaping Use: Never used  Substance and Sexual Activity  . Alcohol use: Not Currently  Comment: occasionally a couple of beers  . Drug use: No  . Sexual activity: Never  Other Topics Concern  . Not on file  Social History Narrative  . Not on file   Social Determinants of Health   Financial Resource Strain:   . Difficulty of Paying Living Expenses:   Food Insecurity:   . Worried About Charity fundraiser in the Last Year:   . Arboriculturist in the Last Year:   Transportation Needs:   . Film/video editor (Medical):   Marland Kitchen Lack of Transportation (Non-Medical):   Physical Activity:   . Days of Exercise per Week:   . Minutes of Exercise per Session:   Stress:   . Feeling of Stress :   Social Connections:   . Frequency of Communication with Friends and Family:   . Frequency of Social Gatherings with Friends and Family:   . Attends Religious Services:   . Active Member of Clubs or Organizations:   . Attends Archivist Meetings:   Marland Kitchen Marital Status:    Additional Social  History:    Allergies:   Allergies  Allergen Reactions  . Lac Bovis Other (See Comments), Diarrhea, Nausea And Vomiting and Palpitations  . Iodinated Diagnostic Agents Other (See Comments) and Hives     Code: HIVES, Desc: Pt developed hives on neck, face and back with itching. Given 50mg  oral benadryl., Onset Date: 55732202   . Naproxen Sodium Hives    swelling per pt  . Eggs Or Egg-Derived Products Other (See Comments)    Cramps ,pt passing out  . Iohexol      Code: HIVES, Desc: Pt developed hives on neck, face and back with itching. Given 50mg  oral benadryl., Onset Date: 54270623   . Lactose Intolerance (Gi) Diarrhea, Nausea And Vomiting and Palpitations    Labs:  Results for orders placed or performed during the hospital encounter of 08/16/19 (from the past 48 hour(s))  I-Stat beta hCG blood, ED     Status: None   Collection Time: 08/16/19  1:19 PM  Result Value Ref Range   I-stat hCG, quantitative <5.0 <5 mIU/mL   Comment 3            Comment:   GEST. AGE      CONC.  (mIU/mL)   <=1 WEEK        5 - 50     2 WEEKS       50 - 500     3 WEEKS       100 - 10,000     4 WEEKS     1,000 - 30,000        FEMALE AND NON-PREGNANT FEMALE:     LESS THAN 5 mIU/mL   Comprehensive metabolic panel     Status: None   Collection Time: 08/16/19  1:33 PM  Result Value Ref Range   Sodium 142 135 - 145 mmol/L   Potassium 4.1 3.5 - 5.1 mmol/L   Chloride 107 98 - 111 mmol/L   CO2 25 22 - 32 mmol/L   Glucose, Bld 90 70 - 99 mg/dL    Comment: Glucose reference range applies only to samples taken after fasting for at least 8 hours.   BUN 14 6 - 20 mg/dL   Creatinine, Ser 0.95 0.44 - 1.00 mg/dL   Calcium 9.7 8.9 - 10.3 mg/dL   Total Protein 7.7 6.5 - 8.1 g/dL   Albumin 4.4 3.5 - 5.0 g/dL  AST 20 15 - 41 U/L   ALT 17 0 - 44 U/L   Alkaline Phosphatase 105 38 - 126 U/L   Total Bilirubin 0.4 0.3 - 1.2 mg/dL   GFR calc non Af Amer >60 >60 mL/min   GFR calc Af Amer >60 >60 mL/min   Anion gap  10 5 - 15    Comment: Performed at [facility] [address], [place], [place] [address]  Ethanol     Status: None   Collection Time: 08/16/19  1:33 PM  Result Value Ref Range   Alcohol, Ethyl (B) <10 <10 mg/dL    Comment: (NOTE) Lowest detectable limit for serum alcohol is 10 mg/dL.  For medical purposes only. Performed at [facility], [place] [address] [place], [place] [address]   Salicylate level     Status: Abnormal   Collection Time: 08/16/19  1:33 PM  Result Value Ref Range   Salicylate Lvl <4.2 (L) 7.0 - 30.0 mg/dL    Comment: Performed at [facility] [address], [place], [place] [address]  Acetaminophen level     Status: Abnormal   Collection Time: 08/16/19  1:33 PM  Result Value Ref Range   Acetaminophen (Tylenol), Serum <10 (L) 10 - 30 ug/mL    Comment: (NOTE) Therapeutic concentrations vary significantly. A range of 10-30 ug/mL  may be an effective concentration for many patients. However, some  are best treated at concentrations outside of this range. Acetaminophen concentrations >150 ug/mL at 4 hours after ingestion  and >50 ug/mL at 12 hours after ingestion are often associated with  toxic reactions.  Performed at [facility], [place] [address], [place], [place] [address]   cbc     Status: Abnormal   Collection Time: 08/16/19  1:33 PM  Result Value Ref Range   WBC 8.0 4.0 - 10.5 K/uL   RBC 4.33 3.87 - 5.11 MIL/uL   Hemoglobin 11.8 (L) 12.0 - 15.0 g/dL   HCT 39.6 36 - 46 %   MCV 91.5 80.0 - 100.0 fL   MCH 27.3 26.0 - 34.0 pg   MCHC 29.8 (L) 30.0 - 36.0 g/dL   RDW 14.0 11.5 - 15.5 %   Platelets 339 150 - 400 K/uL   nRBC 0.0 0.0 - 0.2 %    Comment: Performed at [facility], [place] [address], [place], [place] [address]  Rapid urine drug screen (hospital performed)     Status: None   Collection Time: 08/16/19  4:40 PM  Result Value Ref Range   Opiates NONE DETECTED NONE DETECTED   Cocaine NONE DETECTED NONE DETECTED    Benzodiazepines NONE DETECTED NONE DETECTED   Amphetamines NONE DETECTED NONE DETECTED   Tetrahydrocannabinol NONE DETECTED NONE DETECTED   Barbiturates NONE DETECTED NONE DETECTED    Comment: (NOTE) DRUG SCREEN FOR MEDICAL PURPOSES ONLY.  IF CONFIRMATION IS NEEDED FOR ANY PURPOSE, NOTIFY LAB WITHIN 5 DAYS.  LOWEST DETECTABLE LIMITS FOR URINE DRUG SCREEN Drug Class                     Cutoff (ng/mL) Amphetamine and metabolites    1000 Barbiturate and metabolites    200 Benzodiazepine                 761 Tricyclics and metabolites     300 Opiates and metabolites        300 Cocaine and metabolites  300 THC                            50 Performed at [facility], [place] [address] [place], [place] [address]   CBG monitoring, ED     Status: Abnormal   Collection Time: 08/16/19  7:30 PM  Result Value Ref Range   Glucose-Capillary 37 (LL) 70 - 99 mg/dL    Comment: Glucose reference range applies only to samples taken after fasting for at least 8 hours.   Comment 1 Notify RN   CBG monitoring, ED     Status: Abnormal   Collection Time: 08/16/19  7:57 PM  Result Value Ref Range   Glucose-Capillary 62 (L) 70 - 99 mg/dL    Comment: Glucose reference range applies only to samples taken after fasting for at least 8 hours.  CBG monitoring, ED     Status: None   Collection Time: 08/16/19  8:37 PM  Result Value Ref Range   Glucose-Capillary 70 70 - 99 mg/dL    Comment: Glucose reference range applies only to samples taken after fasting for at least 8 hours.  CBG monitoring, ED     Status: Abnormal   Collection Time: 08/17/19  1:08 AM  Result Value Ref Range   Glucose-Capillary 118 (H) 70 - 99 mg/dL    Comment: Glucose reference range applies only to samples taken after fasting for at least 8 hours.  CBG monitoring, ED     Status: None   Collection Time: 08/17/19 12:45 PM  Result Value Ref Range   Glucose-Capillary 85 70 - 99 mg/dL    Comment: Glucose reference range applies  only to samples taken after fasting for at least 8 hours.    Medications:  Current Facility-Administered Medications  Medication Dose Route Frequency Provider Last Rate Last Admin  . gabapentin (NEURONTIN) capsule 300-600 mg  300-600 mg Oral See admin instructions Robinson, [place] N, PA-C   600 mg at 08/17/19 8676  . latanoprost (XALATAN) 0.005 % ophthalmic solution 1 drop  1 drop Both Eyes QHS Quentin Cornwall, [place] N, [place]      . levothyroxine (SYNTHROID) tablet 100 mcg  100 mcg Oral QAC breakfast Robinson, [place] N, PA-C   100 mcg at 08/17/19 7209  . lisinopril (ZESTRIL) tablet 5 mg  5 mg Oral Daily Robinson, [place] N, PA-C   5 mg at 08/17/19 1543  . metFORMIN (GLUCOPHAGE-XR) 24 hr tablet 500 mg  500 mg Oral BH-qamhs Robinson, [place] N, [place]      . oxyCODONE-acetaminophen (PERCOCET/ROXICET) 5-325 MG per tablet 1 tablet  1 tablet Oral See admin instructions Robinson, [place] N, PA-C   1 tablet at 08/16/19 2353  . rosuvastatin (CRESTOR) tablet 20 mg  20 mg Oral Daily Quentin Cornwall, [place] N, PA-C      . tiZANidine (ZANAFLEX) tablet 4 mg  4 mg Oral BID PRN Robinson, [place] N, PA-C   4 mg at 08/16/19 2246   Current Outpatient Medications  Medication Sig Dispense Refill  . APPLE CIDER VINEGAR PO Take 1 capsule by mouth daily.    Marland Kitchen azelastine (ASTELIN) 0.1 % nasal spray Place 2 sprays into both nostrils 2 (two) times daily. Use in each nostril as directed (Patient taking differently: Place 2 sprays into both nostrils daily as needed for allergies. ) 30 mL 5  . calcium elemental as carbonate (CALCIUM ANTACID ULTRA) 400 MG chewable tablet Chew 3,000 mg by mouth daily as needed for  heartburn.    . cetirizine (ZYRTEC) 10 MG tablet Take 10 mg by mouth daily as needed for allergies.    Marland Kitchen EPINEPHrine 0.3 mg/0.3 mL IJ SOAJ injection Inject 0.3 mg into the muscle as needed for anaphylaxis.    Marland Kitchen ergocalciferol (VITAMIN D2) 1.25 MG (50000 UT) capsule Take 50,000 Units by mouth every Sunday.     . gabapentin (NEURONTIN)  300 MG capsule Take 1 capsule (300 mg total) by mouth 3 (three) times daily. (Patient taking differently: Take 300-600 mg by mouth See admin instructions. 600 mg in the am , 300 mg in the afternoon , 600 mg niightly) 90 capsule 1  . ibuprofen (ADVIL) 600 MG tablet Take 600 mg by mouth every 8 (eight) hours as needed for moderate pain.    Marland Kitchen Ketotifen Fumarate (ALAWAY OP) Place 1 drop into both eyes daily as needed (allergies).    . latanoprost (XALATAN) 0.005 % ophthalmic solution Place 1 drop into both eyes at bedtime.     Marland Kitchen levothyroxine (SYNTHROID) 100 MCG tablet Take 1 tablet (100 mcg total) by mouth daily before breakfast. 30 tablet 1  . lisinopril (ZESTRIL) 5 MG tablet Take 5 mg by mouth daily.    . meloxicam (MOBIC) 7.5 MG tablet Take 7.5 mg by mouth 2 (two) times daily.    . metFORMIN (GLUCOPHAGE-XR) 500 MG 24 hr tablet Take 500 mg by mouth in the morning and at bedtime.    . Multiple Vitamin (MULTIVITAMIN WITH MINERALS) TABS tablet Take 2 tablets by mouth See admin instructions. vitafusion gummies    . oxyCODONE-acetaminophen (PERCOCET/ROXICET) 5-325 MG tablet Take 1 tablet by mouth See admin instructions. Take 1 tab every 4 to 6 hrs    . rosuvastatin (CRESTOR) 20 MG tablet Take 20 mg by mouth daily.    . Simethicone 180 MG CAPS Take 360 mg by mouth as needed (gas relief).     Marland Kitchen tiZANidine (ZANAFLEX) 2 MG tablet Take 2 tablets (4 mg total) by mouth every 6 (six) hours as needed for muscle spasms. (Patient taking differently: Take 4 mg by mouth in the morning and at bedtime. ) 60 tablet 0  . oxyCODONE (OXY IR/ROXICODONE) 5 MG immediate release tablet Take 1-2 tablets (5-10 mg total) by mouth every 4 (four) hours as needed for moderate pain or severe pain. (Patient not taking: Reported on 08/16/2019) 60 tablet 0    Musculoskeletal: Did not assess via telephysch  Psychiatric Specialty Exam: Physical Exam  Constitutional: She is cooperative.  Neurological: She is alert.  Psychiatric: Her  speech is normal. Judgment and thought content normal. Thought content is not delusional. She expresses no suicidal plans and no homicidal plans.    Review of Systems  Psychiatric/Behavioral: Positive for dysphoric mood (related to right foot pain). Negative for agitation, behavioral problems, confusion, hallucinations, self-injury and suicidal ideas (improved since admission as patient now denies). The patient is not nervous/anxious and is not hyperactive.     Blood pressure (!) 145/100, pulse 84, temperature 98.4 F (36.9 C), temperature source Oral, resp. rate 18, height 5\' 5"  (1.651 m), weight 84.8 kg, SpO2 100 %.Body mass index is 31.12 kg/m.  General Appearance: Casual and intermittent grimacing with pain  Eye Contact:  Good  Speech:  Clear and Coherent  Volume:  Normal  Mood:  Dysphoric  Affect:  Appropriate and Tearful  Thought Process:  Coherent and Descriptions of Associations: Intact  Orientation:  Full (Time, Place, and Person)  Thought Content:  Logical  Suicidal Thoughts:  improved since admission  Homicidal Thoughts:  No  Memory:  Immediate;   Good Recent;   Good Remote;   Good  Judgement:  Good  Insight:  Good  Psychomotor Activity:  Increased  Concentration:  Concentration: Good and Attention Span: Fair, sometimes attention is redirect to her pain  Recall:  Good  Fund of Knowledge:  Good  Language:  Good  Akathisia:  Negative  Handed:  Right  AIMS (if indicated):     Assets:  Communication Skills Desire for Improvement Housing Resilience Social Support  ADL's:  Intact  Cognition:  WNL  Sleep:        Treatment Plan Summary: Plan- As per above assessment, there are no current grounds for involuntary commitment at this time.   Patient is not currently interested in inpatient services or outpatient mental health services, but she is future oriented and expresses agreement to continue outpatient follow up with her neurosurgery team for ongoing care.  Her  current mood instability is triggered by pain, thus I spoke with the EDP regarding upward titration of her gabapentin for neuropathic pain as appropriate in lieu of her diabetes dx; if gabapentin is sub therapeutic consider switching to lyrica for nerve pain; in addition, duloxetine 30 mg po daily with upward titration as needed to achieve therapeutic response.  She understands that some of her pain may be chronic but the immediate goal is to get her symptoms to a tolerable level.  I believe this is a good start in helping to reduce her pain and secondary depressive symptoms.    Disposition: No evidence of imminent risk to self or others at present.   Patient does not meet criteria for psychiatric inpatient admission. Supportive therapy provided about ongoing stressors. Discussed crisis plan, support from social network, calling 911, coming to the Emergency Department, and calling Suicide Hotline.  Above discussed with patient concordance.   Spoke with Dr. Francia Greaves, [place]; informed of above recommendation and disposition.  Information was also shared with patient's nurse RN Thurmond Butts.   This service was provided via telemedicine using a 2-way, interactive audio and video technology.  Names of all persons participating in this telemedicine service and their role in this encounter. Name: Kaira Stringfield Role: Patient  Name: Merlyn Lot Role: PMHNP    Mallie Darting, NP 08/17/2019 7:01 PM

## 2019-08-17 NOTE — ED Notes (Signed)
Pt belongings placed in locker #8

## 2019-08-17 NOTE — BH Assessment (Signed)
Clinician spoke to pt's provider, Franchot He[place], [place], at [phone], as she was inquiring about pt's disposition. Clinician looked through pt's chart and notes and was unable to find any notes determining pt's disposition or that she had been seen with the exception of a note in the shift report that states pt has been psych cleared. Pt's provider stated pt has told numerous people that she plans to kill herself upon being d/c from the hospital, so she plans to hold pt in the hospital at this time and requests pt be re-assessed in the morning. Clinician stated she would document this conversation and ensure pt is re-assessed later today.

## 2019-08-17 NOTE — Discharge Instructions (Addendum)
Return for any problem.  Follow-up with your regular care provider as instructed.  Please stop gabapentin.  Trial Lyrica as prescribed for pain control.

## 2020-02-26 ENCOUNTER — Ambulatory Visit: Payer: Medicaid Other | Attending: Neurological Surgery

## 2020-02-26 ENCOUNTER — Other Ambulatory Visit: Payer: Self-pay

## 2020-02-26 DIAGNOSIS — M21372 Foot drop, left foot: Secondary | ICD-10-CM | POA: Diagnosis present

## 2020-02-26 DIAGNOSIS — M545 Low back pain, unspecified: Secondary | ICD-10-CM | POA: Diagnosis present

## 2020-02-26 DIAGNOSIS — G8929 Other chronic pain: Secondary | ICD-10-CM | POA: Diagnosis present

## 2020-02-26 DIAGNOSIS — M5136 Other intervertebral disc degeneration, lumbar region: Secondary | ICD-10-CM | POA: Insufficient documentation

## 2020-02-26 DIAGNOSIS — R2689 Other abnormalities of gait and mobility: Secondary | ICD-10-CM | POA: Insufficient documentation

## 2020-02-26 DIAGNOSIS — M62830 Muscle spasm of back: Secondary | ICD-10-CM

## 2020-02-27 NOTE — Patient Instructions (Signed)
  Forward flexion laterally as well

## 2020-02-27 NOTE — Therapy (Signed)
[place], [place], [address] Phone: [phone]   Fax:  [phone]  Physical Therapy Evaluation  Patient Details  Name: Karen Nixon MRN: [id number] Date of Birth: [birth year] Referring Provider (PT): [name], MD   Encounter Date: 02/26/2020    Past Medical History:  Diagnosis Date  . Anxiety   . DDD (degenerative disc disease), lumbar   . Degenerative disc disease, lumbar   . Depression    due to chronic pain  . Diabetes mellitus without complication ([place])   . Dislocation of shoulder    x 9  . GERD (gastroesophageal reflux disease)   . Glaucoma   . Hyperlipemia   . Hypothyroidism   . Left facial numbness 2010   and tingling  and left arm- years ago.  Dr says it was stress  . Scoliosis   . Shortness of breath dyspnea    with exertation  . Thyroid disease   . Umbilical hernia     Past Surgical History:  Procedure Laterality Date  . ABDOMINAL EXPOSURE N/A 07/08/2019   Procedure: ABDOMINAL EXPOSURE;  Surgeon: Rosetta Posner, MD;  [facility];  Service: Vascular;  Laterality: N/A;  . ANTERIOR LUMBAR FUSION N/A 07/08/2019   Procedure: Lumbar Three-Four Lumbar Four-Five Lumbar Five Sacral One Anterior lumbar interbody fusion;  Surgeon: [name], MD;  Location: [facility];  Service: Neurosurgery;  Laterality: N/A;  Lumbar Three-Four Lumbar Four-Five Lumbar Five Sacral One Anterior lumbar interbody fusion  . APPLICATION OF ROBOTIC ASSISTANCE FOR SPINAL PROCEDURE N/A 07/08/2019   Procedure: APPLICATION OF ROBOTIC ASSISTANCE FOR SPINAL PROCEDURE;  Surgeon: [name], MD;  Location: [facility];  Service: Neurosurgery;  Laterality: N/A;  . BACK SURGERY  2005  . CERVICAL CONIZATION W/BX    . ELBOW SURGERY Right 2007   fracture repair with screws  . INDUCED ABORTION    . LUMBAR PERCUTANEOUS PEDICLE SCREW 3 LEVEL N/A 07/08/2019   Procedure: Posterior percutaneous pedicle screw fixation Lumbar Three  to Sacral One;  Surgeon: [name], MD;  Location: [facility];  Service: Neurosurgery;  Laterality: N/A;  Posterior percutaneous pedicle screw fixation Lumbar Three to Sacral One  . SHOULDER ARTHROSCOPY Right 09/09/2014   Procedure: RIGHT SHOULDER ARTHROSCOPY WITH LABRAL REPAIR;  Surgeon: Mcarthur Rossetti, MD;  Location: [place] [facility];  Service: Orthopedics;  Laterality: Right;  . SHOULDER SURGERY Right 2001   arthroscopy  . TUBAL LIGATION  11/20/1996    There were no vitals filed for this visit.    Subjective Assessment - 02/26/20 1415    Subjective Pt reports having a lumbar fusion in May. Pt states she has been having pinching with the L low back which was going on prior to the surgery. Pt would like to walk 30 mins a day to help her lose weight, but it makes her pain worse.Pt reports 1 set back after surgery c a burning sensation with the R foot that resolved mostly with meds.    Pertinent History Procedure: Lumbar Three-Four Lumbar Four-Five Lumbar Five Sacral One Anterior lumbar interbody fusion;    Patient Stated Goals To minimize the pinching sensation with her L low back    Currently in Pain? Yes    Pain Score 4    pain range 4-5/10   Pain Location Back    Pain Orientation Left    Pain Descriptors / Indicators Other (Comment)   pinch and tired   Pain Type Chronic pain    Pain Onset  More than a month ago    Pain Frequency Constant    Aggravating Factors  lying R side, prolonged walking and standing    Pain Relieving Factors rest, med, lying on L side or back              [facility] PT Assessment - 02/27/20 0001      Assessment   Medical Diagnosis Other intervertebral disc degeneration, lumbar region    Referring Provider (PT) [name], MD    Prior Therapy No      Precautions   Precautions None      Restrictions   Weight Bearing Restrictions No      Balance Screen   Has the patient fallen in the past 6 months No      [place]  residence    Living Arrangements Alone    Type of [place] to enter    Entrance Stairs-Number of Steps 4    Entrance Stairs-Rails None    Home Layout Two level    Alternate Level Stairs-Number of Steps 14    Alternate Level Stairs-Rails Left      Prior Function   Level of Independence Independent    Vocation Unemployed      Cognition   Overall Cognitive Status Within Functional Limits for tasks assessed      Observation/Other Assessments   Observations R drop foot since 2018    Focus on Therapeutic Outcomes (FOTO)  NA      Sensation   Light Touch Impaired by gross assessment   L foot     Posture/Postural Control   Posture/Postural Control Postural limitations    Postural Limitations Forward head;Rounded Shoulders;Decreased thoracic kyphosis   scoliosis, concave L, rib hump R     ROM / Strength   AROM / PROM / Strength AROM;Strength      AROM   Overall AROM Comments Trunk ROM were found to be WFLS. Pinching sensation was increased with R rotation      Palpation   SI assessment  PSIS, ASIS, and medial malleoli are level. SI distraction and compression are negative.    Palpation comment TTP of the L lumbar parspinals and laterally      Transfers   Transfers Sit to Stand;Stand to Sit    Sit to Stand 7: Independent      Ambulation/Gait   Ambulation/Gait Yes    Gait Pattern Step-through pattern;Left steppage   L drop foot                     Objective measurements completed on examination: See above findings.               PT Education - 02/27/20 1715    Education Details Eval findings, POC, HEP    Person(s) Educated Patient    Methods Explanation;Demonstration;Tactile cues;Verbal cues;Handout    Comprehension Verbalized understanding;Returned demonstration;Verbal cues required;Tactile cues required;Need further instruction            PT Short Term Goals - 02/27/20 2159      PT SHORT TERM GOAL #1   Title Pt  will be independent with initial HEP    Baseline Started on eval    Status New    Target Date 03/19/20      PT SHORT TERM GOAL #2   Title Pt will voice undestanding of measures to assist with the reduction of her L low back pain.  Status New    Target Date 03/19/20             PT Long Term Goals - 02/27/20 2203      PT LONG TERM GOAL #1   Title Pt will report a consistent reduction in low back pain to 3/10 or less with daily activities and walking for periods of 30 mins.    Period Weeks    Status New    Target Date 04/18/20      PT LONG TERM GOAL #2   Title Return demonstration of proper body mechanics for ADLs to reduce lumbar pain/strain    Status New    Target Date 04/18/20      PT LONG TERM GOAL #3   Title Pt will be Ind in a final HEP to maintain or progress achieved LOF    Status New    Target Date 04/18/20                  Plan - 02/27/20 2123    Clinical Impression Statement Pt presents to PT with a persistent pinching sensation of her L low back. Pt reports the pain is consistently at a 4-5/10 level and it bothers her the most with prolonged standing and walking. Ther ex to assist with lumbar forward flexion and R side bending initially appeared to help minimize the pt's pain level. A HEP was provided and pt returned demonstration. Will also complete a trial c a AFO for the pt's L drop foot to determine if this repetitive motion may be contributing to pt's pain level. Pt will benefit from skilled PT for flexibility, strengthening, back care education, modalities and manual care to reduce pain and improve functional mobility.    Personal Factors and Comorbidities Comorbidity 1;Comorbidity 3+;Comorbidity 2    Comorbidities Procedure: Lumbar Three-Four Lumbar Four-Five Lumbar Five Sacral One Anterior lumbar interbody fusion; DDD, scoliosis, obesity, DM, anxiety, depression, SOB    Examination-Activity Limitations Locomotion Level;Stand;Transfers     Examination-Participation Restrictions Personal Finances    Stability/Clinical Decision Making Evolving/Moderate complexity    Clinical Decision Making Moderate    Rehab Potential Good    PT Frequency 2x / week    PT Duration 6 weeks    PT Treatment/Interventions ADLs/Self Care Home Management;Cryotherapy;Electrical Stimulation;Ultrasound;Moist Heat;Iontophoresis 4mg /ml Dexamethasone;Gait training;Stair training;Functional mobility training;Therapeutic activities;Therapeutic exercise;Balance training;Manual techniques;Patient/family education;Passive range of motion;Dry needling;Taping    PT Next Visit Plan Assess respnse to HEP. Complete trail with AFO for L drop foot.    PT Home Exercise Plan [id number]    Consulted and Agree with Plan of Care Patient           Patient will benefit from skilled therapeutic intervention in order to improve the following deficits and impairments:  Abnormal gait,Difficulty walking,Obesity,Decreased activity tolerance,Increased muscle spasms,Pain,Decreased strength,Postural dysfunction  Visit Diagnosis: Other intervertebral disc degeneration, lumbar region  Muscle spasm of back  Other abnormalities of gait and mobility  Foot drop, left  Chronic left-sided low back pain without sciatica     Problem List Patient Active Problem List   Diagnosis Date Noted  . Adjustment disorder with mixed disturbance of emotions and conduct 08/17/2019  . Suicidal ideations 08/17/2019  . Lumbar radiculopathy, chronic 07/08/2019  . Degenerative disc disease at L5-S1 level 03/25/2019  . Ventral incisional hernia 03/12/2019  . Type 2 diabetes mellitus with hyperglycemia (HCC) 03/07/2019  . Chronic kidney disease (CKD), stage III (moderate) (HCC) 02/14/2019  . Mixed hyperlipidemia 02/11/2019  . MDD (major depressive disorder) 02/11/2019  .  Foot drop, left foot 12/15/2016  . Syncope 04/18/2016  . Chronic pain 04/18/2016  . Recurrent dislocation of right shoulder  09/09/2014  . BREAST MASS, LEFT 09/26/2007  . RHINITIS, ALLERGIC NOS 10/26/2006  . Graves' disease 10/24/2006  . Hypothyroidism 10/24/2006  . Hypertension 10/24/2006  . HX, PERSONAL, CERVICAL DYSPLASIA 10/24/2006   Gar Ponto MS, PT 02/27/20 10:14 PM  [facility] [address] [place], [place], [address] Phone: [phone]   Fax:  [phone]  Name: Karen Nixon MRN: [id number] Date of Birth: Jul 31, 1970   Check all possible CPT codes: D000499- Therapeutic Exercise, [phone] - Gait Training, [id number] - Manual Therapy, [id number] - Therapeutic Activities, [phone] - [place], [id number] - Electrical stimulation (unattended), [id number] - Electrical stimulation (Manual), [id number] - Iontophoresis and [id number] - Ultrasound

## 2020-03-11 ENCOUNTER — Other Ambulatory Visit: Payer: Self-pay

## 2020-03-11 ENCOUNTER — Encounter: Payer: Self-pay | Admitting: Rehabilitative and Restorative Service Providers"

## 2020-03-11 ENCOUNTER — Ambulatory Visit: Payer: Medicaid Other | Attending: Neurological Surgery | Admitting: Rehabilitative and Restorative Service Providers"

## 2020-03-11 DIAGNOSIS — R2689 Other abnormalities of gait and mobility: Secondary | ICD-10-CM | POA: Diagnosis present

## 2020-03-11 DIAGNOSIS — M5442 Lumbago with sciatica, left side: Secondary | ICD-10-CM | POA: Insufficient documentation

## 2020-03-11 DIAGNOSIS — M5136 Other intervertebral disc degeneration, lumbar region: Secondary | ICD-10-CM | POA: Insufficient documentation

## 2020-03-11 DIAGNOSIS — M21372 Foot drop, left foot: Secondary | ICD-10-CM | POA: Insufficient documentation

## 2020-03-11 DIAGNOSIS — M545 Low back pain, unspecified: Secondary | ICD-10-CM

## 2020-03-11 DIAGNOSIS — R262 Difficulty in walking, not elsewhere classified: Secondary | ICD-10-CM | POA: Diagnosis present

## 2020-03-11 DIAGNOSIS — R293 Abnormal posture: Secondary | ICD-10-CM | POA: Insufficient documentation

## 2020-03-11 DIAGNOSIS — M62830 Muscle spasm of back: Secondary | ICD-10-CM | POA: Diagnosis present

## 2020-03-11 DIAGNOSIS — M5441 Lumbago with sciatica, right side: Secondary | ICD-10-CM | POA: Diagnosis present

## 2020-03-11 DIAGNOSIS — G8929 Other chronic pain: Secondary | ICD-10-CM

## 2020-03-11 DIAGNOSIS — M6281 Muscle weakness (generalized): Secondary | ICD-10-CM | POA: Diagnosis present

## 2020-03-11 NOTE — Therapy (Signed)
[place], [place], [address] Phone: [phone]   Fax:  [phone]  Physical Therapy Treatment  Patient Details  Name: Karen Nixon MRN: [id number] Date of Birth: 10-04-1970 Referring Provider (PT): [name], MD   Encounter Date: 03/11/2020   PT End of Session - 03/11/20 1539    Visit Number 2    PT Start Time 0341    PT Stop Time 0429    PT Time Calculation (min) 48 min    Activity Tolerance Patient tolerated treatment well;No increased pain    Behavior During Therapy WFL for tasks assessed/performed           Past Medical History:  Diagnosis Date  . Anxiety   . DDD (degenerative disc disease), lumbar   . Degenerative disc disease, lumbar   . Depression    due to chronic pain  . Diabetes mellitus without complication ([place])   . Dislocation of shoulder    x 9  . GERD (gastroesophageal reflux disease)   . Glaucoma   . Hyperlipemia   . Hypothyroidism   . Left facial numbness 2010   and tingling  and left arm- years ago.  Dr says it was stress  . Scoliosis   . Shortness of breath dyspnea    with exertation  . Thyroid disease   . Umbilical hernia     Past Surgical History:  Procedure Laterality Date  . ABDOMINAL EXPOSURE N/A 07/08/2019   Procedure: ABDOMINAL EXPOSURE;  Surgeon: Rosetta Posner, MD;  [facility];  Service: Vascular;  Laterality: N/A;  . ANTERIOR LUMBAR FUSION N/A 07/08/2019   Procedure: Lumbar Three-Four Lumbar Four-Five Lumbar Five Sacral One Anterior lumbar interbody fusion;  Surgeon: [name], MD;  Location: [place];  Service: Neurosurgery;  Laterality: N/A;  Lumbar Three-Four Lumbar Four-Five Lumbar Five Sacral One Anterior lumbar interbody fusion  . APPLICATION OF ROBOTIC ASSISTANCE FOR SPINAL PROCEDURE N/A 07/08/2019   Procedure: APPLICATION OF ROBOTIC ASSISTANCE FOR SPINAL PROCEDURE;  Surgeon: [name], MD;  Location: [place];  Service: Neurosurgery;   Laterality: N/A;  . BACK SURGERY  2005  . CERVICAL CONIZATION W/BX    . ELBOW SURGERY Right 2007   fracture repair with screws  . INDUCED ABORTION    . LUMBAR PERCUTANEOUS PEDICLE SCREW 3 LEVEL N/A 07/08/2019   Procedure: Posterior percutaneous pedicle screw fixation Lumbar Three to Sacral One;  Surgeon: [name], MD;  Location: [place];  Service: Neurosurgery;  Laterality: N/A;  Posterior percutaneous pedicle screw fixation Lumbar Three to Sacral One  . SHOULDER ARTHROSCOPY Right 09/09/2014   Procedure: RIGHT SHOULDER ARTHROSCOPY WITH LABRAL REPAIR;  Surgeon: Mcarthur Rossetti, MD;  Location: [place];  Service: Orthopedics;  Laterality: Right;  . SHOULDER SURGERY Right 2001   arthroscopy  . TUBAL LIGATION  11/20/1996    There were no vitals filed for this visit.   Subjective Assessment - 03/11/20 1539    Subjective I can tell when the weather is about to be bad. About a 4-5/10 pain on left side of back where I had the surgery.    Currently in Pain? Yes    Pain Score 5     Pain Location Back    Pain Orientation Left    Pain Descriptors / Indicators Aching    Pain Type Chronic pain    Pain Frequency Constant    Multiple Pain Sites No  [facility] Adult PT Treatment/Exercise - 03/11/20 0001      Exercises   Exercises Lumbar      Lumbar Exercises: Supine   Other Supine Lumbar Exercises pelvic tilt with moderate verbal and tactile cues for technique x 10 with 3-5 sec holds; tilt with march x 15, knee to chest 2x30 sec each side,lower trunk rot x 5 with 3-5 sec hold; tilt with heel slide x 15; tilt with ball squeeze x 20; tilt with ball squeeze with bridge x 10      Prosthetics   Prosthetic Care Comments  Did a trial of a L LE AFO with pt with improved foot clearance and decreased antalgic gait. PT discussed with pt comorbidities to see if she would qualify for an AFO with her insurance.                  PT Education - 03/11/20  1619    Education Details reviewed HEP    Person(s) Educated Patient    Methods Explanation;Demonstration    Comprehension Verbalized understanding;Returned demonstration            PT Short Term Goals - 03/11/20 1606      PT SHORT TERM GOAL #1   Title Pt will be independent with initial HEP    Status On-going      PT SHORT TERM GOAL #2   Title Pt will voice undestanding of measures to assist with the reduction of her L low back pain.    Status On-going      PT SHORT TERM GOAL #3   Title Pt will be able to lie on Right side to sleelp for 4 or more uninterrupted hours of sleep a night    Status On-going      PT SHORT TERM GOAL #4   Title "Demonstrate and verbalize understanding of condition management including RICE, positioning, HEP.     Status On-going             PT Long Term Goals - 02/27/20 2203      PT LONG TERM GOAL #1   Title Pt will report a consistent reduction in low back pain to 3/10 or less with daily activities and walking for periods of 30 mins.    Period Weeks    Status New    Target Date 04/18/20      PT LONG TERM GOAL #2   Title Return demonstration of proper body mechanics for ADLs to reduce lumbar pain/strain    Status New    Target Date 04/18/20      PT LONG TERM GOAL #3   Title Pt will be Ind in a final HEP to maintain or progress achieved LOF    Status New    Target Date 04/18/20                 Plan - 03/11/20 1601    Clinical Impression Statement Pt did trial of L AFO with improvememt in foot clearance; no change in LBP but she states it is probably b/c of the weather. Prior lumbar fusion.  Pt will benefit from skilled PT for flexibility, lumbar/core strengthening, back care education, modalities and manual care to reduce pain and improve functional mobility.    PT Treatment/Interventions ADLs/Self Care Home Management;Cryotherapy;Electrical Stimulation;Ultrasound;Moist Heat;Iontophoresis 4mg /ml Dexamethasone;Gait training;Stair  training;Functional mobility training;Therapeutic activities;Therapeutic exercise;Balance training;Manual techniques;Patient/family education;Passive range of motion;Dry needling;Taping    PT Next Visit Plan continue trial of L foot AFO and explore options to get one;  progress lumbar/core strengthening and flexibility    Consulted and Agree with Plan of Care Patient           Patient will benefit from skilled therapeutic intervention in order to improve the following deficits and impairments:  Abnormal gait,Difficulty walking,Obesity,Decreased activity tolerance,Increased muscle spasms,Pain,Decreased strength,Postural dysfunction  Visit Diagnosis: Degeneration of lumbar intervertebral disc  Other intervertebral disc degeneration, lumbar region  Muscle spasm of back  Chronic left-sided low back pain with bilateral sciatica  Muscle weakness (generalized)  Other abnormalities of gait and mobility  Foot drop, left  Difficulty in walking, not elsewhere classified  Abnormal posture  Chronic left-sided low back pain without sciatica     Problem List Patient Active Problem List   Diagnosis Date Noted  . Adjustment disorder with mixed disturbance of emotions and conduct 08/17/2019  . Suicidal ideations 08/17/2019  . Lumbar radiculopathy, chronic 07/08/2019  . Degenerative disc disease at L5-S1 level 03/25/2019  . Ventral incisional hernia 03/12/2019  . Type 2 diabetes mellitus with hyperglycemia ([place]) 03/07/2019  . Chronic kidney disease (CKD), stage III (moderate) ([place]) 02/14/2019  . Mixed hyperlipidemia 02/11/2019  . MDD (major depressive disorder) 02/11/2019  . Foot drop, left foot 12/15/2016  . Syncope 04/18/2016  . Chronic pain 04/18/2016  . 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 BREAST MASS, LEFT 09/26/2007  . RHINITIS, ALLERGIC NOS 10/26/2006  . Graves' disease 10/24/2006  . Hypothyroidism 10/24/2006  . Hypertension 10/24/2006  . HX, PERSONAL,  CERVICAL DYSPLASIA 10/24/2006    Myra Rude, PT 03/11/2020, 4:30 PM  [facility] [address] [place], [place], [address] Phone: [phone]   Fax:  [phone]  Name: Karen Nixon MRN: [id number] Date of Birth: 1970-07-31

## 2020-03-18 ENCOUNTER — Ambulatory Visit: Payer: Medicaid Other | Admitting: Physical Therapy

## 2020-03-18 ENCOUNTER — Other Ambulatory Visit: Payer: Self-pay

## 2020-03-18 ENCOUNTER — Encounter: Payer: Self-pay | Admitting: Physical Therapy

## 2020-03-18 DIAGNOSIS — M545 Low back pain, unspecified: Secondary | ICD-10-CM

## 2020-03-18 DIAGNOSIS — G8929 Other chronic pain: Secondary | ICD-10-CM

## 2020-03-18 DIAGNOSIS — M62830 Muscle spasm of back: Secondary | ICD-10-CM

## 2020-03-18 DIAGNOSIS — M5136 Other intervertebral disc degeneration, lumbar region: Secondary | ICD-10-CM

## 2020-03-18 DIAGNOSIS — R2689 Other abnormalities of gait and mobility: Secondary | ICD-10-CM

## 2020-03-18 DIAGNOSIS — M6281 Muscle weakness (generalized): Secondary | ICD-10-CM

## 2020-03-18 DIAGNOSIS — R262 Difficulty in walking, not elsewhere classified: Secondary | ICD-10-CM

## 2020-03-18 DIAGNOSIS — R293 Abnormal posture: Secondary | ICD-10-CM

## 2020-03-18 DIAGNOSIS — M5441 Lumbago with sciatica, right side: Secondary | ICD-10-CM

## 2020-03-18 DIAGNOSIS — M21372 Foot drop, left foot: Secondary | ICD-10-CM

## 2020-03-18 DIAGNOSIS — M51369 Other intervertebral disc degeneration, lumbar region without mention of lumbar back pain or lower extremity pain: Secondary | ICD-10-CM

## 2020-03-18 NOTE — Therapy (Signed)
[place], [place], [address] Phone: [phone]   Fax:  [phone]  Physical Therapy Treatment  Patient Details  Name: Karen Nixon MRN: [id number] Date of Birth: October 13, 1970 Referring Provider (PT): [name], MD   Encounter Date: 03/18/2020   PT End of Session - 03/18/20 1411    Visit Number 3    Number of Visits 13    Date for PT Re-Evaluation 04/18/20    Authorization Type MEDICAID [place] ACCESS approved initial 3 treatments    Authorization Time Period 03/11/20-03/31/20, 3 treatments    Authorization - Visit Number 2    Authorization - Number of Visits 3    Progress Note Due on Visit 10    PT Start Time 0210    PT Stop Time 0250    PT Time Calculation (min) 40 min           Past Medical History:  Diagnosis Date  . Anxiety   . DDD (degenerative disc disease), lumbar   . Degenerative disc disease, lumbar   . Depression    due to chronic pain  . Diabetes mellitus without complication ([place])   . Dislocation of shoulder    x 9  . GERD (gastroesophageal reflux disease)   . Glaucoma   . Hyperlipemia   . Hypothyroidism   . Left facial numbness 2010   and tingling  and left arm- years ago.  Dr says it was stress  . Scoliosis   . Shortness of breath dyspnea    with exertation  . Thyroid disease   . Umbilical hernia     Past Surgical History:  Procedure Laterality Date  . ABDOMINAL EXPOSURE N/A 07/08/2019   Procedure: ABDOMINAL EXPOSURE;  Surgeon: Rosetta Posner, MD;  [facility];  Service: Vascular;  Laterality: N/A;  . ANTERIOR LUMBAR FUSION N/A 07/08/2019   Procedure: Lumbar Three-Four Lumbar Four-Five Lumbar Five Sacral One Anterior lumbar interbody fusion;  Surgeon: [name], MD;  Location: [place];  Service: Neurosurgery;  Laterality: N/A;  Lumbar Three-Four Lumbar Four-Five Lumbar Five Sacral One Anterior lumbar interbody fusion  . APPLICATION OF ROBOTIC ASSISTANCE FOR  SPINAL PROCEDURE N/A 07/08/2019   Procedure: APPLICATION OF ROBOTIC ASSISTANCE FOR SPINAL PROCEDURE;  Surgeon: [name], MD;  Location: [facility];  Service: Neurosurgery;  Laterality: N/A;  . BACK SURGERY  2005  . CERVICAL CONIZATION W/BX    . ELBOW SURGERY Right 2007   fracture repair with screws  . INDUCED ABORTION    . LUMBAR PERCUTANEOUS PEDICLE SCREW 3 LEVEL N/A 07/08/2019   Procedure: Posterior percutaneous pedicle screw fixation Lumbar Three to Sacral One;  Surgeon: [name], MD;  Location: [place];  Service: Neurosurgery;  Laterality: N/A;  Posterior percutaneous pedicle screw fixation Lumbar Three to Sacral One  . SHOULDER ARTHROSCOPY Right 09/09/2014   Procedure: RIGHT SHOULDER ARTHROSCOPY WITH LABRAL REPAIR;  Surgeon: Mcarthur Rossetti, MD;  Location: [facility] [place];  Service: Orthopedics;  Laterality: Right;  . SHOULDER SURGERY Right 2001   arthroscopy  . TUBAL LIGATION  11/20/1996    There were no vitals filed for this visit.   Subjective Assessment - 03/18/20 1408    Subjective The pain is better now that the weather build up is over.    Currently in Pain? Yes    Pain Score 3     Pain Location Back    Pain Orientation Left    Pain Descriptors / Indicators Aching    Pain  Type Chronic pain                             [facility] Adult PT Treatment/Exercise - 03/18/20 0001      Lumbar Exercises: Stretches   Single Knee to Chest Stretch 2 reps;20 seconds    Lower Trunk Rotation Limitations 10 reps , 10 sec each    Standing Side Bend Limitations reviewed initial side bend stretch-used doorway to assist with balance and technique 10 sec x 3 right    Other Lumbar Stretch Exercise seated lumbar flexion stretch -cues to use UE to return from stretch      Lumbar Exercises: Aerobic   Nustep L5 UE/LE x 5 minutes      Lumbar Exercises: Seated   Sit to Stand 10 reps    Sit to Stand Limitations 2 sets, cues for alignment      Lumbar Exercises: Supine   Pelvic Tilt  20 reps    Pelvic Tilt Limitations min cues , 5 sec hold    Clam Limitations PPT with Clam 5 x 4    Bent Knee Raise Limitations PPT with march 10 x 2    Bridge 10 reps    Bridge Limitations with PPT    Bridge with [employer]sonBall Squeeze 10 reps    Bridge with [employer]-DavidsonBall Squeeze Limitations with PPT                  PT Education - 03/18/20 1445    Education Details addition to [employer]    Person(s) Educated Patient    Methods Explanation;Handout    Comprehension Verbalized understanding            PT Short Term Goals - 03/11/20 1606      PT SHORT TERM GOAL #1   Title Pt will be independent with initial HEP    Status On-going      PT SHORT TERM GOAL #2   Title Pt will voice undestanding of measures to assist with the reduction of her L low back pain.    Status On-going      PT SHORT TERM GOAL #3   Title Pt will be able to lie on Right side to sleelp for 4 or more uninterrupted hours of sleep a night    Status On-going      PT SHORT TERM GOAL #4   Title "Demonstrate and verbalize understanding of condition management including RICE, positioning, HEP.     Status On-going             PT Long Term Goals - 02/27/20 2203      PT LONG TERM GOAL #1   Title Pt will report a consistent reduction in low back pain to 3/10 or less with daily activities and walking for periods of 30 mins.    Period Weeks    Status New    Target Date 04/18/20      PT LONG TERM GOAL #2   Title Return demonstration of proper body mechanics for ADLs to reduce lumbar pain/strain    Status New    Target Date 04/18/20      PT LONG TERM GOAL #3   Title Pt will be Ind in a final HEP to maintain or progress achieved LOF    Status New    Target Date 04/18/20                 Plan - 03/18/20 1427  Clinical Impression Statement Recommended pt request AFO order from MD. Pt plans to call to request. Reviewed HEP and updated HEP with hip hinge and core/hip strength.    Personal Factors and Comorbidities  Comorbidity 1;Comorbidity 3+;Comorbidity 2    Comorbidities Procedure: Lumbar Three-Four Lumbar Four-Five Lumbar Five Sacral One Anterior lumbar interbody fusion; DDD, scoliosis, obesity, DM, anxiety, depression, SOB    PT Next Visit Plan continue trial of L foot AFO and explore options to get one; progress lumbar/core strengthening and flexibility    PT Home Exercise Plan [id number](initial seated lumbar flexion and standing ITB stretch), 03/18/20- added PPT with march , sit-stand, bridge with ball squeze, LTR    Consulted and Agree with Plan of Care Patient           Patient will benefit from skilled therapeutic intervention in order to improve the following deficits and impairments:  Abnormal gait,Difficulty walking,Obesity,Decreased activity tolerance,Increased muscle spasms,Pain,Decreased strength,Postural dysfunction  Visit Diagnosis: Degeneration of lumbar intervertebral disc  Other intervertebral disc degeneration, lumbar region  Muscle spasm of back  Muscle weakness (generalized)  Chronic left-sided low back pain with bilateral sciatica  Other abnormalities of gait and mobility  Foot drop, left  Difficulty in walking, not elsewhere classified  Abnormal posture  Chronic left-sided low back pain without sciatica     Problem List Patient Active Problem List   Diagnosis Date Noted  . Adjustment disorder with mixed disturbance of emotions and conduct 08/17/2019  . Suicidal ideations 08/17/2019  . Lumbar radiculopathy, chronic 07/08/2019  . Degenerative disc disease at L5-S1 level 03/25/2019  . Ventral incisional hernia 03/12/2019  . Type 2 diabetes mellitus with hyperglycemia ([place]) 03/07/2019  . Chronic kidney disease (CKD), stage III (moderate) ([place]) 02/14/2019  . Mixed hyperlipidemia 02/11/2019  . MDD (major depressive disorder) 02/11/2019  . Foot drop, left foot 12/15/2016  . Syncope 04/18/2016  . Chronic pain 04/18/2016  . Recurrent dislocation of right  shoulder 09/09/2014  . BREAST MASS, LEFT 09/26/2007  . RHINITIS, ALLERGIC NOS 10/26/2006  . Graves' disease 10/24/2006  . Hypothyroidism 10/24/2006  . Hypertension 10/24/2006  . HX, PERSONAL, CERVICAL DYSPLASIA 10/24/2006    Dorene Ar, [place] 03/18/2020, 2:59 PM  [facility] [address] [place], [place], [address] Phone: [phone]   Fax:  [phone]  Name: Karen Nixon MRN: [id number] Date of Birth: 12/07/1970

## 2020-03-18 NOTE — Patient Instructions (Signed)
Access Code: [email or web address] URL: [email or web address] Date: 03/18/2020 Prepared by: Hessie Diener  Exercises Standing ITB Stretch - 3-5 x daily - 7 x weekly - 1 sets - 3 reps - 15 hold Seated Flexion Stretch - 3-5 x daily - 7 x weekly - 1 sets - 3 reps - 15 hold Sit to Stand - 2 x daily - 7 x weekly - 2 sets - 10 reps Supine March with Posterior Pelvic Tilt - 2 x daily - 7 x weekly - 2 sets - 10 reps Supine Bridge with Mini Swiss Ball Between Knees - 2 x daily - 7 x weekly - 2 sets - 10 reps Supine Lower Trunk Rotation - 2 x daily - 7 x weekly - 1 sets - 10 reps - 5-10 hold

## 2020-03-20 ENCOUNTER — Other Ambulatory Visit: Payer: Self-pay

## 2020-03-20 ENCOUNTER — Ambulatory Visit: Payer: Medicaid Other

## 2020-03-20 DIAGNOSIS — M5441 Lumbago with sciatica, right side: Secondary | ICD-10-CM

## 2020-03-20 DIAGNOSIS — R262 Difficulty in walking, not elsewhere classified: Secondary | ICD-10-CM

## 2020-03-20 DIAGNOSIS — M6281 Muscle weakness (generalized): Secondary | ICD-10-CM

## 2020-03-20 DIAGNOSIS — M545 Low back pain, unspecified: Secondary | ICD-10-CM

## 2020-03-20 DIAGNOSIS — M51369 Other intervertebral disc degeneration, lumbar region without mention of lumbar back pain or lower extremity pain: Secondary | ICD-10-CM

## 2020-03-20 DIAGNOSIS — R2689 Other abnormalities of gait and mobility: Secondary | ICD-10-CM

## 2020-03-20 DIAGNOSIS — G8929 Other chronic pain: Secondary | ICD-10-CM

## 2020-03-20 DIAGNOSIS — M62830 Muscle spasm of back: Secondary | ICD-10-CM

## 2020-03-20 DIAGNOSIS — M21372 Foot drop, left foot: Secondary | ICD-10-CM

## 2020-03-20 DIAGNOSIS — M5136 Other intervertebral disc degeneration, lumbar region: Secondary | ICD-10-CM | POA: Diagnosis not present

## 2020-03-20 DIAGNOSIS — R293 Abnormal posture: Secondary | ICD-10-CM

## 2020-03-20 NOTE — Therapy (Addendum)
[place], [place], [address] Phone: [phone]   Fax:  [phone]  Physical Therapy Treatment  Patient Details  Name: Karen Nixon MRN: [id number] Date of Birth: 05/01/1970 Referring Provider (PT): [name], MD   Encounter Date: 03/20/2020   PT End of Session - 03/20/20 1304    Visit Number 4    Number of Visits 13    Date for PT Re-Evaluation 04/18/20    Authorization Type MEDICAID [place] ACCESS approved initial 3 treatments    Authorization Time Period 03/11/20-03/31/20, 3 treatments    Authorization - Visit Number 3    Authorization - Number of Visits 3    Progress Note Due on Visit 10    PT Start Time [id number]    PT Stop Time [id number]    PT Time Calculation (min) 45 min    Activity Tolerance Patient tolerated treatment well;No increased pain    Behavior During Therapy WFL for tasks assessed/performed           Past Medical History:  Diagnosis Date  . Anxiety   . DDD (degenerative disc disease), lumbar   . Degenerative disc disease, lumbar   . Depression    due to chronic pain  . Diabetes mellitus without complication ([place])   . Dislocation of shoulder    x 9  . GERD (gastroesophageal reflux disease)   . Glaucoma   . Hyperlipemia   . Hypothyroidism   . Left facial numbness 2010   and tingling  and left arm- years ago.  Dr says it was stress  . Scoliosis   . Shortness of breath dyspnea    with exertation  . Thyroid disease   . Umbilical hernia     Past Surgical History:  Procedure Laterality Date  . ABDOMINAL EXPOSURE N/A 07/08/2019   Procedure: ABDOMINAL EXPOSURE;  Surgeon: Rosetta Posner, MD;  [facility];  Service: Vascular;  Laterality: N/A;  . ANTERIOR LUMBAR FUSION N/A 07/08/2019   Procedure: Lumbar Three-Four Lumbar Four-Five Lumbar Five Sacral One Anterior lumbar interbody fusion;  Surgeon: [name], MD;  Location: [facility];  Service: Neurosurgery;  Laterality: N/A;   Lumbar Three-Four Lumbar Four-Five Lumbar Five Sacral One Anterior lumbar interbody fusion  . APPLICATION OF ROBOTIC ASSISTANCE FOR SPINAL PROCEDURE N/A 07/08/2019   Procedure: APPLICATION OF ROBOTIC ASSISTANCE FOR SPINAL PROCEDURE;  Surgeon: [name], MD;  Location: [facility];  Service: Neurosurgery;  Laterality: N/A;  . BACK SURGERY  2005  . CERVICAL CONIZATION W/BX    . ELBOW SURGERY Right 2007   fracture repair with screws  . INDUCED ABORTION    . LUMBAR PERCUTANEOUS PEDICLE SCREW 3 LEVEL N/A 07/08/2019   Procedure: Posterior percutaneous pedicle screw fixation Lumbar Three to Sacral One;  Surgeon: [name], MD;  Location: [place];  Service: Neurosurgery;  Laterality: N/A;  Posterior percutaneous pedicle screw fixation Lumbar Three to Sacral One  . SHOULDER ARTHROSCOPY Right 09/09/2014   Procedure: RIGHT SHOULDER ARTHROSCOPY WITH LABRAL REPAIR;  Surgeon: Mcarthur Rossetti, MD;  Location: [place];  Service: Orthopedics;  Laterality: Right;  . SHOULDER SURGERY Right 2001   arthroscopy  . TUBAL LIGATION  11/20/1996    There were no vitals filed for this visit.   Subjective Assessment - 03/20/20 1312    Subjective Pt reports she is sore today after walking at the gym, but her back is feeling better.    Patient Stated Goals To minimize the pinching sensation with  her L low back    Currently in Pain? Yes    Pain Score 0-No pain    Pain Location Back    Pain Orientation Left    Pain Descriptors / Indicators Aching    Pain Type Chronic pain    Pain Onset More than a month ago    Pain Frequency Intermittent                             [facility] Adult PT Treatment/Exercise - 03/20/20 0001      Lumbar Exercises: Stretches   ITB Stretch Left;3 reps;10 seconds    ITB Stretch Limitations standing    Piriformis Stretch Left;3 reps;10 seconds    Other Lumbar Stretch Exercise seated lumbar flexion forward and to the R stretches -cues to use UE to return from stretch. 3x  each, 10 sec      Lumbar Exercises: Aerobic   Nustep L5 UE/LE x 8 minutes      Lumbar Exercises: Seated   Sit to Stand 10 reps    Sit to Stand Limitations 2 sets, cues for alignment      Lumbar Exercises: Supine   Pelvic Tilt 10 reps    Pelvic Tilt Limitations 5 sec    Clam Limitations PPT with Clam 10x c Red Tband    Bent Knee Raise Limitations PPT with march 10 x 2    Bridge 10 reps    Bridge Limitations with PPT    Bridge with [employer] 10 reps    Bridge with [employer] Limitations with PPT                    PT Short Term Goals - 03/11/20 1606      PT SHORT TERM GOAL #1   Title Pt will be independent with initial HEP    Status On-going      PT SHORT TERM GOAL #2   Title Pt will voice undestanding of measures to assist with the reduction of her L low back pain.    Status On-going      PT SHORT TERM GOAL #3   Title Pt will be able to lie on Right side to sleelp for 4 or more uninterrupted hours of sleep a night    Status On-going      PT SHORT TERM GOAL #4   Title "Demonstrate and verbalize understanding of condition management including RICE, positioning, HEP.     Status On-going             PT Long Term Goals - 02/27/20 2203      PT LONG TERM GOAL #1   Title Pt will report a consistent reduction in low back pain to 3/10 or less with daily activities and walking for periods of 30 mins.    Period Weeks    Status New    Target Date 04/18/20      PT LONG TERM GOAL #2   Title Return demonstration of proper body mechanics for ADLs to reduce lumbar pain/strain    Status New    Target Date 04/18/20      PT LONG TERM GOAL #3   Title Pt will be Ind in a final HEP to maintain or progress achieved LOF    Status New    Target Date 04/18/20                 Plan - 03/20/20 1348  Clinical Impression Statement Pt reports she has contacted Dr. Wallene Huh office re: an AFO being ordered. Pt was also provided lower cost options for a DF brace  until she is able to receive an AFO. Pt has been responding favorably to PT with her pain level being decreased and intermittent. PT was completed lumbopelvic/LE flexibility and strengthening/stability with emphasis on abdominal strengthening. With the ther ex, pt experinced a L quad cramp which was relieved by pt administering massage to the area. Pt will benefit from continued PT to reduce pain and optimize functional mobility.    Comorbidities Procedure: Lumbar Three-Four Lumbar Four-Five Lumbar Five Sacral One Anterior lumbar interbody fusion; DDD, scoliosis, obesity, DM, anxiety, depression, SOB    Examination-Activity Limitations Locomotion Level;Stand;Transfers    Examination-Participation Restrictions Personal Finances    Stability/Clinical Decision Making Evolving/Moderate complexity    Clinical Decision Making Moderate    Rehab Potential Good    PT Frequency 2x / week    PT Duration 6 weeks    PT Treatment/Interventions ADLs/Self Care Home Management;Cryotherapy;Electrical Stimulation;Ultrasound;Moist Heat;Iontophoresis 4mg /ml Dexamethasone;Gait training;Stair training;Functional mobility training;Therapeutic activities;Therapeutic exercise;Balance training;Manual techniques;Patient/family education;Passive range of motion;Dry needling;Taping    PT Next Visit Plan Continue progression of lumbopelvic exs    PT Home Exercise Plan [id number](initial seated lumbar flexion and standing ITB stretch), 03/18/20- added PPT with march , sit-stand, bridge with ball squeze, LTR           Patient will benefit from skilled therapeutic intervention in order to improve the following deficits and impairments:  Abnormal gait,Difficulty walking,Obesity,Decreased activity tolerance,Increased muscle spasms,Pain,Decreased strength,Postural dysfunction  Visit Diagnosis: Degeneration of lumbar intervertebral disc  Other intervertebral disc degeneration, lumbar region  Muscle spasm of back  Muscle weakness  (generalized)  Chronic left-sided low back pain with bilateral sciatica  Other abnormalities of gait and mobility  Foot drop, left  Difficulty in walking, not elsewhere classified  Abnormal posture  Chronic left-sided low back pain without sciatica     Problem List Patient Active Problem List   Diagnosis Date Noted  . Adjustment disorder with mixed disturbance of emotions and conduct 08/17/2019  . Suicidal ideations 08/17/2019  . Lumbar radiculopathy, chronic 07/08/2019  . Degenerative disc disease at L5-S1 level 03/25/2019  . Ventral incisional hernia 03/12/2019  . Type 2 diabetes mellitus with hyperglycemia ([place]) 03/07/2019  . Chronic kidney disease (CKD), stage III (moderate) ([place]) 02/14/2019  . Mixed hyperlipidemia 02/11/2019  . MDD (major depressive disorder) 02/11/2019  . Foot drop, left foot 12/15/2016  . Syncope 04/18/2016  . Chronic pain 04/18/2016  . 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 BREAST MASS, LEFT 09/26/2007  . RHINITIS, ALLERGIC NOS 10/26/2006  . Graves' disease 10/24/2006  . Hypothyroidism 10/24/2006  . Hypertension 10/24/2006  . HX, PERSONAL, CERVICAL DYSPLASIA 10/24/2006    Gar Ponto MS, PT 03/20/20 11:20 PM  [facility] [address] [place], [place], [address] Phone: [phone]   Fax:  [phone]  Name: Karen Nixon MRN: [id number] Date of Birth: 04-Mar-1970   Check all possible CPT codes: 72536- Therapeutic Exercise, [phone] - Gait Training, [phone] - Manual Therapy, 95638 - Therapeutic Activities, [phone] - [place], [address] - Electrical stimulation (unattended), [id number] - Electrical stimulation (Manual), [id number] - Iontophoresis, [id number] - Ultrasound and [id number] - Vaso

## 2020-03-24 ENCOUNTER — Ambulatory Visit: Payer: Medicaid Other

## 2020-03-24 ENCOUNTER — Other Ambulatory Visit: Payer: Self-pay

## 2020-03-24 DIAGNOSIS — M5441 Lumbago with sciatica, right side: Secondary | ICD-10-CM

## 2020-03-24 DIAGNOSIS — M62830 Muscle spasm of back: Secondary | ICD-10-CM

## 2020-03-24 DIAGNOSIS — G8929 Other chronic pain: Secondary | ICD-10-CM

## 2020-03-24 DIAGNOSIS — M6281 Muscle weakness (generalized): Secondary | ICD-10-CM

## 2020-03-24 DIAGNOSIS — R262 Difficulty in walking, not elsewhere classified: Secondary | ICD-10-CM

## 2020-03-24 DIAGNOSIS — M5136 Other intervertebral disc degeneration, lumbar region: Secondary | ICD-10-CM

## 2020-03-24 DIAGNOSIS — R293 Abnormal posture: Secondary | ICD-10-CM

## 2020-03-24 DIAGNOSIS — M51369 Other intervertebral disc degeneration, lumbar region without mention of lumbar back pain or lower extremity pain: Secondary | ICD-10-CM

## 2020-03-24 DIAGNOSIS — R2689 Other abnormalities of gait and mobility: Secondary | ICD-10-CM

## 2020-03-24 DIAGNOSIS — M21372 Foot drop, left foot: Secondary | ICD-10-CM

## 2020-03-24 NOTE — Therapy (Signed)
[place], [place], [address] Phone: [phone]   Fax:  [phone]  Physical Therapy Treatment  Patient Details  Name: Karen Nixon MRN: [id number] Date of Birth: 1970-03-24 Referring Provider (PT): [name], MD   Encounter Date: 03/24/2020   PT End of Session - 03/24/20 1500    Visit Number 5    Number of Visits 13    Date for PT Re-Evaluation 04/18/20    Authorization Time Period 03/11/20-03/31/20, 3 treatments    Progress Note Due on Visit 10    PT Start Time 1455    PT Stop Time 1533    PT Time Calculation (min) 38 min    Activity Tolerance Patient tolerated treatment well    Behavior During Therapy [facility] for tasks assessed/performed           Past Medical History:  Diagnosis Date  . Anxiety   . DDD (degenerative disc disease), lumbar   . Degenerative disc disease, lumbar   . Depression    due to chronic pain  . Diabetes mellitus without complication ([place])   . Dislocation of shoulder    x 9  . GERD (gastroesophageal reflux disease)   . Glaucoma   . Hyperlipemia   . Hypothyroidism   . Left facial numbness 2010   and tingling  and left arm- years ago.  Dr says it was stress  . Scoliosis   . Shortness of breath dyspnea    with exertation  . Thyroid disease   . Umbilical hernia     Past Surgical History:  Procedure Laterality Date  . ABDOMINAL EXPOSURE N/A 07/08/2019   Procedure: ABDOMINAL EXPOSURE;  Surgeon: Rosetta Posner, MD;  [facility];  Service: Vascular;  Laterality: N/A;  . ANTERIOR LUMBAR FUSION N/A 07/08/2019   Procedure: Lumbar Three-Four Lumbar Four-Five Lumbar Five Sacral One Anterior lumbar interbody fusion;  Surgeon: [name], MD;  Location: [place];  Service: Neurosurgery;  Laterality: N/A;  Lumbar Three-Four Lumbar Four-Five Lumbar Five Sacral One Anterior lumbar interbody fusion  . APPLICATION OF ROBOTIC ASSISTANCE FOR SPINAL PROCEDURE N/A 07/08/2019    Procedure: APPLICATION OF ROBOTIC ASSISTANCE FOR SPINAL PROCEDURE;  Surgeon: [name], MD;  Location: [place];  Service: Neurosurgery;  Laterality: N/A;  . BACK SURGERY  2005  . CERVICAL CONIZATION W/BX    . ELBOW SURGERY Right 2007   fracture repair with screws  . INDUCED ABORTION    . LUMBAR PERCUTANEOUS PEDICLE SCREW 3 LEVEL N/A 07/08/2019   Procedure: Posterior percutaneous pedicle screw fixation Lumbar Three to Sacral One;  Surgeon: [name], MD;  Location: [place];  Service: Neurosurgery;  Laterality: N/A;  Posterior percutaneous pedicle screw fixation Lumbar Three to Sacral One  . SHOULDER ARTHROSCOPY Right 09/09/2014   Procedure: RIGHT SHOULDER ARTHROSCOPY WITH LABRAL REPAIR;  Surgeon: Mcarthur Rossetti, MD;  Location: [place];  Service: Orthopedics;  Laterality: Right;  . SHOULDER SURGERY Right 2001   arthroscopy  . TUBAL LIGATION  11/20/1996    There were no vitals filed for this visit.   Subjective Assessment - 03/24/20 1503    Subjective Pt reports her L low back is not bothering her now. She reports she wakes with it still, but her back will loosen up. Pt reports she has stretched today.    Patient Stated Goals To minimize the pinching sensation with her L low back    Currently in Pain? No/denies    Pain Score 0-No pain  Pain Location Back    Pain Orientation Left;Lower    Pain Descriptors / Indicators Aching    Pain Type Chronic pain    Pain Onset More than a month ago    Pain Frequency Intermittent                             [facility] Adult PT Treatment/Exercise - 03/24/20 0001      Exercises   Exercises Lumbar      Lumbar Exercises: Machines for Strengthening   Cybex Knee Extension 12x; 15 lbs   core engagement   Leg Press 12x; 60lbs   core engaged     Lumbar Exercises: Seated   Sit to Stand --   12 reps   Sit to Stand Limitations 2 sets, cues for alignment      Lumbar Exercises: Supine   Pelvic Tilt --   12 reps   Pelvic Tilt  Limitations 5 sec    Clam Limitations PPT with Clam 12x c Red Tband    Bent Knee Raise Limitations PPT with march 12 x 2    Bridge --   12 reps   Bridge Limitations with PPT    Bridge with [employer] --   12 reps   Bridge with [employer] Limitations with PPT                    PT Short Term Goals - 03/24/20 1840      PT SHORT TERM GOAL #1   Title Pt will be independent with initial HEP    Baseline Started on eval    Status Achieved    Target Date 03/24/20      PT SHORT TERM GOAL #2   Title Pt will voice undestanding of measures to assist with the reduction of her L low back pain.    Status Achieved    Target Date 03/24/20             PT Long Term Goals - 02/27/20 2203      PT LONG TERM GOAL #1   Title Pt will report a consistent reduction in low back pain to 3/10 or less with daily activities and walking for periods of 30 mins.    Period Weeks    Status New    Target Date 04/18/20      PT LONG TERM GOAL #2   Title Return demonstration of proper body mechanics for ADLs to reduce lumbar pain/strain    Status New    Target Date 04/18/20      PT LONG TERM GOAL #3   Title Pt will be Ind in a final HEP to maintain or progress achieved LOF    Status New    Target Date 04/18/20                 Plan - 03/24/20 1830    Clinical Impression Statement Pt reports she has ordered the Foot Up brace. PT was completed for lumbopelvic strengthening/stabiltiy. Pt also completed lower body strengthening with weight machines incorporating core engagement. Pt tolerated the exercises without adverse effects. Pt expressed interest in exercising more with the weight machines, so she can determine if these are exs she can tolerate using at her gym.    Personal Factors and Comorbidities Comorbidity 1;Comorbidity 3+;Comorbidity 2    Comorbidities Procedure: Lumbar Three-Four Lumbar Four-Five Lumbar Five Sacral One Anterior lumbar interbody fusion; DDD, scoliosis, obesity,  DM,  anxiety, depression, SOB    Examination-Activity Limitations Locomotion Level;Stand;Transfers    Examination-Participation Restrictions Personal Finances    Stability/Clinical Decision Making Evolving/Moderate complexity    Clinical Decision Making Moderate    Rehab Potential Good    PT Frequency 2x / week    PT Duration 6 weeks    PT Treatment/Interventions ADLs/Self Care Home Management;Cryotherapy;Electrical Stimulation;Ultrasound;Moist Heat;Iontophoresis 4mg /ml Dexamethasone;Gait training;Stair training;Functional mobility training;Therapeutic activities;Therapeutic exercise;Balance training;Manual techniques;Patient/family education;Passive range of motion;Dry needling;Taping    PT Next Visit Plan Continue progression of lumbopelvic exs. Complete ther ex that translates to ex equipment she has at her gym.    PT Home Exercise Plan [id number](initial seated lumbar flexion and standing ITB stretch), 03/18/20- added PPT with march , sit-stand, bridge with ball squeze, LTR    Consulted and Agree with Plan of Care Patient           Patient will benefit from skilled therapeutic intervention in order to improve the following deficits and impairments:  Abnormal gait,Difficulty walking,Obesity,Decreased activity tolerance,Increased muscle spasms,Pain,Decreased strength,Postural dysfunction  Visit Diagnosis: Degeneration of lumbar intervertebral disc  Other intervertebral disc degeneration, lumbar region  Muscle spasm of back  Muscle weakness (generalized)  Chronic left-sided low back pain with bilateral sciatica  Other abnormalities of gait and mobility  Foot drop, left  Difficulty in walking, not elsewhere classified  Abnormal posture     Problem List Patient Active Problem List   Diagnosis Date Noted  . Adjustment disorder with mixed disturbance of emotions and conduct 08/17/2019  . Suicidal ideations 08/17/2019  . Lumbar radiculopathy, chronic 07/08/2019  . Degenerative  disc disease at L5-S1 level 03/25/2019  . Ventral incisional hernia 03/12/2019  . Type 2 diabetes mellitus with hyperglycemia ([place]) 03/07/2019  . Chronic kidney disease (CKD), stage III (moderate) ([place]) 02/14/2019  . Mixed hyperlipidemia 02/11/2019  . MDD (major depressive disorder) 02/11/2019  . Foot drop, left foot 12/15/2016  . Syncope 04/18/2016  . Chronic pain 04/18/2016  . 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 BREAST MASS, LEFT 09/26/2007  . RHINITIS, ALLERGIC NOS 10/26/2006  . Graves' disease 10/24/2006  . Hypothyroidism 10/24/2006  . Hypertension 10/24/2006  . HX, PERSONAL, CERVICAL DYSPLASIA 10/24/2006    Gar Ponto MS, PT 03/24/20 6:45 PM  [facility] [address] [place], [place], [address] Phone: [phone]   Fax:  [phone]  Name: Karen Nixon MRN: [id number] Date of Birth: 12-Feb-1971

## 2020-03-26 ENCOUNTER — Ambulatory Visit: Payer: Medicaid Other

## 2020-03-26 ENCOUNTER — Other Ambulatory Visit: Payer: Self-pay

## 2020-03-26 DIAGNOSIS — G8929 Other chronic pain: Secondary | ICD-10-CM

## 2020-03-26 DIAGNOSIS — M5136 Other intervertebral disc degeneration, lumbar region: Secondary | ICD-10-CM

## 2020-03-26 DIAGNOSIS — M6281 Muscle weakness (generalized): Secondary | ICD-10-CM

## 2020-03-26 DIAGNOSIS — R262 Difficulty in walking, not elsewhere classified: Secondary | ICD-10-CM

## 2020-03-26 DIAGNOSIS — M62830 Muscle spasm of back: Secondary | ICD-10-CM

## 2020-03-26 DIAGNOSIS — M5442 Lumbago with sciatica, left side: Secondary | ICD-10-CM

## 2020-03-26 DIAGNOSIS — M21372 Foot drop, left foot: Secondary | ICD-10-CM

## 2020-03-26 DIAGNOSIS — R2689 Other abnormalities of gait and mobility: Secondary | ICD-10-CM

## 2020-03-26 DIAGNOSIS — R293 Abnormal posture: Secondary | ICD-10-CM

## 2020-03-26 NOTE — Therapy (Signed)
[place], [place], [address] Phone: [phone]   Fax:  [phone]  Physical Therapy Treatment  Patient Details  Name: Karen Nixon MRN: [id number] Date of Birth: [birth year] Referring Provider (PT): [name], MD   Encounter Date: 03/26/2020   PT End of Session - 03/26/20 1416    Visit Number 6    Number of Visits 13    Date for PT Re-Evaluation 04/18/20    Authorization Type MEDICAID [place] ACCESS approved initial 3 treatments    Progress Note Due on Visit 10    PT Start Time 1400    PT Stop Time 1445    PT Time Calculation (min) 45 min    Activity Tolerance Patient tolerated treatment well    Behavior During Therapy [facility] for tasks assessed/performed           Past Medical History:  Diagnosis Date  . Anxiety   . DDD (degenerative disc disease), lumbar   . Degenerative disc disease, lumbar   . Depression    due to chronic pain  . Diabetes mellitus without complication ([place])   . Dislocation of shoulder    x 9  . GERD (gastroesophageal reflux disease)   . Glaucoma   . Hyperlipemia   . Hypothyroidism   . Left facial numbness 2010   and tingling  and left arm- years ago.  Dr says it was stress  . Scoliosis   . Shortness of breath dyspnea    with exertation  . Thyroid disease   . Umbilical hernia     Past Surgical History:  Procedure Laterality Date  . ABDOMINAL EXPOSURE N/A 07/08/2019   Procedure: ABDOMINAL EXPOSURE;  Surgeon: Rosetta Posner, MD;  [facility];  Service: Vascular;  Laterality: N/A;  . ANTERIOR LUMBAR FUSION N/A 07/08/2019   Procedure: Lumbar Three-Four Lumbar Four-Five Lumbar Five Sacral One Anterior lumbar interbody fusion;  Surgeon: [name], MD;  Location: [place];  Service: Neurosurgery;  Laterality: N/A;  Lumbar Three-Four Lumbar Four-Five Lumbar Five Sacral One Anterior lumbar interbody fusion  . APPLICATION OF ROBOTIC ASSISTANCE FOR SPINAL PROCEDURE N/A  07/08/2019   Procedure: APPLICATION OF ROBOTIC ASSISTANCE FOR SPINAL PROCEDURE;  Surgeon: [name], MD;  Location: [place];  Service: Neurosurgery;  Laterality: N/A;  . BACK SURGERY  2005  . CERVICAL CONIZATION W/BX    . ELBOW SURGERY Right 2007   fracture repair with screws  . INDUCED ABORTION    . LUMBAR PERCUTANEOUS PEDICLE SCREW 3 LEVEL N/A 07/08/2019   Procedure: Posterior percutaneous pedicle screw fixation Lumbar Three to Sacral One;  Surgeon: [name], MD;  Location: [place];  Service: Neurosurgery;  Laterality: N/A;  Posterior percutaneous pedicle screw fixation Lumbar Three to Sacral One  . SHOULDER ARTHROSCOPY Right 09/09/2014   Procedure: RIGHT SHOULDER ARTHROSCOPY WITH LABRAL REPAIR;  Surgeon: Mcarthur Rossetti, MD;  Location: [place];  Service: Orthopedics;  Laterality: Right;  . SHOULDER SURGERY Right 2001   arthroscopy  . TUBAL LIGATION  11/20/1996    There were no vitals filed for this visit.   Subjective Assessment - 03/26/20 1411    Subjective Pt reports she is doing well and her low back is not bothering her. She is to get the Foot Up brace tomorrow.    Patient Stated Goals To minimize the pinching sensation with her L low back    Currently in Pain? No/denies    Pain Score 0-No pain    Pain  Location Back    Pain Orientation Left;Lower    Pain Descriptors / Indicators Aching    Pain Type Chronic pain    Pain Onset More than a month ago    Pain Frequency Occasional                             [facility] Adult PT Treatment/Exercise - 03/26/20 0001      Exercises   Exercises Lumbar;Shoulder      Lumbar Exercises: Aerobic   Nustep L6 UE/LE x 8 minutes      Lumbar Exercises: Machines for Strengthening   Cybex Knee Extension 10x2; 15 lbs   core engagement   Cybex Knee Flexion 10x2; 15 lbs    Leg Press 10x2; 60lbs   core engaged     Shoulder Exercises: ROM/Strengthening   Lat Pull 10 reps   core engaged   Lat Pull Limitations 2x, 15lbs     Cybex Press 10 reps   core engaged   Cybex Press Limitations 2x, 15 lbs    Cybex Row 10 reps   core engaged   Cybex Row Limitations 2x, 15lbs                    PT Short Term Goals - 03/24/20 1840      PT SHORT TERM GOAL #1   Title Pt will be independent with initial HEP    Baseline Started on eval    Status Achieved    Target Date 03/24/20      PT SHORT TERM GOAL #2   Title Pt will voice undestanding of measures to assist with the reduction of her L low back pain.    Status Achieved    Target Date 03/24/20             PT Long Term Goals - 02/27/20 2203      PT LONG TERM GOAL #1   Title Pt will report a consistent reduction in low back pain to 3/10 or less with daily activities and walking for periods of 30 mins.    Period Weeks    Status New    Target Date 04/18/20      PT LONG TERM GOAL #2   Title Return demonstration of proper body mechanics for ADLs to reduce lumbar pain/strain    Status New    Target Date 04/18/20      PT LONG TERM GOAL #3   Title Pt will be Ind in a final HEP to maintain or progress achieved LOF    Status New    Target Date 04/18/20                 Plan - 03/26/20 1602    Clinical Impression Statement PT was completed to prepare the pt to utilize weight machines in a fitness center to continue her rehab after she is Dced from PT. Pt demonstrated the use of core engagement/stability with the weight machines used today. Pt's subjective report indicates sustained improvement in her low back pain.    Personal Factors and Comorbidities Comorbidity 1;Comorbidity 3+;Comorbidity 2    Comorbidities Procedure: Lumbar Three-Four Lumbar Four-Five Lumbar Five Sacral One Anterior lumbar interbody fusion; DDD, scoliosis, obesity, DM, anxiety, depression, SOB    Examination-Activity Limitations Locomotion Level;Stand;Transfers    Examination-Participation Restrictions Personal Finances    Stability/Clinical Decision Making Evolving/Moderate  complexity    Clinical Decision Making Moderate    Rehab Potential Good  PT Frequency 2x / week    PT Duration 6 weeks    PT Treatment/Interventions ADLs/Self Care Home Management;Cryotherapy;Electrical Stimulation;Ultrasound;Moist Heat;Iontophoresis 4mg /ml Dexamethasone;Gait training;Stair training;Functional mobility training;Therapeutic activities;Therapeutic exercise;Balance training;Manual techniques;Patient/family education;Passive range of motion;Dry needling;Taping    PT Next Visit Plan Assess benefit of the Foot Up brace when received. Continue progression of lumbopelvic exs. Complete ther ex that translates to ex equipment she has at her gym.    PT Home Exercise Plan [id number](initial seated lumbar flexion and standing ITB stretch), 03/18/20- added PPT with march , sit-stand, bridge with ball squeze, LTR    Consulted and Agree with Plan of Care Patient           Patient will benefit from skilled therapeutic intervention in order to improve the following deficits and impairments:  Abnormal gait,Difficulty walking,Obesity,Decreased activity tolerance,Increased muscle spasms,Pain,Decreased strength,Postural dysfunction  Visit Diagnosis: Degeneration of lumbar intervertebral disc  Other intervertebral disc degeneration, lumbar region  Muscle spasm of back  Muscle weakness (generalized)  Chronic left-sided low back pain with bilateral sciatica  Other abnormalities of gait and mobility  Foot drop, left  Difficulty in walking, not elsewhere classified  Abnormal posture     Problem List Patient Active Problem List   Diagnosis Date Noted  . Adjustment disorder with mixed disturbance of emotions and conduct 08/17/2019  . Suicidal ideations 08/17/2019  . Lumbar radiculopathy, chronic 07/08/2019  . Degenerative disc disease at L5-S1 level 03/25/2019  . Ventral incisional hernia 03/12/2019  . Type 2 diabetes mellitus with hyperglycemia ([place]) 03/07/2019  . Chronic kidney  disease (CKD), stage III (moderate) ([place]) 02/14/2019  . Mixed hyperlipidemia 02/11/2019  . MDD (major depressive disorder) 02/11/2019  . Foot drop, left foot 12/15/2016  . Syncope 04/18/2016  . Chronic pain 04/18/2016  . 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 BREAST MASS, LEFT 09/26/2007  . RHINITIS, ALLERGIC NOS 10/26/2006  . Graves' disease 10/24/2006  . Hypothyroidism 10/24/2006  . Hypertension 10/24/2006  . HX, PERSONAL, CERVICAL DYSPLASIA 10/24/2006    Gar Ponto MS, PT 03/26/20 11:25 PM  [facility] [address] [place], [place], [address] Phone: [phone]   Fax:  [phone]  Name: Karen Nixon MRN: [id number] Date of Birth: Feb 27, 1971

## 2020-03-31 ENCOUNTER — Encounter: Payer: Self-pay | Admitting: Physical Therapy

## 2020-03-31 ENCOUNTER — Other Ambulatory Visit: Payer: Self-pay

## 2020-03-31 ENCOUNTER — Ambulatory Visit: Payer: Medicaid Other | Attending: Neurological Surgery | Admitting: Physical Therapy

## 2020-03-31 DIAGNOSIS — M62830 Muscle spasm of back: Secondary | ICD-10-CM

## 2020-03-31 DIAGNOSIS — M5441 Lumbago with sciatica, right side: Secondary | ICD-10-CM | POA: Insufficient documentation

## 2020-03-31 DIAGNOSIS — M6281 Muscle weakness (generalized): Secondary | ICD-10-CM

## 2020-03-31 DIAGNOSIS — R2689 Other abnormalities of gait and mobility: Secondary | ICD-10-CM

## 2020-03-31 DIAGNOSIS — R262 Difficulty in walking, not elsewhere classified: Secondary | ICD-10-CM | POA: Diagnosis present

## 2020-03-31 DIAGNOSIS — M21372 Foot drop, left foot: Secondary | ICD-10-CM

## 2020-03-31 DIAGNOSIS — M5442 Lumbago with sciatica, left side: Secondary | ICD-10-CM | POA: Insufficient documentation

## 2020-03-31 DIAGNOSIS — M5136 Other intervertebral disc degeneration, lumbar region: Secondary | ICD-10-CM | POA: Diagnosis not present

## 2020-03-31 DIAGNOSIS — M545 Low back pain, unspecified: Secondary | ICD-10-CM | POA: Diagnosis present

## 2020-03-31 DIAGNOSIS — M51369 Other intervertebral disc degeneration, lumbar region without mention of lumbar back pain or lower extremity pain: Secondary | ICD-10-CM

## 2020-03-31 DIAGNOSIS — G8929 Other chronic pain: Secondary | ICD-10-CM | POA: Diagnosis present

## 2020-03-31 DIAGNOSIS — R293 Abnormal posture: Secondary | ICD-10-CM | POA: Diagnosis present

## 2020-03-31 NOTE — Therapy (Signed)
[place], [place], [address] Phone: [phone]   Fax:  [phone]  Physical Therapy Treatment  Patient Details  Name: Karen Nixon MRN: [id number] Date of Birth: [birth year] Referring Provider (PT): [name], MD   Encounter Date: 03/31/2020   PT End of Session - 03/31/20 1322    Visit Number 7    Number of Visits 13    Date for PT Re-Evaluation 04/18/20    Authorization Type MEDICAID [place] ACCESS approved initial 3 treatments    Authorization Time Period 03/11/20-03/31/20, 3 treatments    PT Start Time 1315    PT Stop Time 1400    PT Time Calculation (min) 45 min           Past Medical History:  Diagnosis Date  . Anxiety   . DDD (degenerative disc disease), lumbar   . Degenerative disc disease, lumbar   . Depression    due to chronic pain  . Diabetes mellitus without complication ([place])   . Dislocation of shoulder    x 9  . GERD (gastroesophageal reflux disease)   . Glaucoma   . Hyperlipemia   . Hypothyroidism   . Left facial numbness 2010   and tingling  and left arm- years ago.  Dr says it was stress  . Scoliosis   . Shortness of breath dyspnea    with exertation  . Thyroid disease   . Umbilical hernia     Past Surgical History:  Procedure Laterality Date  . ABDOMINAL EXPOSURE N/A 07/08/2019   Procedure: ABDOMINAL EXPOSURE;  Surgeon: Rosetta Posner, MD;  [facility];  Service: Vascular;  Laterality: N/A;  . ANTERIOR LUMBAR FUSION N/A 07/08/2019   Procedure: Lumbar Three-Four Lumbar Four-Five Lumbar Five Sacral One Anterior lumbar interbody fusion;  Surgeon: [name], MD;  Location: [facility];  Service: Neurosurgery;  Laterality: N/A;  Lumbar Three-Four Lumbar Four-Five Lumbar Five Sacral One Anterior lumbar interbody fusion  . APPLICATION OF ROBOTIC ASSISTANCE FOR SPINAL PROCEDURE N/A 07/08/2019   Procedure: APPLICATION OF ROBOTIC ASSISTANCE FOR SPINAL PROCEDURE;  Surgeon:  [name], MD;  Location: [facility];  Service: Neurosurgery;  Laterality: N/A;  . BACK SURGERY  2005  . CERVICAL CONIZATION W/BX    . ELBOW SURGERY Right 2007   fracture repair with screws  . INDUCED ABORTION    . LUMBAR PERCUTANEOUS PEDICLE SCREW 3 LEVEL N/A 07/08/2019   Procedure: Posterior percutaneous pedicle screw fixation Lumbar Three to Sacral One;  Surgeon: [name], MD;  Location: [facility];  Service: Neurosurgery;  Laterality: N/A;  Posterior percutaneous pedicle screw fixation Lumbar Three to Sacral One  . SHOULDER ARTHROSCOPY Right 09/09/2014   Procedure: RIGHT SHOULDER ARTHROSCOPY WITH LABRAL REPAIR;  Surgeon: Mcarthur Rossetti, MD;  Location: [facility];  Service: Orthopedics;  Laterality: Right;  . SHOULDER SURGERY Right 2001   arthroscopy  . TUBAL LIGATION  11/20/1996    There were no vitals filed for this visit.                      [facility] Adult PT Treatment/Exercise - 03/31/20 0001      Lumbar Exercises: Stretches   Single Knee to Chest Stretch 2 reps;20 seconds    Lower Trunk Rotation Limitations 10 reps , 10 sec each      Lumbar Exercises: Aerobic   Nustep L6 UE/LE x 5 minutes      Lumbar Exercises: Machines for Strengthening  Cybex Knee Extension 40 reps 15#    Cybex Knee Flexion 40 reps 25#    Leg Press 40 reps 60lbs      Lumbar Exercises: Seated   Sit to Stand Limitations 10 reps 3 sets , two sets with 10# KB      Lumbar Exercises: Supine   Bent Knee Raise Limitations PPT with march 12 x 2    Bridge --   15 reps   Bridge Limitations with PPT                    PT Short Term Goals - 03/31/20 1323      PT SHORT TERM GOAL #1   Title Pt will be independent with initial HEP    Period Weeks    Status Achieved      PT SHORT TERM GOAL #2   Time 4    Period Weeks    Status Achieved      PT SHORT TERM GOAL #3   Title Pt will be able to lie on Right side to sleelp for 4 or more uninterrupted hours of sleep a night     Baseline Pt utilizes stretches to allow for comfortable and longer sleep times.    Time 4    Period Weeks    Status Achieved             PT Long Term Goals - 03/31/20 1325      PT LONG TERM GOAL #1   Title Pt will report a consistent reduction in low back pain to 3/10 or less with daily activities and walking for periods of 30 mins.    Baseline can complete 30 minutes of daily activiy and walking without increased pain.    Time 4    Period Weeks    Status Achieved      PT LONG TERM GOAL #2   Title Return demonstration of proper body mechanics for ADLs to reduce lumbar pain/strain    Baseline Most of the time she reports being mindful of body mechaincs, otherwise she will have an intermittent sharp pain.    Time 4    Period Weeks    Status Achieved      PT LONG TERM GOAL #3   Title Pt will be Ind in a final HEP to maintain or progress achieved LOF    Time 4    Status On-going                 Plan - 03/31/20 1335    Clinical Impression Statement Pt arrives with Foot -Up brace donned and reports "rubbing at anterior and posterior left ankle. She was encouraged to try different socks, sleeves or ankle wraps to decrease rubbing. Continued with gym machines for strengthening. She reports minimal back pain lately and she can complete 30 minutes or more of activity without lumbar pain. LTG #1 met. She reports using her body mechanics training to prevent lumbar strain and pain with transitions and bending/lifting. LTG# 2 met. Progressed Sit-stands adding 10# weight and she tolerated 2 sets of 10 without back pain, only fatigue. She required mod cues to perform goblet squat using Kettle bell while maintaining neutral spine. She will benefit from additional skilled PT to progress to independence with advanced HEP using weights with correct body mechanics.    PT Next Visit Plan Assess benefit of the Foot Up brace when received. Continue progression of lumbopelvic exs and squats/lifting,  . Complete ther ex that translates  to ex equipment she has at her gym.    PT Home Exercise Plan [id number](initial seated lumbar flexion and standing ITB stretch), 03/18/20- added PPT with march , sit-stand, bridge with ball squeze, LTR    Consulted and Agree with Plan of Care Patient           Patient will benefit from skilled therapeutic intervention in order to improve the following deficits and impairments:  Abnormal gait,Difficulty walking,Obesity,Decreased activity tolerance,Increased muscle spasms,Pain,Decreased strength,Postural dysfunction  Visit Diagnosis: Degeneration of lumbar intervertebral disc  Other intervertebral disc degeneration, lumbar region  Muscle spasm of back  Muscle weakness (generalized)  Chronic left-sided low back pain with bilateral sciatica  Other abnormalities of gait and mobility  Foot drop, left  Difficulty in walking, not elsewhere classified  Abnormal posture  Chronic left-sided low back pain without sciatica     Problem List Patient Active Problem List   Diagnosis Date Noted  . Adjustment disorder with mixed disturbance of emotions and conduct 08/17/2019  . Suicidal ideations 08/17/2019  . Lumbar radiculopathy, chronic 07/08/2019  . Degenerative disc disease at L5-S1 level 03/25/2019  . Ventral incisional hernia 03/12/2019  . Type 2 diabetes mellitus with hyperglycemia ([place]) 03/07/2019  . Chronic kidney disease (CKD), stage III (moderate) ([place]) 02/14/2019  . Mixed hyperlipidemia 02/11/2019  . MDD (major depressive disorder) 02/11/2019  . Foot drop, left foot 12/15/2016  . Syncope 04/18/2016  . Chronic pain 04/18/2016  . 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 BREAST MASS, LEFT 09/26/2007  . RHINITIS, ALLERGIC NOS 10/26/2006  . Graves' disease 10/24/2006  . Hypothyroidism 10/24/2006  . Hypertension 10/24/2006  . HX, PERSONAL, CERVICAL DYSPLASIA 10/24/2006    Dorene Ar, [place] 03/31/2020, 2:03 PM  [facility] [address] [place], [place], [address] Phone: [phone]   Fax:  [phone]  Name: Karen Nixon MRN: [id number] Date of Birth: August 23, 1970

## 2020-04-02 ENCOUNTER — Ambulatory Visit: Payer: Medicaid Other

## 2020-04-02 ENCOUNTER — Other Ambulatory Visit: Payer: Self-pay

## 2020-04-02 DIAGNOSIS — M5136 Other intervertebral disc degeneration, lumbar region: Secondary | ICD-10-CM

## 2020-04-02 DIAGNOSIS — G8929 Other chronic pain: Secondary | ICD-10-CM

## 2020-04-02 DIAGNOSIS — R293 Abnormal posture: Secondary | ICD-10-CM

## 2020-04-02 DIAGNOSIS — R2689 Other abnormalities of gait and mobility: Secondary | ICD-10-CM

## 2020-04-02 DIAGNOSIS — R262 Difficulty in walking, not elsewhere classified: Secondary | ICD-10-CM

## 2020-04-02 DIAGNOSIS — M21372 Foot drop, left foot: Secondary | ICD-10-CM

## 2020-04-02 DIAGNOSIS — M6281 Muscle weakness (generalized): Secondary | ICD-10-CM

## 2020-04-02 DIAGNOSIS — M5441 Lumbago with sciatica, right side: Secondary | ICD-10-CM

## 2020-04-02 DIAGNOSIS — M62830 Muscle spasm of back: Secondary | ICD-10-CM

## 2020-04-03 NOTE — Therapy (Signed)
[place], [place], [address] Phone: [phone]   Fax:  [phone]  Physical Therapy Treatment  Patient Details  Name: Karen Nixon MRN: [id number] Date of Birth: 1970-03-18 Referring Provider (PT): [name], MD   Encounter Date: 04/02/2020   PT End of Session - 04/02/20 1419    Visit Number 8    Number of Visits 13    Date for PT Re-Evaluation 04/18/20    Authorization Type MEDICAID [place] ACCESS approved initial 3 treatments    Progress Note Due on Visit 10    PT Start Time 1405    PT Stop Time 1445    PT Time Calculation (min) 40 min    Activity Tolerance Patient tolerated treatment well    Behavior During Therapy [facility] for tasks assessed/performed           Past Medical History:  Diagnosis Date  . Anxiety   . DDD (degenerative disc disease), lumbar   . Degenerative disc disease, lumbar   . Depression    due to chronic pain  . Diabetes mellitus without complication ([place])   . Dislocation of shoulder    x 9  . GERD (gastroesophageal reflux disease)   . Glaucoma   . Hyperlipemia   . Hypothyroidism   . Left facial numbness 2010   and tingling  and left arm- years ago.  Dr says it was stress  . Scoliosis   . Shortness of breath dyspnea    with exertation  . Thyroid disease   . Umbilical hernia     Past Surgical History:  Procedure Laterality Date  . ABDOMINAL EXPOSURE N/A 07/08/2019   Procedure: ABDOMINAL EXPOSURE;  Surgeon: Rosetta Posner, MD;  [facility];  Service: Vascular;  Laterality: N/A;  . ANTERIOR LUMBAR FUSION N/A 07/08/2019   Procedure: Lumbar Three-Four Lumbar Four-Five Lumbar Five Sacral One Anterior lumbar interbody fusion;  Surgeon: [name], MD;  Location: [facility];  Service: Neurosurgery;  Laterality: N/A;  Lumbar Three-Four Lumbar Four-Five Lumbar Five Sacral One Anterior lumbar interbody fusion  . APPLICATION OF ROBOTIC ASSISTANCE FOR SPINAL PROCEDURE N/A  07/08/2019   Procedure: APPLICATION OF ROBOTIC ASSISTANCE FOR SPINAL PROCEDURE;  Surgeon: [name], MD;  Location: [facility];  Service: Neurosurgery;  Laterality: N/A;  . BACK SURGERY  2005  . CERVICAL CONIZATION W/BX    . ELBOW SURGERY Right 2007   fracture repair with screws  . INDUCED ABORTION    . LUMBAR PERCUTANEOUS PEDICLE SCREW 3 LEVEL N/A 07/08/2019   Procedure: Posterior percutaneous pedicle screw fixation Lumbar Three to Sacral One;  Surgeon: [name], MD;  Location: [facility];  Service: Neurosurgery;  Laterality: N/A;  Posterior percutaneous pedicle screw fixation Lumbar Three to Sacral One  . SHOULDER ARTHROSCOPY Right 09/09/2014   Procedure: RIGHT SHOULDER ARTHROSCOPY WITH LABRAL REPAIR;  Surgeon: Mcarthur Rossetti, MD;  Location: [facility];  Service: Orthopedics;  Laterality: Right;  . SHOULDER SURGERY Right 2001   arthroscopy  . TUBAL LIGATION  11/20/1996    There were no vitals filed for this visit.   Subjective Assessment - 04/03/20 0837    Subjective Pt reports her low back is not bothering her. Pt states she has had an appt with [facility] for an AFO. Pt states she is wearing a higher sock to decrease rubbing of the Foot-Up brace.    Patient Stated Goals To minimize the pinching sensation with her L low back    Currently in  Pain? No/denies    Pain Location Back    Pain Orientation Posterior;Lower    Pain Descriptors / Indicators Aching    Pain Type Chronic pain    Pain Onset More than a month ago    Pain Frequency Occasional                             [facility] Adult PT Treatment/Exercise - 04/03/20 0001      Ambulation/Gait   Gait Comments Foot up brace was adjusted to provide better DF. Pt was able to step through without an excesstive  steppage or circumduction gait pattern.      Lumbar Exercises: Aerobic   Nustep L6 UE/LE x 5 minutes      Lumbar Exercises: Machines for Strengthening   Cybex Knee Extension 2x15 reps 20#   core  engagement   Cybex Knee Flexion 2x15 reps 25#   core engagement   Leg Press 2x15 reps 60lbs   core engagement     Shoulder Exercises: ROM/Strengthening   Lat Pull 15 reps   core engaged   Lat Pull Limitations 2x, 20lbs    Cybex Press 15 reps   core engaged   Cybex Press Limitations 2x, 20lbs    Cybex Row 15 reps   core engaged   Cybex Row Limitations 2x, 20lbs                  PT Education - 04/03/20 0839    Education Details Foot-up brace adjustments to provide better ankle DF assist with walking    Person(s) Educated Patient    Methods Explanation;Demonstration    Comprehension Verbalized understanding            PT Short Term Goals - 03/31/20 1323      PT SHORT TERM GOAL #1   Title Pt will be independent with initial HEP    Period Weeks    Status Achieved      PT SHORT TERM GOAL #2   Time 4    Period Weeks    Status Achieved      PT SHORT TERM GOAL #3   Title Pt will be able to lie on Right side to sleelp for 4 or more uninterrupted hours of sleep a night    Baseline Pt utilizes stretches to allow for comfortable and longer sleep times.    Time 4    Period Weeks    Status Achieved             PT Long Term Goals - 03/31/20 1325      PT LONG TERM GOAL #1   Title Pt will report a consistent reduction in low back pain to 3/10 or less with daily activities and walking for periods of 30 mins.    Baseline can complete 30 minutes of daily activiy and walking without increased pain.    Time 4    Period Weeks    Status Achieved      PT LONG TERM GOAL #2   Title Return demonstration of proper body mechanics for ADLs to reduce lumbar pain/strain    Baseline Most of the time she reports being mindful of body mechaincs, otherwise she will have an intermittent sharp pain.    Time 4    Period Weeks    Status Achieved      PT LONG TERM GOAL #3   Title Pt will be Ind in a final HEP to maintain  or progress achieved LOF    Time 4    Status On-going                  Plan - 04/02/20 1422    Clinical Impression Statement Foot-Up brace was adjusted to provided improve ankle DF assist for an improved gait pattern. PT was completed for UE, LE, and trunk strengthening with the weight machines. pt completed all exs with reminders for core engagement. Pt was able to tolerate progression in resistance levels for strengthening exs. Pt tolerated PT session without adverse effects. Pt will continue to benefit from skilled PT to minimize pain and optimize function.    Personal Factors and Comorbidities Comorbidity 1;Comorbidity 3+;Comorbidity 2    Comorbidities Procedure: Lumbar Three-Four Lumbar Four-Five Lumbar Five Sacral One Anterior lumbar interbody fusion; DDD, scoliosis, obesity, DM, anxiety, depression, SOB    Examination-Activity Limitations Locomotion Level;Stand;Transfers    Examination-Participation Restrictions Personal Finances    Stability/Clinical Decision Making Evolving/Moderate complexity    Clinical Decision Making Moderate    Rehab Potential Good    PT Frequency 2x / week    PT Duration 6 weeks    PT Treatment/Interventions ADLs/Self Care Home Management;Cryotherapy;Electrical Stimulation;Ultrasound;Moist Heat;Iontophoresis 4mg /ml Dexamethasone;Gait training;Stair training;Functional mobility training;Therapeutic activities;Therapeutic exercise;Balance training;Manual techniques;Patient/family education;Passive range of motion;Dry needling;Taping    PT Next Visit Plan Continue progression of lumbopelvic exs and squats/lifting, . Complete ther ex that translates to ex equipment she has at her gym.    PT Home Exercise Plan [id number](initial seated lumbar flexion and standing ITB stretch), 03/18/20- added PPT with march , sit-stand, bridge with ball squeze, LTR    Consulted and Agree with Plan of Care Patient           Patient will benefit from skilled therapeutic intervention in order to improve the following deficits and impairments:   Abnormal gait,Difficulty walking,Obesity,Decreased activity tolerance,Increased muscle spasms,Pain,Decreased strength,Postural dysfunction  Visit Diagnosis: Degeneration of lumbar intervertebral disc  Other intervertebral disc degeneration, lumbar region  Muscle spasm of back  Muscle weakness (generalized)  Other abnormalities of gait and mobility  Chronic left-sided low back pain with bilateral sciatica  Foot drop, left  Difficulty in walking, not elsewhere classified  Abnormal posture     Problem List Patient Active Problem List   Diagnosis Date Noted  . Adjustment disorder with mixed disturbance of emotions and conduct 08/17/2019  . Suicidal ideations 08/17/2019  . Lumbar radiculopathy, chronic 07/08/2019  . Degenerative disc disease at L5-S1 level 03/25/2019  . Ventral incisional hernia 03/12/2019  . Type 2 diabetes mellitus with hyperglycemia ([place]) 03/07/2019  . Chronic kidney disease (CKD), stage III (moderate) ([place]) 02/14/2019  . Mixed hyperlipidemia 02/11/2019  . MDD (major depressive disorder) 02/11/2019  . Foot drop, left foot 12/15/2016  . Syncope 04/18/2016  . Chronic pain 04/18/2016  . 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 BREAST MASS, LEFT 09/26/2007  . RHINITIS, ALLERGIC NOS 10/26/2006  . Graves' disease 10/24/2006  . Hypothyroidism 10/24/2006  . Hypertension 10/24/2006  . HX, PERSONAL, CERVICAL DYSPLASIA 10/24/2006    Gar Ponto MS, PT 04/03/20 8:51 AM  [facility] [address] [place], [place], [address] Phone: [phone]   Fax:  [phone]  Name: Karen Nixon MRN: [id number] Date of Birth: January 11, 1971

## 2020-04-07 ENCOUNTER — Ambulatory Visit: Payer: Medicaid Other | Admitting: Physical Therapy

## 2020-04-07 ENCOUNTER — Other Ambulatory Visit: Payer: Self-pay

## 2020-04-07 ENCOUNTER — Encounter: Payer: Self-pay | Admitting: Physical Therapy

## 2020-04-07 DIAGNOSIS — M5442 Lumbago with sciatica, left side: Secondary | ICD-10-CM

## 2020-04-07 DIAGNOSIS — R2689 Other abnormalities of gait and mobility: Secondary | ICD-10-CM

## 2020-04-07 DIAGNOSIS — M6281 Muscle weakness (generalized): Secondary | ICD-10-CM

## 2020-04-07 DIAGNOSIS — M62830 Muscle spasm of back: Secondary | ICD-10-CM

## 2020-04-07 DIAGNOSIS — M5136 Other intervertebral disc degeneration, lumbar region: Secondary | ICD-10-CM

## 2020-04-07 DIAGNOSIS — G8929 Other chronic pain: Secondary | ICD-10-CM

## 2020-04-07 NOTE — Therapy (Addendum)
[facility] [place], [place], [address] Phone: [phone]   Fax:  [phone]  Physical Therapy Treatment/Discharge Patient Details  Name: Karen Nixon MRN: [id number] Date of Birth: 04-18-1970 Referring Provider (PT): [name], MD   Encounter Date: 04/07/2020   PT End of Session - 04/07/20 1330    Visit Number 9    Number of Visits 13    Date for PT Re-Evaluation 04/18/20    Authorization Type MEDICAID [place] ACCESS approved initial 3 treatments    Authorization Time Period 03/11/20-03/31/20, 3 treatments, additonal auth    PT Start Time 8416    PT Stop Time 1355    PT Time Calculation (min) 38 min           Past Medical History:  Diagnosis Date  . Anxiety   . DDD (degenerative disc disease), lumbar   . Degenerative disc disease, lumbar   . Depression    due to chronic pain  . Diabetes mellitus without complication ([place])   . Dislocation of shoulder    x 9  . GERD (gastroesophageal reflux disease)   . Glaucoma   . Hyperlipemia   . Hypothyroidism   . Left facial numbness 2010   and tingling  and left arm- years ago.  Dr says it was stress  . Scoliosis   . Shortness of breath dyspnea    with exertation  . Thyroid disease   . Umbilical hernia     Past Surgical History:  Procedure Laterality Date  . ABDOMINAL EXPOSURE N/A 07/08/2019   Procedure: ABDOMINAL EXPOSURE;  Surgeon: Rosetta Posner, MD;  [facility];  Service: Vascular;  Laterality: N/A;  . ANTERIOR LUMBAR FUSION N/A 07/08/2019   Procedure: Lumbar Three-Four Lumbar Four-Five Lumbar Five Sacral One Anterior lumbar interbody fusion;  Surgeon: [name], MD;  Location: [facility];  Service: Neurosurgery;  Laterality: N/A;  Lumbar Three-Four Lumbar Four-Five Lumbar Five Sacral One Anterior lumbar interbody fusion  . APPLICATION OF ROBOTIC ASSISTANCE FOR SPINAL PROCEDURE N/A 07/08/2019   Procedure: APPLICATION OF ROBOTIC ASSISTANCE FOR SPINAL  PROCEDURE;  Surgeon: [name], MD;  Location: [facility];  Service: Neurosurgery;  Laterality: N/A;  . BACK SURGERY  2005  . CERVICAL CONIZATION W/BX    . ELBOW SURGERY Right 2007   fracture repair with screws  . INDUCED ABORTION    . LUMBAR PERCUTANEOUS PEDICLE SCREW 3 LEVEL N/A 07/08/2019   Procedure: Posterior percutaneous pedicle screw fixation Lumbar Three to Sacral One;  Surgeon: [name], MD;  Location: [facility];  Service: Neurosurgery;  Laterality: N/A;  Posterior percutaneous pedicle screw fixation Lumbar Three to Sacral One  . SHOULDER ARTHROSCOPY Right 09/09/2014   Procedure: RIGHT SHOULDER ARTHROSCOPY WITH LABRAL REPAIR;  Surgeon: Mcarthur Rossetti, MD;  Location: [facility];  Service: Orthopedics;  Laterality: Right;  . SHOULDER SURGERY Right 2001   arthroscopy  . TUBAL LIGATION  11/20/1996    There were no vitals filed for this visit.   Subjective Assessment - 04/07/20 1320    Subjective I had a little pinching this morning but no pain now. I just walked 30 minutes at the gym.    Currently in Pain? No/denies              [facility] PT Assessment - 04/07/20 0001      AROM   Overall AROM Comments Trunk ROM were found to be WFLS. No pinching with rotation, reaches shoes.  [facility] PT Treatment/Exercise - 04/07/20 0001      Lumbar Exercises: Stretches   Active Hamstring Stretch Limitations single leg , long sitting    Single Knee to Chest Stretch 2 reps;20 seconds    Lower Trunk Rotation Limitations 10 reps , 10 sec each      Lumbar Exercises: Machines for Strengthening   Cybex Knee Extension 2x15 reps 20#   core engagement   Cybex Knee Flexion 2x15 reps 35#   core engagement   Leg Press 2x15 reps 65lbs   core engagement     Shoulder Exercises: ROM/Strengthening   Lat Pull 15 reps   core engaged   Lat Pull Limitations 2x, 20lbs    Cybex Press 15 reps   core engaged   Cybex Press Limitations 2x, 20lbs    Cybex Row 15 reps    core engaged   Cybex Row Limitations 2x, 20lbs                    PT Short Term Goals - 04/07/20 1349      PT SHORT TERM GOAL #1   Title Pt will be independent with initial HEP    Baseline Started on eval    Time 4    Period Weeks    Status Achieved    Target Date 03/24/20      PT SHORT TERM GOAL #2   Title Pt will voice undestanding of measures to assist with the reduction of her L low back pain.    Baseline 0/10    Status Achieved      PT SHORT TERM GOAL #3   Title Pt will be able to lie on Right side to sleelp for 4 or more uninterrupted hours of sleep a night    Baseline Pt utilizes stretches to allow for comfortable and longer sleep times.    Time 4    Period Weeks    Status Achieved      PT SHORT TERM GOAL #4   Title "Demonstrate and verbalize understanding of condition management including RICE, positioning, HEP.     Baseline able to verbalize    Time 4    Period Weeks    Status Achieved             PT Long Term Goals - 04/07/20 1350      PT LONG TERM GOAL #1   Title Pt will report a consistent reduction in low back pain to 3/10 or less with daily activities and walking for periods of 30 mins.    Baseline can complete 30 minutes of daily activiy and walking without increased pain.    Time 4    Period Weeks    Status Achieved      PT LONG TERM GOAL #2   Title Return demonstration of proper body mechanics for ADLs to reduce lumbar pain/strain    Baseline Most of the time she reports being mindful of body mechaincs, otherwise she will have an intermittent sharp pain.    Time 4    Period Weeks    Status Achieved      PT LONG TERM GOAL #3   Title Pt will be Ind in a final HEP to maintain or progress achieved LOF    Time 4    Period Weeks    Status Achieved                 Plan - 04/07/20 1333    Clinical Impression Statement Pt  reports no pain and has full AROM of trunk without pinching. She has returned to walking 30 minutes, 2 x per  day. She is attending gym and is now comfortable using gym machines after being guided during last 3 PT treatments. She is independent with her HEP and feels ready for discharge to HEP.    PT Next Visit Plan Continue progression of lumbopelvic exs and squats/lifting, . Complete ther ex that translates to ex equipment she has at her gym.    PT Home Exercise Plan [id number](initial seated lumbar flexion and standing ITB stretch), 03/18/20- added PPT with march , sit-stand, bridge with ball squeze, LTR    Consulted and Agree with Plan of Care Patient           Patient will benefit from skilled therapeutic intervention in order to improve the following deficits and impairments:  Abnormal gait,Difficulty walking,Obesity,Decreased activity tolerance,Increased muscle spasms,Pain,Decreased strength,Postural dysfunction  Visit Diagnosis: Other intervertebral disc degeneration, lumbar region  Muscle spasm of back  Muscle weakness (generalized)  Other abnormalities of gait and mobility  Chronic left-sided low back pain with bilateral sciatica     Problem List Patient Active Problem List   Diagnosis Date Noted  . Adjustment disorder with mixed disturbance of emotions and conduct 08/17/2019  . Suicidal ideations 08/17/2019  . Lumbar radiculopathy, chronic 07/08/2019  . Degenerative disc disease at L5-S1 level 03/25/2019  . Ventral incisional hernia 03/12/2019  . Type 2 diabetes mellitus with hyperglycemia ([place]) 03/07/2019  . Chronic kidney disease (CKD), stage III (moderate) ([place]) 02/14/2019  . Mixed hyperlipidemia 02/11/2019  . MDD (major depressive disorder) 02/11/2019  . Foot drop, left foot 12/15/2016  . Syncope 04/18/2016  . Chronic pain 04/18/2016  . 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 BREAST MASS, LEFT 09/26/2007  . RHINITIS, ALLERGIC NOS 10/26/2006  . Graves' disease 10/24/2006  . Hypothyroidism 10/24/2006  . Hypertension 10/24/2006  . HX, PERSONAL, CERVICAL  DYSPLASIA 10/24/2006    Dorene Ar, PTA 04/07/2020, 1:58 PM  [facility] [address] [place], [place], [address] Phone: [phone]   Fax:  [phone]  Name: NAYZETH ALTMAN MRN: [id number] Date of Birth: 01/17/1971   PHYSICAL THERAPY DISCHARGE SUMMARY  Visits from Start of Care: 9 Current functional level related to goals / functional outcomes: See above   Remaining deficits: See above   Education / Equipment: HEP  Plan: Patient agrees to discharge.  Patient goals were met. Patient is being discharged due to being pleased with the current functional level.  ?????

## 2020-04-09 ENCOUNTER — Ambulatory Visit: Payer: Medicaid Other

## 2020-04-14 ENCOUNTER — Encounter: Payer: Medicaid Other | Admitting: Physical Therapy

## 2020-08-14 IMAGING — CT CT HEAD CODE STROKE
3 series · 15 of 47 positions shown, 18 images · non-contrast
Comparison: CT head 04/19/2016

CLINICAL DATA: Code stroke.  Left facial droop and arm weakness.

EXAM:
CT HEAD WITHOUT CONTRAST
TECHNIQUE: Contiguous axial images were obtained from the base of the skull
through the vertex without intravenous contrast.

[Series 3: head 5.0 st · axial · 0.42mm/px · z∈[-72,+58]mm · 9 of 32 slices shown, 12 images]
[im 3/32  brain]
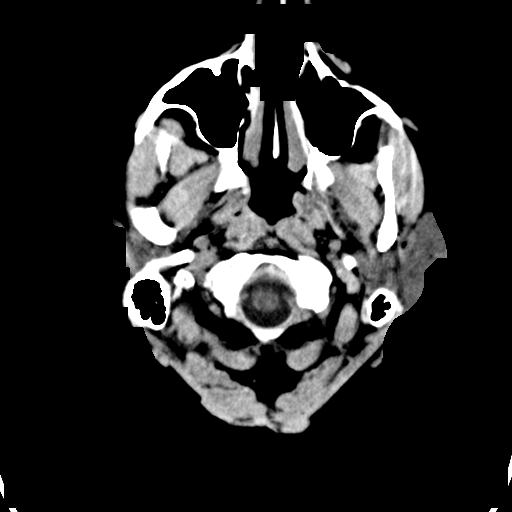
[im 3/32  bone]
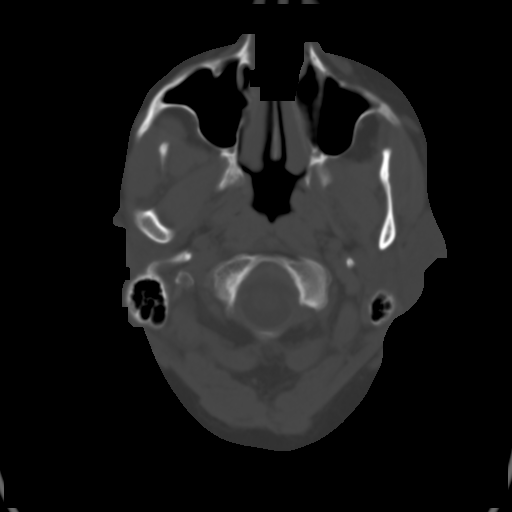
[im 6/32  brain]
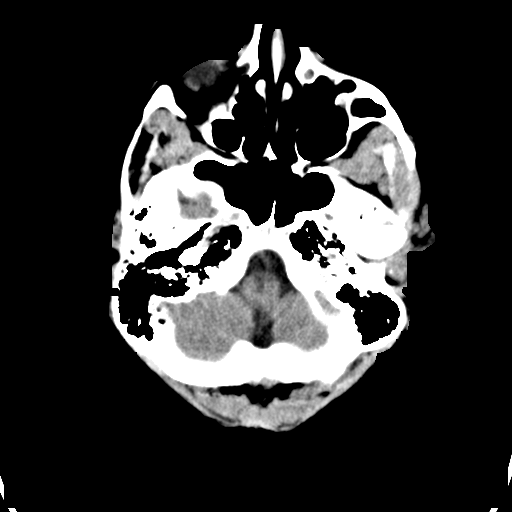
[im 9/32  brain]
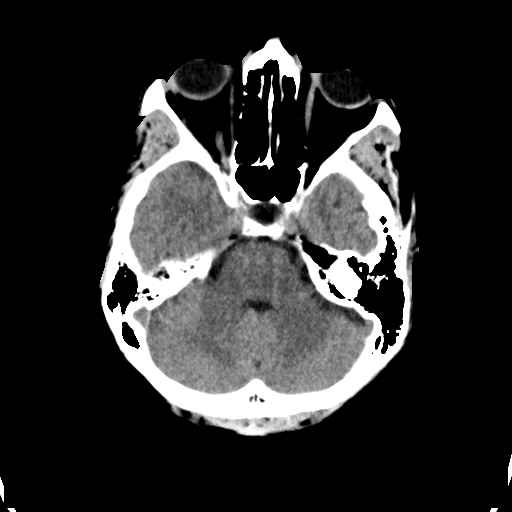
[im 12/32  brain]
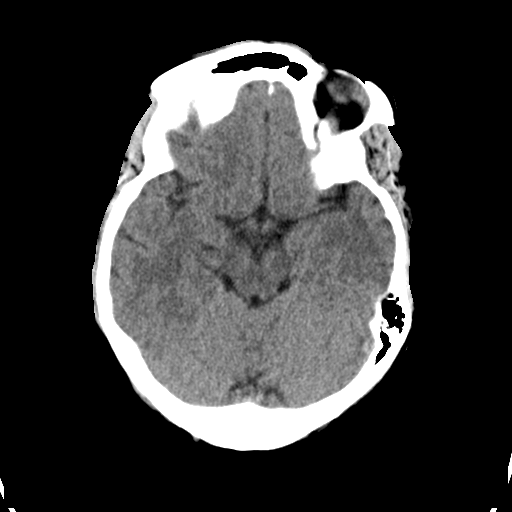
[im 17/32  brain]
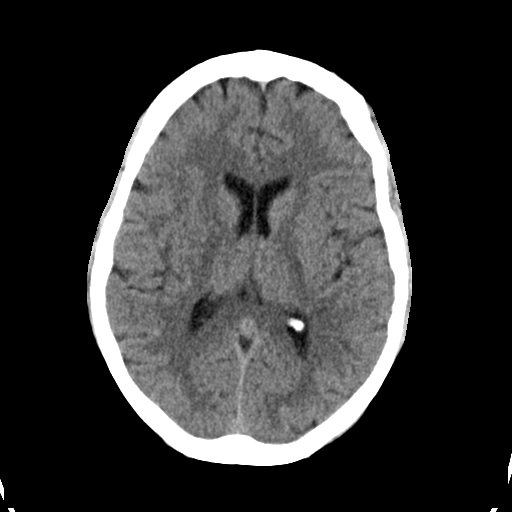
[im 17/32  bone]
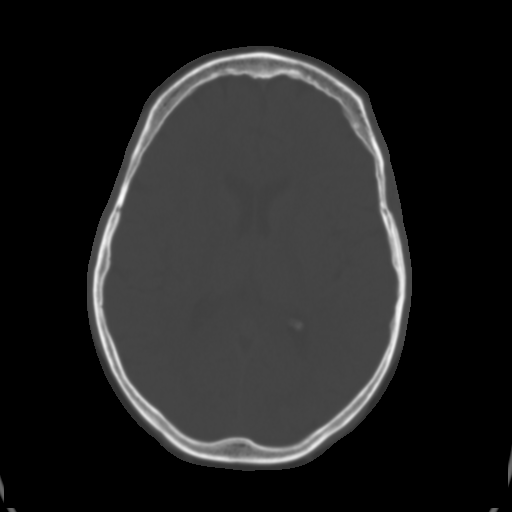
[im 20/32  brain]
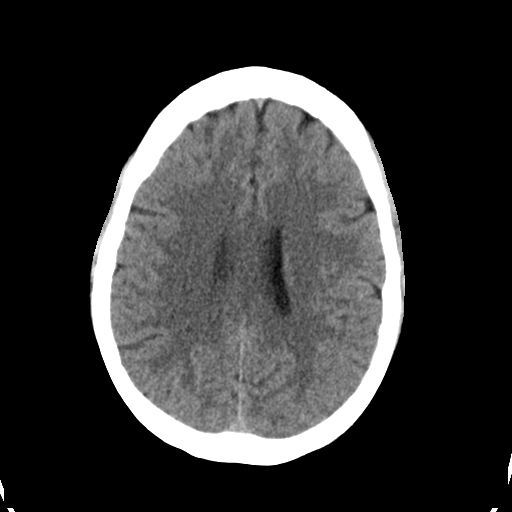
[im 23/32  brain]
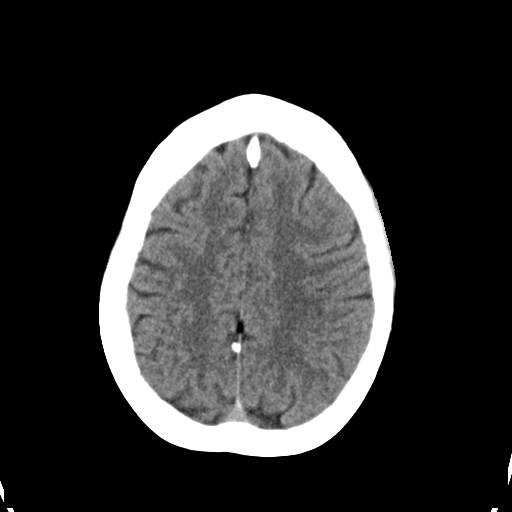
[im 26/32  brain]
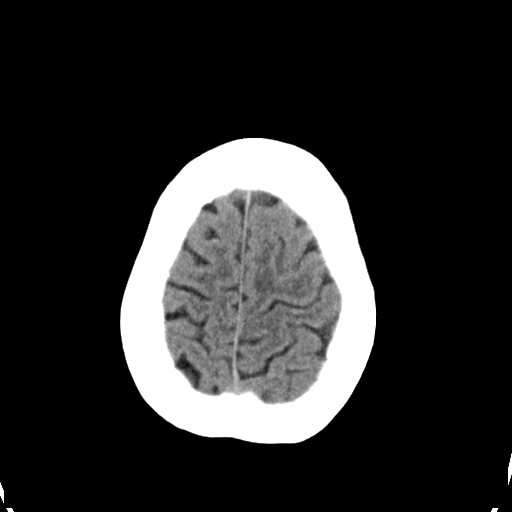
[im 29/32  brain]
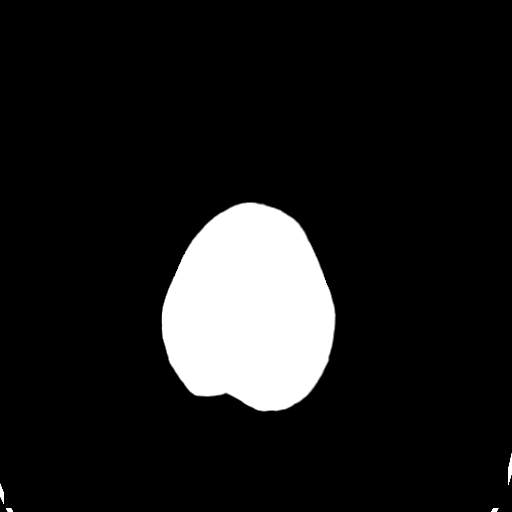
[im 29/32  bone]
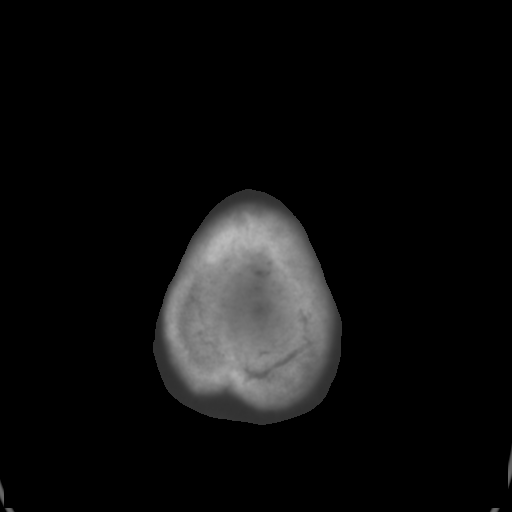

[Series 5: head 3.0 cor st · coronal · 0.29mm/px · 3 of 67 slices shown]
[im 23/67  brain]
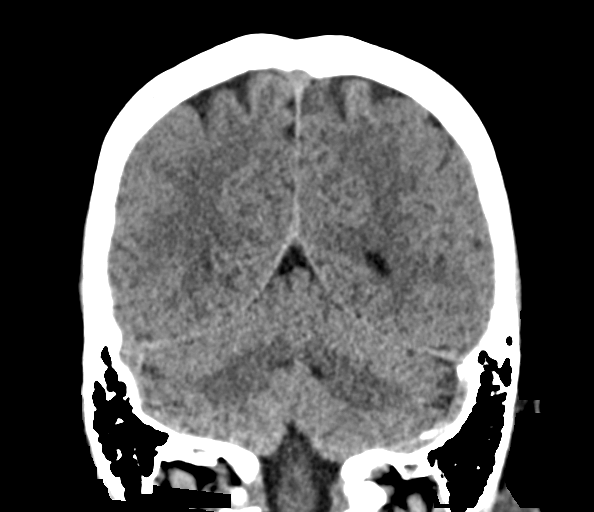
[im 30/67  brain]
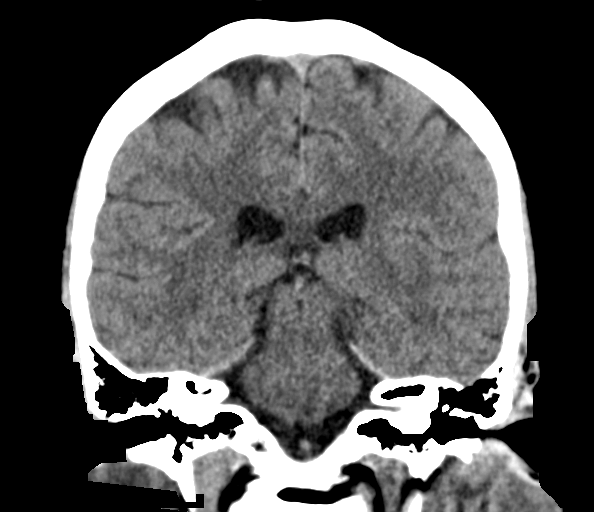
[im 37/67  brain]
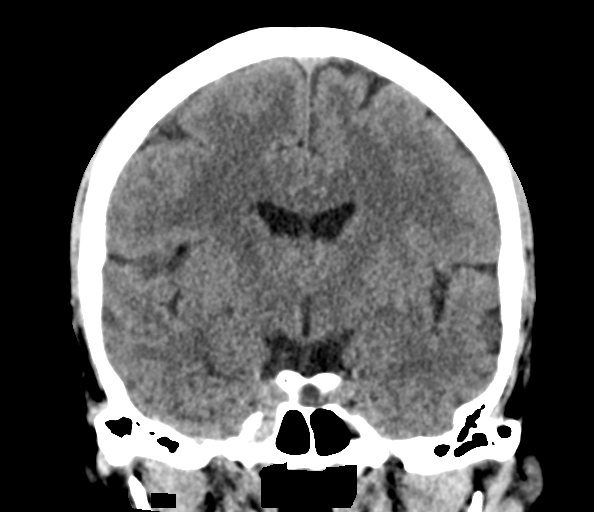

[Series 6: head 3.0 sag st · sagittal · 0.31mm/px · 3 of 56 slices shown]
[im 19/56  brain]
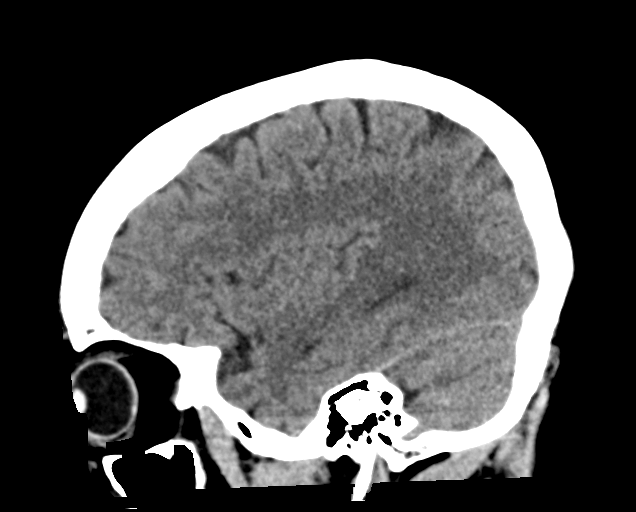
[im 28/56  brain]
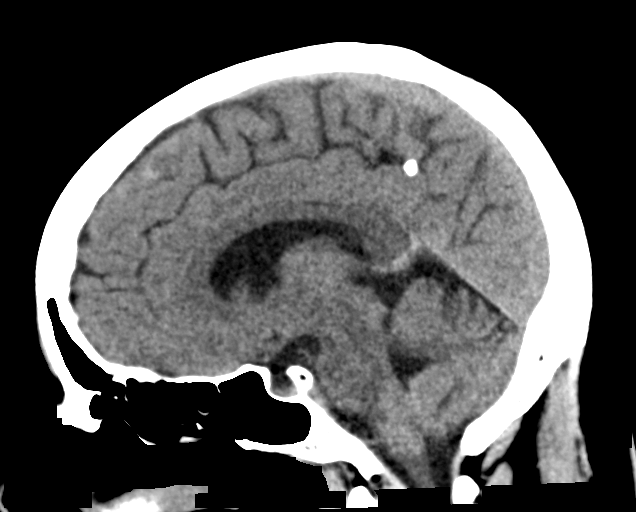
[im 37/56  brain]
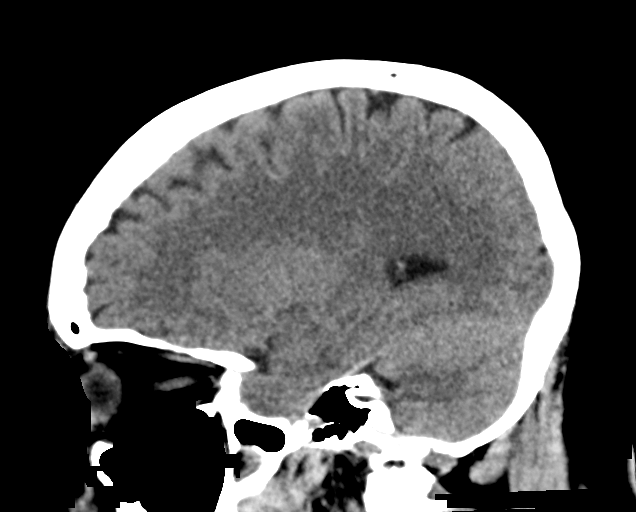

[15 of 47 positions shown; findings below may reference images not displayed]

FINDINGS: Brain: No evidence of acute infarction, hemorrhage, hydrocephalus,
extra-axial collection or mass lesion/mass effect.

Small hypodensity in the inferior right frontal white matter is
unchanged from the prior study and likely is chronic ischemia. No
other ischemic change.

Vascular: Negative for hyperdense vessel

Skull: Negative

Sinuses/Orbits: Mild mucosal edema paranasal sinuses. Negative orbit

Other: None

ASPECTS (Alberta Stroke Program Early CT Score)

- Ganglionic level infarction (caudate, lentiform nuclei, internal
capsule, insula, M1-M3 cortex): 7

- Supraganglionic infarction (M4-M6 cortex): 3

Total score (0-10 with 10 being normal): 10
IMPRESSION: 1. No acute abnormality
2. ASPECTS is 10
3. Small hypodensity right frontal white matter unchanged from 7036
most likely chronic ischemia.
4. Preliminary results texted to Dr. Verkaufsprofil

## 2020-08-14 IMAGING — MR MR HEAD W/O CM
7 of 11 series · 25 of 48 positions shown · non-contrast
Comparison: Head CT 08/14/2019

CLINICAL DATA: Acute right arm and face numbness.  Left leg ataxia.

EXAM:
MRI HEAD WITHOUT CONTRAST
TECHNIQUE: Multiplanar, multiecho pulse sequences of the brain and surrounding
structures were obtained without intravenous contrast.

[Series 2: DWI · axial · 3.0mm · 0.94mm/px · z∈[-52,+93]mm · 7 of 100 slices shown (1 of 2)]
[im 1/100]
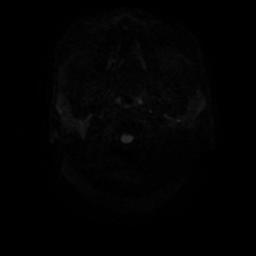
[im 17/100]
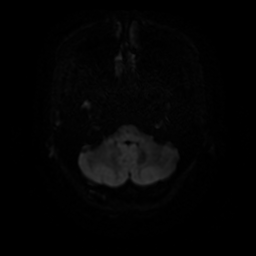
[im 34/100]
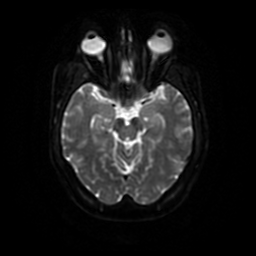
[im 50/100]
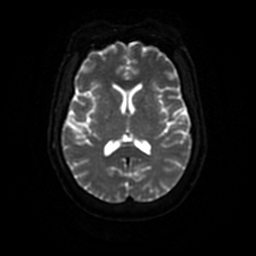
[im 67/100]
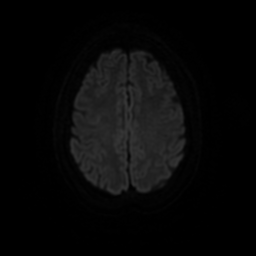
[im 83/100]
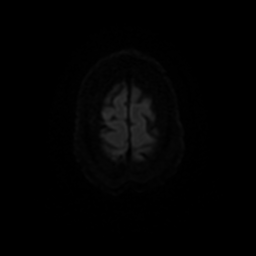
[im 100/100]
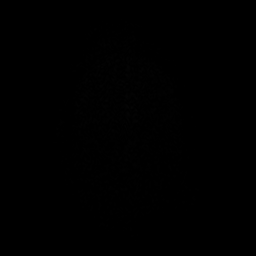

[Series 3: DWI · coronal · 4.0mm · 0.94mm/px · 6 of 74 slices shown (2 of 2)]
[im 1/74]
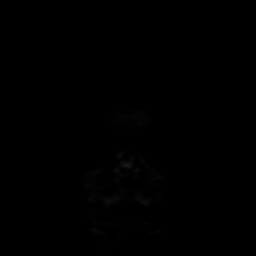
[im 15/74]
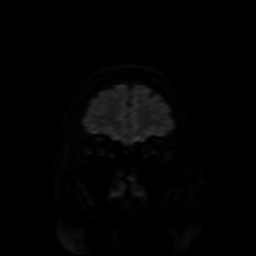
[im 30/74]
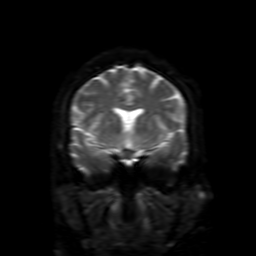
[im 44/74]
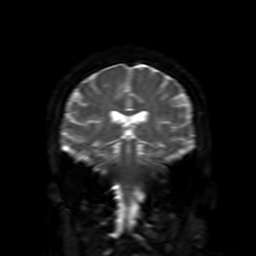
[im 59/74]
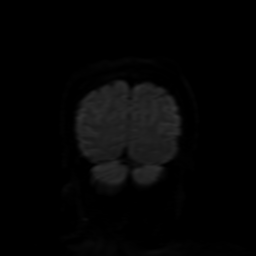
[im 74/74]
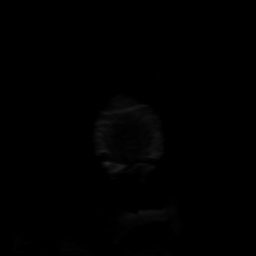

[Series 4: FLAIR · sagittal · 5.0mm · 0.23mm/px · 2 of 26 slices shown (1 of 2)]
[im 1/26]
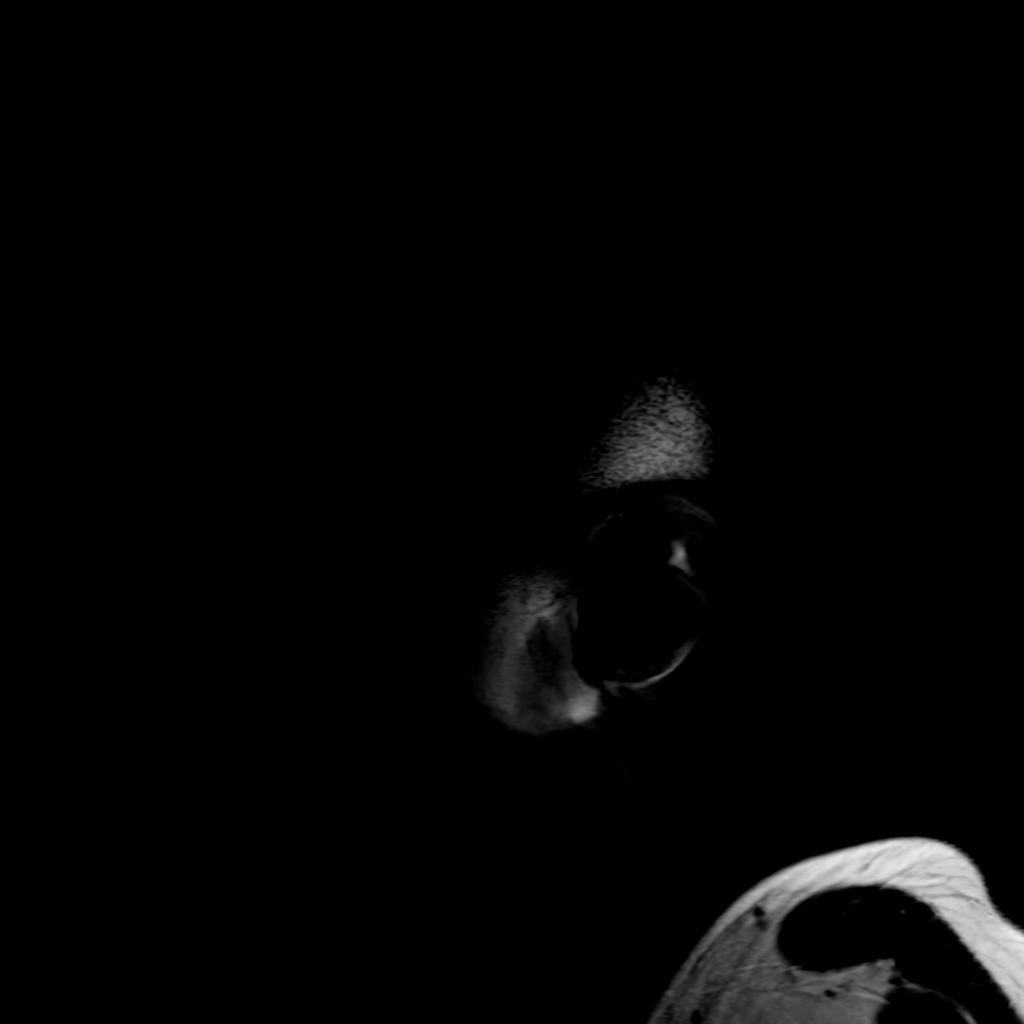
[im 26/26]
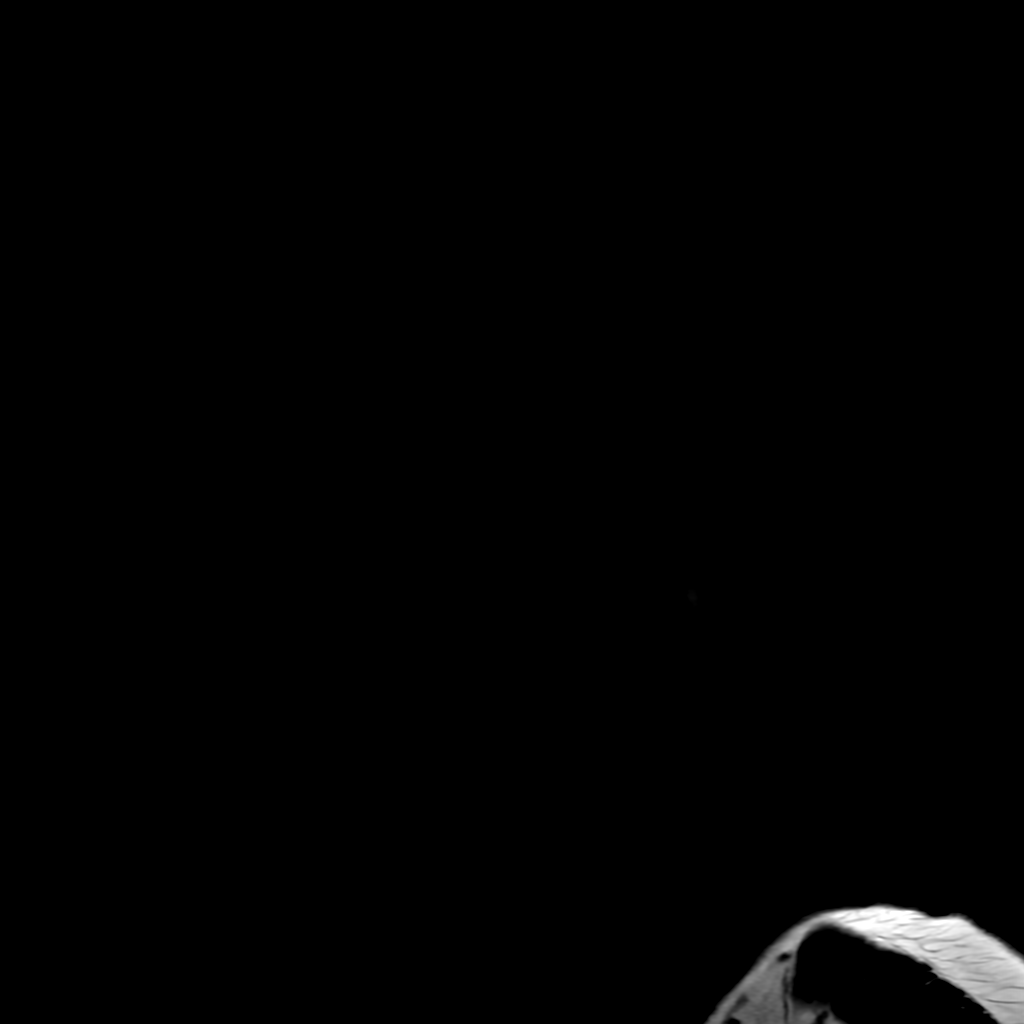

[Series 5: T2 · axial · 5.0mm · 0.23mm/px · 1 of 26 slices shown]
[im 1/26]
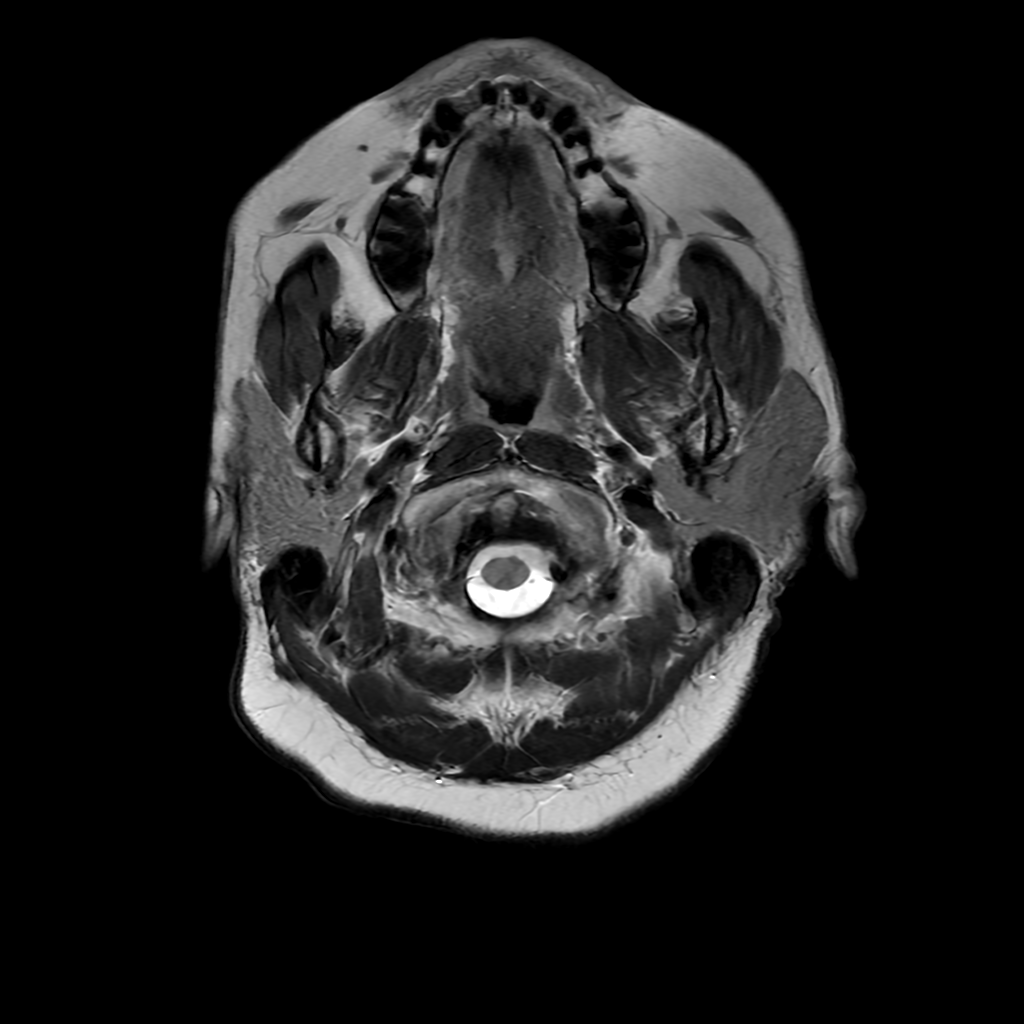

[Series 6: FLAIR · axial · 3.0mm · 0.41mm/px · z∈[-61,+86]mm · 2 of 26 slices shown (2 of 2)]
[im 1/26]
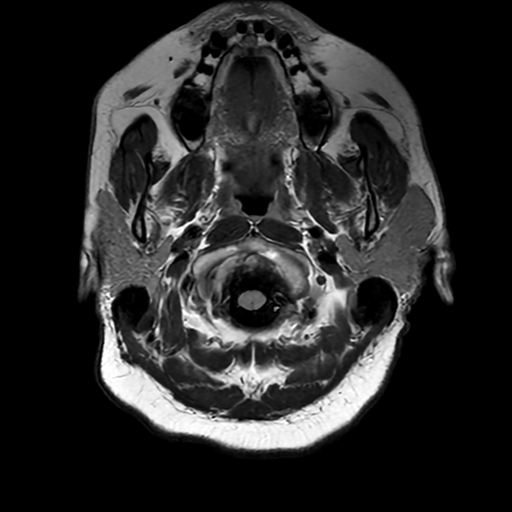
[im 26/26]
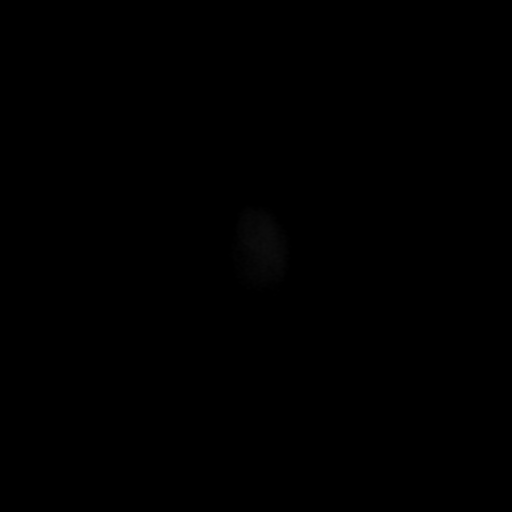

[Series 250: ADC · axial · 3.0mm · 0.94mm/px · z∈[-52,+90]mm · 4 of 49 slices shown (1 of 2)]
[im 1/49]
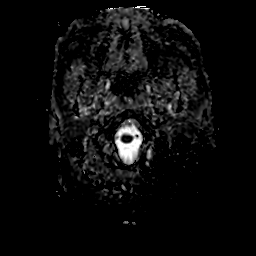
[im 17/49]
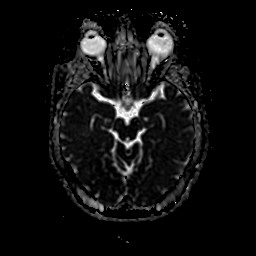
[im 33/49]
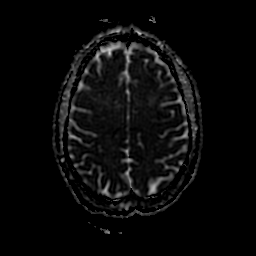
[im 49/49]
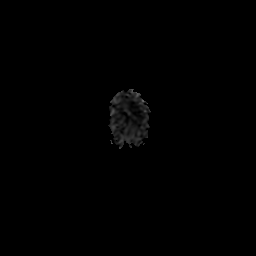

[Series 350: ADC · coronal · 4.0mm · 0.94mm/px · 3 of 37 slices shown (2 of 2)]
[im 1/37]
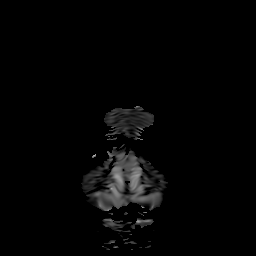
[im 19/37]
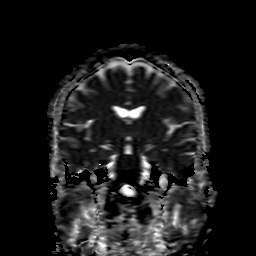
[im 37/37]
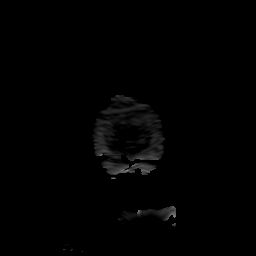

[25 of 48 positions shown; findings below may reference images not displayed]

FINDINGS: Brain: There is no evidence of acute infarct, intracranial
hemorrhage, mass, midline shift, or extra-axial fluid collection.
The ventricles and sulci are normal. Small foci of T2 hyperintensity
in the cerebral white matter bilaterally are nonspecific but
compatible with mild chronic small vessel ischemic disease. There is
a mildly expanded partially empty sella.

Vascular: Major intracranial vascular flow voids are preserved.

Skull and upper cervical spine: Unremarkable bone marrow signal.

Sinuses/Orbits: Unremarkable orbits. Paranasal sinuses and mastoid
air cells are clear.

Other: None.
IMPRESSION: 1. No acute intracranial abnormality.
2. Mild chronic small vessel ischemic disease.

## 2020-12-31 ENCOUNTER — Encounter (HOSPITAL_COMMUNITY): Payer: Self-pay

## 2020-12-31 ENCOUNTER — Emergency Department (HOSPITAL_COMMUNITY)
Admission: EM | Admit: 2020-12-31 | Discharge: 2020-12-31 | Disposition: A | Discharge status: Home / Self Care | Payer: Medicaid Other | Attending: Emergency Medicine | Admitting: Emergency Medicine

## 2020-12-31 ENCOUNTER — Emergency Department (HOSPITAL_COMMUNITY): Payer: Medicaid Other

## 2020-12-31 DIAGNOSIS — U071 COVID-19: Secondary | ICD-10-CM | POA: Insufficient documentation

## 2020-12-31 DIAGNOSIS — Z79899 Other long term (current) drug therapy: Secondary | ICD-10-CM | POA: Insufficient documentation

## 2020-12-31 DIAGNOSIS — Z87891 Personal history of nicotine dependence: Secondary | ICD-10-CM | POA: Insufficient documentation

## 2020-12-31 DIAGNOSIS — R000 Tachycardia, unspecified: Secondary | ICD-10-CM | POA: Diagnosis not present

## 2020-12-31 DIAGNOSIS — E1122 Type 2 diabetes mellitus with diabetic chronic kidney disease: Secondary | ICD-10-CM | POA: Diagnosis not present

## 2020-12-31 DIAGNOSIS — Z7984 Long term (current) use of oral hypoglycemic drugs: Secondary | ICD-10-CM | POA: Diagnosis not present

## 2020-12-31 DIAGNOSIS — I129 Hypertensive chronic kidney disease with stage 1 through stage 4 chronic kidney disease, or unspecified chronic kidney disease: Secondary | ICD-10-CM | POA: Insufficient documentation

## 2020-12-31 DIAGNOSIS — E039 Hypothyroidism, unspecified: Secondary | ICD-10-CM | POA: Diagnosis not present

## 2020-12-31 DIAGNOSIS — R0602 Shortness of breath: Secondary | ICD-10-CM | POA: Diagnosis present

## 2020-12-31 DIAGNOSIS — N183 Chronic kidney disease, stage 3 unspecified: Secondary | ICD-10-CM | POA: Insufficient documentation

## 2020-12-31 LAB — CBC WITH DIFFERENTIAL/PLATELET
Abs Immature Granulocytes: 0.01 10*3/uL (ref 0.00–0.07)
Basophils Absolute: 0 10*3/uL (ref 0.0–0.1)
Basophils Relative: 0 %
Eosinophils Absolute: 0.1 10*3/uL (ref 0.0–0.5)
Eosinophils Relative: 1 %
HCT: 40.5 % (ref 36.0–46.0)
Hemoglobin: 12.9 g/dL (ref 12.0–15.0)
Immature Granulocytes: 0 %
Lymphocytes Relative: 33 %
Lymphs Abs: 2 10*3/uL (ref 0.7–4.0)
MCH: 27.8 pg (ref 26.0–34.0)
MCHC: 31.9 g/dL (ref 30.0–36.0)
MCV: 87.3 fL (ref 80.0–100.0)
Monocytes Absolute: 0.7 10*3/uL (ref 0.1–1.0)
Monocytes Relative: 11 %
Neutro Abs: 3.2 10*3/uL (ref 1.7–7.7)
Neutrophils Relative %: 55 %
Platelets: 238 10*3/uL (ref 150–400)
RBC: 4.64 MIL/uL (ref 3.87–5.11)
RDW: 14.6 % (ref 11.5–15.5)
WBC: 6 10*3/uL (ref 4.0–10.5)
nRBC: 0 % (ref 0.0–0.2)

## 2020-12-31 LAB — TROPONIN I (HIGH SENSITIVITY): Troponin I (High Sensitivity): <2 ng/L (ref <18)

## 2020-12-31 LAB — BASIC METABOLIC PANEL
Anion gap: 8 (ref 5–15)
BUN: 11 mg/dL (ref 6–20)
CO2: 25 mmol/L (ref 22–32)
Calcium: 8.9 mg/dL (ref 8.9–10.3)
Chloride: 106 mmol/L (ref 98–111)
Creatinine, Ser: 0.9 mg/dL (ref 0.44–1.00)
GFR, Estimated: >60 mL/min (ref >60)
Glucose, Bld: 100 mg/dL — ABNORMAL HIGH (ref 70–99)
Potassium: 4.2 mmol/L (ref 3.5–5.1)
Sodium: 139 mmol/L (ref 135–145)

## 2020-12-31 MED ORDER — DIPHENHYDRAMINE HCL 50 MG/ML IJ SOLN
25.0000 mg | Freq: Once | INTRAMUSCULAR | Status: AC
  Administered 2020-12-31: 25 mg via INTRAVENOUS
  Filled 2020-12-31: qty 1

## 2020-12-31 MED ORDER — NIRMATRELVIR/RITONAVIR (PAXLOVID)TABLET: 3.0000 | ORAL_TABLET | Freq: Two times a day (BID) | ORAL | 0 refills | Status: AC

## 2020-12-31 MED ORDER — DEXAMETHASONE SODIUM PHOSPHATE 10 MG/ML IJ SOLN
10.0000 mg | Freq: Once | INTRAMUSCULAR | Status: AC
  Administered 2020-12-31: 10 mg via INTRAVENOUS
  Filled 2020-12-31: qty 1

## 2020-12-31 MED ORDER — ALBUTEROL SULFATE HFA 108 (90 BASE) MCG/ACT IN AERS
2.0000 | INHALATION_SPRAY | Freq: Once | RESPIRATORY_TRACT | Status: AC
  Administered 2020-12-31: 2 via RESPIRATORY_TRACT
  Filled 2020-12-31: qty 6.7

## 2020-12-31 MED ORDER — METOCLOPRAMIDE HCL 5 MG/ML IJ SOLN
10.0000 mg | Freq: Once | INTRAMUSCULAR | Status: AC
  Administered 2020-12-31: 10 mg via INTRAVENOUS
  Filled 2020-12-31: qty 2

## 2020-12-31 MED ORDER — ACETAMINOPHEN 325 MG PO TABS
650.0000 mg | ORAL_TABLET | Freq: Once | ORAL | Status: AC
  Administered 2020-12-31: 650 mg via ORAL
  Filled 2020-12-31: qty 2

## 2020-12-31 NOTE — ED Triage Notes (Signed)
Pt presents via EMS from home for SOB. Per EMS, pt was diagnosed Covid positive today, prior to arrival. Pt was tested at Urgent Care one day ago and confirmed Covid positive today. Pt c/o accompanying sx of CP secondary to coughing and headache. Pt states she has been taking Tylenol for her headache and last Tylenol was taken around 1100am this morning. Pt has a known health hx of "tachycardia" per EMS and takes Metoprolol and is a diabetic.

## 2020-12-31 NOTE — ED Provider Notes (Signed)
[facility] DEPT Provider Note   CSN: [id number] Arrival date & time: 12/31/20  1728     History Chief Complaint  Patient presents with   Shortness of Breath    Covid +    Karen Nixon is a 50 y.o. female hx of DM, GERD, HL, HTN, here with SOB, positive COVID.  Patient tested positive for COVID yesterday.  Patient starting having worsening shortness of breath.  She also has headache as well.  She states that she has some chest pain when she coughs.  She states that she did receive her COVID vaccines.   The history is provided by the patient.      Past Medical History:  Diagnosis Date   Anxiety    DDD (degenerative disc disease), lumbar    Degenerative disc disease, lumbar    Depression    due to chronic pain   Diabetes mellitus without complication ([facility])    Dislocation of shoulder    x 9   GERD (gastroesophageal reflux disease)    Glaucoma    Hyperlipemia    Hypothyroidism    Left facial numbness 2010   and tingling  and left arm- years ago.  Dr says it was stress   Scoliosis    Shortness of breath dyspnea    with exertation   Thyroid disease    Umbilical hernia     Patient Active Problem List   Diagnosis Date Noted   Adjustment disorder with mixed disturbance of emotions and conduct 08/17/2019   Suicidal ideations 08/17/2019   Lumbar radiculopathy, chronic 07/08/2019   Degenerative disc disease at L5-S1 level 03/25/2019   Ventral incisional hernia 03/12/2019   Type 2 diabetes mellitus with hyperglycemia ([place]) 03/07/2019   Chronic kidney disease (CKD), stage III (moderate) ([place]) 02/14/2019   Mixed hyperlipidemia 02/11/2019   MDD (major depressive disorder) 02/11/2019   Foot drop, left foot 12/15/2016   Syncope 04/18/2016   Chronic pain 04/18/2016   Recurrent dislocation of right shoulder 09/09/2014   BREAST MASS, LEFT 09/26/2007   RHINITIS, ALLERGIC NOS 10/26/2006   Graves' disease 10/24/2006   Hypothyroidism 10/24/2006    Hypertension 10/24/2006   HX, PERSONAL, CERVICAL DYSPLASIA 10/24/2006    Past Surgical History:  Procedure Laterality Date   ABDOMINAL EXPOSURE N/A 07/08/2019   Procedure: ABDOMINAL EXPOSURE;  Surgeon: Rosetta Posner, MD;  [facility];  Service: Vascular;  Laterality: N/A;   ANTERIOR LUMBAR FUSION N/A 07/08/2019   Procedure: Lumbar Three-Four Lumbar Four-Five Lumbar Five Sacral One Anterior lumbar interbody fusion;  Surgeon: [name], MD;  Location: [facility];  Service: Neurosurgery;  Laterality: N/A;  Lumbar Three-Four Lumbar Four-Five Lumbar Five Sacral One Anterior lumbar interbody fusion   APPLICATION OF ROBOTIC ASSISTANCE FOR SPINAL PROCEDURE N/A 07/08/2019   Procedure: APPLICATION OF ROBOTIC ASSISTANCE FOR SPINAL PROCEDURE;  Surgeon: [name], MD;  Location: [facility];  Service: Neurosurgery;  Laterality: N/A;   BACK SURGERY  2005   CERVICAL CONIZATION W/BX     ELBOW SURGERY Right 2007   fracture repair with screws   INDUCED ABORTION     LUMBAR PERCUTANEOUS PEDICLE SCREW 3 LEVEL N/A 07/08/2019   Procedure: Posterior percutaneous pedicle screw fixation Lumbar Three to Sacral One;  Surgeon: [name], MD;  Location: [facility];  Service: Neurosurgery;  Laterality: N/A;  Posterior percutaneous pedicle screw fixation Lumbar Three to Sacral One   SHOULDER ARTHROSCOPY Right 09/09/2014   Procedure: RIGHT SHOULDER ARTHROSCOPY WITH LABRAL REPAIR;  Surgeon: Lind Guest  Ninfa Linden, MD;  Location: [facility];  Service: Orthopedics;  Laterality: Right;   SHOULDER SURGERY Right 2001   arthroscopy   TUBAL LIGATION  11/20/1996     OB History     Gravida  3   Para  2   Term  2   Preterm      AB  1   Living  2      SAB      IAB      Ectopic      Multiple      Live Births              Family History  Problem Relation Age of Onset   Cancer Mother    Hypertension Brother     Social History   Tobacco Use   Smoking status: Former    Types: Cigarettes    Quit date:  11/22/2018    Years since quitting: 2.1   Smokeless tobacco: Never  Vaping Use   Vaping Use: Never used  Substance Use Topics   Alcohol use: Not Currently    Comment: occasionally a couple of beers   Drug use: No    Home Medications Prior to Admission medications   Medication Sig Start Date End Date Taking? Authorizing Provider  APPLE CIDER VINEGAR PO Take 1 capsule by mouth daily.    [provider]  azelastine (ASTELIN) 0.1 % nasal spray Place 2 sprays into both nostrils 2 (two) times daily. Use in each nostril as directed Patient taking differently: Place 2 sprays into both nostrils daily as needed for allergies.  06/28/19   Kennith Gain, MD  calcium elemental as carbonate (CALCIUM ANTACID ULTRA) 400 MG chewable tablet Chew 3,000 mg by mouth daily as needed for heartburn.    [provider]  cetirizine (ZYRTEC) 10 MG tablet Take 10 mg by mouth daily as needed for allergies. Patient not taking: Reported on 02/27/2020    [provider]  EPINEPHrine 0.3 mg/0.3 mL IJ SOAJ injection Inject 0.3 mg into the muscle as needed for anaphylaxis.    [provider]  gabapentin (NEURONTIN) 300 MG capsule Take 1 capsule (300 mg total) by mouth 3 (three) times daily. Patient not taking: Reported on 02/27/2020 01/22/19   Raylene Everts, MD  ibuprofen (ADVIL) 600 MG tablet Take 600 mg by mouth every 8 (eight) hours as needed for moderate pain. Patient not taking: Reported on 02/27/2020    [provider]  Ketotifen Fumarate (ALAWAY OP) Place 1 drop into both eyes daily as needed (allergies).    [provider]  latanoprost (XALATAN) 0.005 % ophthalmic solution Place 1 drop into both eyes at bedtime.  04/04/19   [provider]  levothyroxine (SYNTHROID) 100 MCG tablet Take 1 tablet (100 mcg total) by mouth daily before breakfast. Patient not taking: Reported on 02/27/2020 01/22/19   Raylene Everts, MD  lisinopril (ZESTRIL)  5 MG tablet Take 5 mg by mouth daily.    [provider]  meloxicam (MOBIC) 7.5 MG tablet Take 7.5 mg by mouth 2 (two) times daily. Patient not taking: Reported on 02/27/2020 08/07/19   [provider]  metFORMIN (GLUCOPHAGE-XR) 500 MG 24 hr tablet Take 500 mg by mouth in the morning and at bedtime.    [provider]  Multiple Vitamin (MULTIVITAMIN WITH MINERALS) TABS tablet Take 2 tablets by mouth See admin instructions. vitafusion gummies    [provider]  oxyCODONE (OXY IR/ROXICODONE) 5 MG immediate release  tablet Take 1-2 tablets (5-10 mg total) by mouth every 4 (four) hours as needed for moderate pain or severe pain. Patient not taking: No sig reported 07/11/19   [name], MD  oxyCODONE-acetaminophen (PERCOCET/ROXICET) 5-325 MG tablet Take 1 tablet by mouth See admin instructions. Take 1 tab every 4 to 6 hrs Patient not taking: Reported on 02/27/2020 08/14/19   [provider]  pregabalin (LYRICA) 50 MG capsule Take 1 capsule (50 mg total) by mouth 3 (three) times daily. 08/17/19   Valarie Merino, MD  rosuvastatin (CRESTOR) 20 MG tablet Take 20 mg by mouth daily.    [provider]  Simethicone 180 MG CAPS Take 360 mg by mouth as needed (gas relief).     [provider]  tiZANidine (ZANAFLEX) 2 MG tablet Take 2 tablets (4 mg total) by mouth every 6 (six) hours as needed for muscle spasms. Patient not taking: Reported on 02/27/2020 07/11/19   [name], MD  lovastatin (MEVACOR) 20 MG tablet Take 20 mg by mouth at bedtime.  01/22/19  [provider]  traZODone (DESYREL) 50 MG tablet Take 50 mg by mouth at bedtime.   01/22/19  [provider]    Allergies    Lac bovis, Iodinated diagnostic agents, Naproxen sodium, Eggs or egg-derived products, Iohexol, and Lactose intolerance (gi)  Review of Systems   Review of Systems  Respiratory:  Positive for shortness of breath.   All other systems reviewed and are  negative.  Physical Exam Updated Vital Signs BP (!) 155/106   Pulse 95   Temp 98.9 F (37.2 C) (Oral)   Resp (!) 21   SpO2 96%   Physical Exam Vitals and nursing note reviewed.  Constitutional:      Comments: Coughing, uncomfortable  HENT:     Head: Normocephalic.  Eyes:     Extraocular Movements: Extraocular movements intact.     Pupils: Pupils are equal, round, and reactive to light.  Cardiovascular:     Rate and Rhythm: Normal rate and regular rhythm.  Pulmonary:     Comments: Diminished bilaterally Abdominal:     Palpations: Abdomen is soft.  Musculoskeletal:        General: Normal range of motion.     Cervical back: Normal range of motion and neck supple.  Skin:    General: Skin is warm.     Capillary Refill: Capillary refill takes less than 2 seconds.  Neurological:     General: No focal deficit present.     Mental Status: She is alert and oriented to person, place, and time.    ED Results / Procedures / Treatments   Labs (all labs ordered are listed, but only abnormal results are displayed) Labs Reviewed  BASIC METABOLIC PANEL - Abnormal; Notable for the following components:      Result Value   Glucose, Bld 100 (*)    All other components within normal limits  CBC WITH DIFFERENTIAL/PLATELET  TROPONIN I (HIGH SENSITIVITY)  TROPONIN I (HIGH SENSITIVITY)    EKG EKG Interpretation  Date/Time:  Thursday December 31 2020 19:27:46 EDT Ventricular Rate:  100 PR Interval:  171 QRS Duration: 102 QT Interval:  359 QTC Calculation: 463 R Axis:   49 Text Interpretation: Sinus tachycardia Low voltage, precordial leads Abnormal R-wave progression, early transition Nonspecific T abnormalities, anterior leads No significant change since last tracing Confirmed by Wandra Arthurs ([id number]) on 12/31/2020 7:34:18 PM  Radiology DG Chest Port 1 View  Result Date: 12/31/2020 CLINICAL  DATA:  Shortness of breath.  COVID positive. EXAM: PORTABLE CHEST 1 VIEW COMPARISON:   Radiograph 04/27/2016 FINDINGS: Stable heart size and mediastinal contours, thoracic scoliosis distorts normal mediastinal borders, unchanged. There is no focal airspace disease. No pleural effusion or pneumothorax. No pulmonary edema. Prominent thoracic scoliosis. No acute osseous findings are seen. IMPRESSION: No acute findings. Electronically Signed   By: Keith Rake M.D.   On: 12/31/2020 19:23    Procedures Procedures   Medications Ordered in ED Medications  metoCLOPramide (REGLAN) injection 10 mg (10 mg Intravenous Given 12/31/20 1933)  diphenhydrAMINE (BENADRYL) injection 25 mg (25 mg Intravenous Given 12/31/20 1932)  acetaminophen (TYLENOL) tablet 650 mg (650 mg Oral Given 12/31/20 1932)  albuterol (VENTOLIN HFA) 108 (90 Base) MCG/ACT inhaler 2 puff (2 puffs Inhalation Given 12/31/20 1931)  dexamethasone (DECADRON) injection 10 mg (10 mg Intravenous Given 12/31/20 1945)    ED Course  I have reviewed the triage vital signs and the nursing notes.  Pertinent labs & imaging results that were available during my care of the patient were reviewed by me and considered in my medical decision making (see chart for details).    MDM Rules/Calculators/A&P                           NERY KALISZ is a 50 y.o. female here presenting with shortness of breath.  Tested positive for COVID yesterday.  Patient has a history of hypertension diabetes.  We will get labs and chest x-ray.  Patient has no oxygen requirement currently.  8:56 PM Labs unremarkable.  Chest x-ray showed no infiltrate.  Patient felt better after migraine cocktail and steroids. Will dc home with paxlovid.   Final Clinical Impression(s) / ED Diagnoses Final diagnoses:  None    Rx / DC Orders ED Discharge Orders     None        Drenda Freeze, MD 12/31/20 2059

## 2020-12-31 NOTE — Discharge Instructions (Signed)
I prescribed paxlovid for treatment of COVID   Take tylenol, motrin for fever   Use albuterol every 4 hrs as needed for cough, shortness of breath   You need to self isolate for 5 days per guidelines   See your doctor for follow up   Return to ER if you have worse shortness of breath, cough, headaches, vomiting      Person Under Monitoring Name: Karen Nixon  Location: [address] [place] [address]   Infection Prevention Recommendations for Individuals Confirmed to have, or Being Evaluated for, 2019 Novel Coronavirus (COVID-19) Infection Who Receive Care at Home  Individuals who are confirmed to have, or are being evaluated for, COVID-19 should follow the prevention steps below until a healthcare provider or local or state health department says they can return to normal activities.  Stay home except to get medical care You should restrict activities outside your home, except for getting medical care. Do not go to work, school, or public areas, and do not use public transportation or taxis.  Call ahead before visiting your doctor Before your medical appointment, call the healthcare provider and tell them that you have, or are being evaluated for, COVID-19 infection. This will help the healthcare provider's office take steps to keep other people from getting infected. Ask your healthcare provider to call the local or state health department.  Monitor your symptoms Seek prompt medical attention if your illness is worsening (e.g., difficulty breathing). Before going to your medical appointment, call the healthcare provider and tell them that you have, or are being evaluated for, COVID-19 infection. Ask your healthcare provider to call the local or state health department.  Wear a facemask You should wear a facemask that covers your nose and mouth when you are in the same room with other people and when you visit a healthcare provider. People who live with or visit you  should also wear a facemask while they are in the same room with you.  Separate yourself from other people in your home As much as possible, you should stay in a different room from other people in your home. Also, you should use a separate bathroom, if available.  Avoid sharing household items You should not share dishes, drinking glasses, cups, eating utensils, towels, bedding, or other items with other people in your home. After using these items, you should wash them thoroughly with soap and water.  Cover your coughs and sneezes Cover your mouth and nose with a tissue when you cough or sneeze, or you can cough or sneeze into your sleeve. Throw used tissues in a lined trash can, and immediately wash your hands with soap and water for at least 20 seconds or use an alcohol-based hand rub.  Wash your [facility] your hands often and thoroughly with soap and water for at least 20 seconds. You can use an alcohol-based hand sanitizer if soap and water are not available and if your hands are not visibly dirty. Avoid touching your eyes, nose, and mouth with unwashed hands.   Prevention Steps for Caregivers and Household Members of Individuals Confirmed to have, or Being Evaluated for, COVID-19 Infection Being Cared for in the Home  If you live with, or provide care at home for, a person confirmed to have, or being evaluated for, COVID-19 infection please follow these guidelines to prevent infection:  Follow healthcare provider's instructions Make sure that you understand and can help the patient follow any healthcare provider instructions for all care.  Provide for the patient's basic needs You should help the patient with basic needs in the home and provide support for getting groceries, prescriptions, and other personal needs.  Monitor the patient's symptoms If they are getting sicker, call his or her medical provider and tell them that the patient has, or is being evaluated  for, COVID-19 infection. This will help the healthcare provider's office take steps to keep other people from getting infected. Ask the healthcare provider to call the local or state health department.  Limit the number of people who have contact with the patient If possible, have only one caregiver for the patient. Other household members should stay in another home or place of residence. If this is not possible, they should stay in another room, or be separated from the patient as much as possible. Use a separate bathroom, if available. Restrict visitors who do not have an essential need to be in the home.  Keep older adults, very young children, and other sick people away from the patient Keep older adults, very young children, and those who have compromised immune systems or chronic health conditions away from the patient. This includes people with chronic heart, lung, or kidney conditions, diabetes, and cancer.  Ensure good ventilation Make sure that shared spaces in the home have good air flow, such as from an air conditioner or an opened window, weather permitting.  Wash your hands often Wash your hands often and thoroughly with soap and water for at least 20 seconds. You can use an alcohol based hand sanitizer if soap and water are not available and if your hands are not visibly dirty. Avoid touching your eyes, nose, and mouth with unwashed hands. Use disposable paper towels to dry your hands. If not available, use dedicated cloth towels and replace them when they become wet.  Wear a facemask and gloves Wear a disposable facemask at all times in the room and gloves when you touch or have contact with the patient's blood, body fluids, and/or secretions or excretions, such as sweat, saliva, sputum, nasal mucus, vomit, urine, or feces.  Ensure the mask fits over your nose and mouth tightly, and do not touch it during use. Throw out disposable facemasks and gloves after using them. Do not  reuse. Wash your hands immediately after removing your facemask and gloves. If your personal clothing becomes contaminated, carefully remove clothing and launder. Wash your hands after handling contaminated clothing. Place all used disposable facemasks, gloves, and other waste in a lined container before disposing them with other household waste. Remove gloves and wash your hands immediately after handling these items.  Do not share dishes, glasses, or other household items with the patient Avoid sharing household items. You should not share dishes, drinking glasses, cups, eating utensils, towels, bedding, or other items with a patient who is confirmed to have, or being evaluated for, COVID-19 infection. After the person uses these items, you should wash them thoroughly with soap and water.  Wash laundry thoroughly Immediately remove and wash clothes or bedding that have blood, body fluids, and/or secretions or excretions, such as sweat, saliva, sputum, nasal mucus, vomit, urine, or feces, on them. Wear gloves when handling laundry from the patient. Read and follow directions on labels of laundry or clothing items and detergent. In general, wash and dry with the warmest temperatures recommended on the label.  Clean all areas the individual has used often Clean all touchable surfaces, such as counters, tabletops, doorknobs, bathroom fixtures, toilets, phones, keyboards, tablets,  and bedside tables, every day. Also, clean any surfaces that may have blood, body fluids, and/or secretions or excretions on them. Wear gloves when cleaning surfaces the patient has come in contact with. Use a diluted bleach solution (e.g., dilute bleach with 1 part bleach and 10 parts water) or a household disinfectant with a label that says EPA-registered for coronaviruses. To make a bleach solution at home, add 1 tablespoon of bleach to 1 quart (4 cups) of water. For a larger supply, add  cup of bleach to 1 gallon (16  cups) of water. Read labels of cleaning products and follow recommendations provided on product labels. Labels contain instructions for safe and effective use of the cleaning product including precautions you should take when applying the product, such as wearing gloves or eye protection and making sure you have good ventilation during use of the product. Remove gloves and wash hands immediately after cleaning.  Monitor yourself for signs and symptoms of illness Caregivers and household members are considered close contacts, should monitor their health, and will be asked to limit movement outside of the home to the extent possible. Follow the monitoring steps for close contacts listed on the symptom monitoring form.   ? If you have additional questions, contact your local health department or call the epidemiologist on call at [phone] (available 24/7). ? This guidance is subject to change. For the most up-to-date guidance from [facility], please refer to their website: [email or web address]

## 2021-11-05 ENCOUNTER — Emergency Department (HOSPITAL_COMMUNITY)
Admission: EM | Admit: 2021-11-05 | Discharge: 2021-11-05 | Discharge status: Against Medical Advice | Payer: Medicaid Other | Attending: Emergency Medicine | Admitting: Emergency Medicine

## 2021-11-05 ENCOUNTER — Other Ambulatory Visit: Payer: Self-pay

## 2021-11-05 ENCOUNTER — Encounter (HOSPITAL_COMMUNITY): Payer: Self-pay | Admitting: *Deleted

## 2021-11-05 DIAGNOSIS — Z5321 Procedure and treatment not carried out due to patient leaving prior to being seen by health care provider: Secondary | ICD-10-CM | POA: Diagnosis not present

## 2021-11-05 DIAGNOSIS — R350 Frequency of micturition: Secondary | ICD-10-CM | POA: Diagnosis not present

## 2021-11-05 DIAGNOSIS — R109 Unspecified abdominal pain: Secondary | ICD-10-CM | POA: Diagnosis present

## 2021-11-05 DIAGNOSIS — R110 Nausea: Secondary | ICD-10-CM | POA: Diagnosis not present

## 2021-11-05 LAB — URINALYSIS, ROUTINE W REFLEX MICROSCOPIC
Bilirubin Urine: NEGATIVE
Glucose, UA: NEGATIVE mg/dL
Ketones, ur: NEGATIVE mg/dL
Nitrite: NEGATIVE
Protein, ur: NEGATIVE mg/dL
Specific Gravity, Urine: 1.016 (ref 1.005–1.030)
pH: 5 (ref 5.0–8.0)

## 2021-11-05 LAB — COMPREHENSIVE METABOLIC PANEL
ALT: 26 U/L (ref 0–44)
AST: 26 U/L (ref 15–41)
Albumin: 3.9 g/dL (ref 3.5–5.0)
Alkaline Phosphatase: 76 U/L (ref 38–126)
Anion gap: 9 (ref 5–15)
BUN: 12 mg/dL (ref 6–20)
CO2: 20 mmol/L — ABNORMAL LOW (ref 22–32)
Calcium: 9.5 mg/dL (ref 8.9–10.3)
Chloride: 112 mmol/L — ABNORMAL HIGH (ref 98–111)
Creatinine, Ser: 0.92 mg/dL (ref 0.44–1.00)
GFR, Estimated: >60 mL/min (ref >60)
Glucose, Bld: 209 mg/dL — ABNORMAL HIGH (ref 70–99)
Potassium: 3.5 mmol/L (ref 3.5–5.1)
Sodium: 141 mmol/L (ref 135–145)
Total Bilirubin: 1 mg/dL (ref 0.3–1.2)
Total Protein: 6.8 g/dL (ref 6.5–8.1)

## 2021-11-05 LAB — CBC
HCT: 40.3 % (ref 36.0–46.0)
Hemoglobin: 13.4 g/dL (ref 12.0–15.0)
MCH: 29.6 pg (ref 26.0–34.0)
MCHC: 33.3 g/dL (ref 30.0–36.0)
MCV: 89 fL (ref 80.0–100.0)
Platelets: 240 10*3/uL (ref 150–400)
RBC: 4.53 MIL/uL (ref 3.87–5.11)
RDW: 13.2 % (ref 11.5–15.5)
WBC: 5.9 10*3/uL (ref 4.0–10.5)
nRBC: 0 % (ref 0.0–0.2)

## 2021-11-05 LAB — LIPASE, BLOOD: Lipase: 34 U/L (ref 11–51)

## 2021-11-05 LAB — I-STAT BETA HCG BLOOD, ED (MC, WL, AP ONLY): I-stat hCG, quantitative: <5 m[IU]/mL (ref <5)

## 2021-11-05 NOTE — ED Notes (Signed)
Pt called multiple times no answer 

## 2021-11-05 NOTE — ED Triage Notes (Signed)
Pt having urinary frequency tonight, feels like unable to empty her bladder fully. Denies pain, c/o nausea

## 2024-01-22 NOTE — Therapy (Signed)
 OUTPATIENT PHYSICAL THERAPY THORACOLUMBAR EVALUATION   Patient Name: Karen Nixon MRN: [id number] DOB:[birth year], 53 y.o., female Today's Date: 01/23/2024  END OF SESSION:  PT End of Session - 01/23/24 1307     Visit Number 1    Date for Recertification  03/19/24    Authorization Type [employer]- auth requested    PT Start Time 1226    PT Stop Time 1311    PT Time Calculation (min) 45 min    Activity Tolerance Patient tolerated treatment well    Behavior During Therapy WFL for tasks assessed/performed          Past Medical History:  Diagnosis Date   Anxiety    DDD (degenerative disc disease), lumbar    Degenerative disc disease, lumbar    Depression    due to chronic pain   Diabetes mellitus without complication (HCC)    Dislocation of shoulder    x 9   GERD (gastroesophageal reflux disease)    Glaucoma    Hyperlipemia    Hypothyroidism    Left facial numbness 2010   and tingling  and left arm- years ago.  Dr says it was stress   Scoliosis    Shortness of breath dyspnea    with exertation   Thyroid  disease    Umbilical hernia    Past Surgical History:  Procedure Laterality Date   ABDOMINAL EXPOSURE N/A 07/08/2019   Procedure: ABDOMINAL EXPOSURE;  Surgeon: Oris Krystal FALCON, MD;  Location: [facility] OR;  Service: Vascular;  Laterality: N/A;   ANTERIOR LUMBAR FUSION N/A 07/08/2019   Procedure: Lumbar Three-Four Lumbar Four-Five Lumbar Five Sacral One Anterior lumbar interbody fusion;  Surgeon: Colon Shove, MD;  Location: [facility] OR;  Service: Neurosurgery;  Laterality: N/A;  Lumbar Three-Four Lumbar Four-Five Lumbar Five Sacral One Anterior lumbar interbody fusion   APPLICATION OF ROBOTIC ASSISTANCE FOR SPINAL PROCEDURE N/A 07/08/2019   Procedure: APPLICATION OF ROBOTIC ASSISTANCE FOR SPINAL PROCEDURE;  Surgeon: Colon Shove, MD;  Location: [facility] OR;  Service: Neurosurgery;  Laterality: N/A;   BACK SURGERY  2005   CERVICAL CONIZATION W/BX     ELBOW SURGERY Right 2007    fracture repair with screws   INDUCED ABORTION     LUMBAR PERCUTANEOUS PEDICLE SCREW 3 LEVEL N/A 07/08/2019   Procedure: Posterior percutaneous pedicle screw fixation Lumbar Three to Sacral One;  Surgeon: Colon Shove, MD;  Location: [facility] OR;  Service: Neurosurgery;  Laterality: N/A;  Posterior percutaneous pedicle screw fixation Lumbar Three to Sacral One   SHOULDER ARTHROSCOPY Right 09/09/2014   Procedure: RIGHT SHOULDER ARTHROSCOPY WITH LABRAL REPAIR;  Surgeon: Lonni CINDERELLA Poli, MD;  Location[facility] OR;  Service: Orthopedics;  Laterality: Right;   SHOULDER SURGERY Right 2001   arthroscopy   TUBAL LIGATION  11/20/1996   Patient Active Problem List   Diagnosis Date Noted   Adjustment disorder with mixed disturbance of emotions and conduct 08/17/2019   Suicidal ideations 08/17/2019   Lumbar radiculopathy, chronic 07/08/2019   Degenerative disc disease at L5-S1 level 03/25/2019   Ventral incisional hernia 03/12/2019   Type 2 diabetes mellitus with hyperglycemia (HCC) 03/07/2019   Chronic kidney disease (CKD), stage III (moderate) (HCC) 02/14/2019   Mixed hyperlipidemia 02/11/2019   MDD (major depressive disorder) 02/11/2019   Foot drop, left foot 12/15/2016   Syncope 04/18/2016   Chronic pain 04/18/2016   Recurrent dislocation of right shoulder 09/09/2014   BREAST MASS, LEFT 09/26/2007   RHINITIS, ALLERGIC NOS 10/26/2006   Graves' disease 10/24/2006  Hypothyroidism 10/24/2006   Hypertension 10/24/2006   HX, PERSONAL, CERVICAL DYSPLASIA 10/24/2006    PCP: [facility]  REFERRING PROVIDER: [facility]  REFERRING DIAG:  M54.50 (ICD-10-CM) - Low back pain, unspecified  G89.29 (ICD-10-CM) - Other chronic pain    Rationale for Evaluation and Treatment: Rehabilitation  THERAPY DIAG:  Other low back pain - Plan: PT plan of care cert/re-cert  Muscle weakness (generalized) - Plan: PT plan of care cert/re-cert  Cramp and spasm - Plan: PT plan of care  cert/re-cert  Other abnormalities of gait and mobility - Plan: PT plan of care cert/re-cert  ONSET DATE: chronic with flare-up 10/2023  SUBJECTIVE:                                                                                                                                                                                           SUBJECTIVE STATEMENT: Pt is a 53 y.o. female who presents to PT with a chronic history of LBP,  She had Lt drop foot in 2018 and had spinal fusion in 2021 L3-S1.  She reports that her back pain worsens in the winter months and she would like to avoid pain meds. She also reports that she is concerned about her balance and risk of falling.   PERTINENT HISTORY:  HTN, depression, chronic LBP, s/p spinal fusion L3-S1 in 2021  PAIN: 01/23/24 Are you having pain? Yes: NPRS scale: 5-6/10 Pain location: Lt low back and gluteals  Pain description: stabbing, catch, aching Aggravating factors: rolling in bed, walking, standing to cook  Relieving factors:supine position,pain meds   PRECAUTIONS: Fall  RED FLAGS: None   WEIGHT BEARING RESTRICTIONS: No  FALLS:  Has patient fallen in last 6 months? No  LIVING ENVIRONMENT: Lives with: lives alone Lives in: House/apartment Has following equipment at home: Single point cane, Environmental Consultant - 2 wheeled, and shower chair-uses when having pain  OCCUPATION: disability  PLOF: Independent, Vocation/Vocational requirements: disability, and Leisure: likes the gym but hasn't been there in a year  PATIENT GOALS: reduce LBP, stand longer, get stronger  NEXT MD VISIT: 03/2024  OBJECTIVE:  Note: Objective measures were completed at Evaluation unless otherwise noted.  DIAGNOSTIC FINDINGS:  None   PATIENT SURVEYS:  01/23/24: Modified Oswestry: 11/45 (skipped 1 question)=24% disability    COGNITION: Overall cognitive status: Within functional limits for tasks assessed     SENSATION: WFL  MUSCLE LENGTH: Hamstrings: WFLs with  tension on Lt>Rt   POSTURE: rounded shoulders, forward head, and flexed trunk   PALPATION: Palpable tenderness over Lt>Rt lumbar paraspinals and gluteals  LUMBAR ROM:  WFLs with limited sidbending consistent with multilevel  fusion  LOWER EXTREMITY ROM:    WFLs  LOWER EXTREMITY MMT:   4/5 his, 4+/5 knees, Lt ankle DF 2+/5, Rt ankle 5/5 Core 4-/5    FUNCTIONAL TESTS:  01/23/24 5 times sit to stand: 25.35 seconds without UE support   GAIT: Distance walked: 50 feet  Assistive device utilized: None Level of assistance: Modified independence Comments: Lt foot drop and compensatory pattern to clear the floor.  She uses walker and cane for stability when she has pain.   TREATMENT DATE:  01/23/24:  Findings from evaluation discussed, pt educated on plan of care, HEP initiated.                                                                                                                                  If treatment provided at initial evaluation, no treatment charged due to lack of authorization.       PATIENT EDUCATION:  Education details: HEP, TA activation Person educated: Patient Education method: Explanation, Demonstration, and Handouts Education comprehension: verbalized understanding and returned demonstration  HOME EXERCISE PROGRAM: Access Code: [email or web address] URL: [email or web address] Date: 01/23/2024 Prepared by: Burnard  Exercises - Seated Hamstring Stretch  - 3 x daily - 7 x weekly - 1 sets - 3 reps - 20 hold - Seated Figure 4 Piriformis Stretch  - 3 x daily - 7 x weekly - 1 sets - 3 reps - 20 hold - Clamshell  - 2 x daily - 7 x weekly - 1-2 sets - 10 reps - Supine Transversus Abdominis Bracing - Hands on Stomach  - 5 x daily - 7 x weekly - 1 sets - 10 reps - 5 hold - Seated Transversus Abdominis Bracing  - 5 x daily - 7 x weekly - 1 sets - 10 reps - 5 hold  ASSESSMENT:  CLINICAL IMPRESSION: Patient is a 53 y.o. female who was seen today for  physical therapy evaluation and treatment for chronic LBP. She had foot drop on Lt followed by lumbar fusion L3-S1 in 2021.  She has continued Lt foot drop and wears external AFO outside of her shoe.  She reports that her pain in the low back worsens when the weather is cold.  Pain is 5-6/10 in the Lt low back and gluteals and is worse with standing for daily tasks and housework in addition to rolling in bed.  Pt with core and hip weakness and Lt foot drop with gait, reduced time on 5x sit to stand indicating falls risk.  Tension and trigger points in Lt>Rt lumbar musculature. Patient will benefit from skilled PT to address the below impairments and improve overall function.   OBJECTIVE IMPAIRMENTS: Abnormal gait, decreased activity tolerance, decreased balance, decreased mobility, difficulty walking, decreased strength, hypomobility, increased fascial restrictions, increased muscle spasms, impaired flexibility, improper body mechanics, postural dysfunction, and pain.   ACTIVITY LIMITATIONS: carrying, lifting, standing, squatting, transfers, bed mobility, and locomotion level  PARTICIPATION LIMITATIONS: meal  prep, cleaning, laundry, and community activity  PERSONAL FACTORS: Fitness, Time since onset of injury/illness/exacerbation, and 1-2 comorbidities: Lt foot drop, spinal multilevel fusion are also affecting patient's functional outcome.   REHAB POTENTIAL: Good  CLINICAL DECISION MAKING: evolving   EVALUATION COMPLEXITY: Moderate    GOALS: Goals reviewed with patient? Yes  SHORT TERM GOALS: Target date: 02/20/2024    Be independent in initial HEP Baseline: Goal status: INITIAL  2.  Improve LE strength to perform 5x sit to stand in < or = to 20 seconds to reduce falls risk Baseline: 25.35 seconds  Goal status: INITIAL  3.  Roll in bed with 30% reduction in LBP  Baseline: 6/10  Goal status: INITIAL  4.  Perform 6 min walk test to determine age related norm and improve community  function Baseline:  Goal status: INITIAL    LONG TERM GOALS: Target date: 03/19/2024    Be independent in advanced HEP Baseline:  Goal status: INITIAL  2.  Improve LE strength to perform 5x sit to stand in < or = to 15 seconds to reduce falls risk Baseline: 25.35 seconds  Goal status: INITIAL  3.  Roll in bed with 60% reduction in LBP with rolling in bed Baseline:  Goal status: INITIAL  4.  Verbalize and demonstrate body mechanics modifications for lumbar protection with daily tasks  Baseline:  Goal status: INITIAL  5.  Stand for ADLs and self-care with 60% increased ease due to improved strength/endurance  Baseline:  Goal status: INITIAL   PLAN:  PT FREQUENCY: 2x/week  PT DURATION: 8 weeks  PLANNED INTERVENTIONS: 97110-Therapeutic exercises, 97530- Therapeutic activity, 97112- Neuromuscular re-education, 97535- Self Care, [id number]- Manual therapy, [phone]- Canalith repositioning, [id number]- Aquatic Therapy, H9716- Electrical stimulation (unattended), [phone]- Electrical stimulation (manual), [id number]- Ultrasound, [id number]- Traction (mechanical), [id number] (1-2 muscles), [id number] (3+ muscles)- Dry Needling, Patient/Family education, Stair training, Taping, Joint mobilization, Joint manipulation, Spinal manipulation, Spinal mobilization, Vestibular training, Cryotherapy, and Moist heat.  PLAN FOR NEXT SESSION: review HEP, work on sit to stand, NuStep, core strength, 6 min walk test soon   [employer], PT 01/23/24 1:28 PM

## 2024-01-23 ENCOUNTER — Other Ambulatory Visit: Payer: Self-pay

## 2024-01-23 ENCOUNTER — Ambulatory Visit: Attending: Physician Assistant

## 2024-01-23 DIAGNOSIS — M5459 Other low back pain: Secondary | ICD-10-CM | POA: Insufficient documentation

## 2024-01-23 DIAGNOSIS — M6281 Muscle weakness (generalized): Secondary | ICD-10-CM | POA: Diagnosis present

## 2024-01-23 DIAGNOSIS — R252 Cramp and spasm: Secondary | ICD-10-CM | POA: Diagnosis present

## 2024-01-23 DIAGNOSIS — R2689 Other abnormalities of gait and mobility: Secondary | ICD-10-CM | POA: Diagnosis present

## 2024-02-07 ENCOUNTER — Ambulatory Visit

## 2024-02-14 ENCOUNTER — Ambulatory Visit

## 2024-02-14 DIAGNOSIS — R2689 Other abnormalities of gait and mobility: Secondary | ICD-10-CM | POA: Diagnosis present

## 2024-02-14 DIAGNOSIS — M6281 Muscle weakness (generalized): Secondary | ICD-10-CM | POA: Insufficient documentation

## 2024-02-14 DIAGNOSIS — R252 Cramp and spasm: Secondary | ICD-10-CM | POA: Insufficient documentation

## 2024-02-14 DIAGNOSIS — M5459 Other low back pain: Secondary | ICD-10-CM | POA: Insufficient documentation

## 2024-02-14 NOTE — Therapy (Signed)
 OUTPATIENT PHYSICAL THERAPY TREATMENT   Patient Name: Karen Nixon MRN: [id number] DOB:[birth year], 53 y.o., female Today's Date: 02/14/2024  END OF SESSION:  PT End of Session - 02/14/24 1504     Visit Number 2    Date for Recertification  03/19/24    Authorization Type [employer] Medicaid- 8 visits approved 03/25/23-03/19/24    Authorization - Visit Number 1    Authorization - Number of Visits 8    PT Start Time 1423    PT Stop Time 1503    PT Time Calculation (min) 40 min    Activity Tolerance Patient tolerated treatment well    Behavior During Therapy WFL for tasks assessed/performed           Past Medical History:  Diagnosis Date   Anxiety    DDD (degenerative disc disease), lumbar    Degenerative disc disease, lumbar    Depression    due to chronic pain   Diabetes mellitus without complication (HCC)    Dislocation of shoulder    x 9   GERD (gastroesophageal reflux disease)    Glaucoma    Hyperlipemia    Hypothyroidism    Left facial numbness 2010   and tingling  and left arm- years ago.  Dr says it was stress   Scoliosis    Shortness of breath dyspnea    with exertation   Thyroid  disease    Umbilical hernia    Past Surgical History:  Procedure Laterality Date   ABDOMINAL EXPOSURE N/A 07/08/2019   Procedure: ABDOMINAL EXPOSURE;  Surgeon: Oris Krystal FALCON, MD;  Location: [facility] OR;  Service: Vascular;  Laterality: N/A;   ANTERIOR LUMBAR FUSION N/A 07/08/2019   Procedure: Lumbar Three-Four Lumbar Four-Five Lumbar Five Sacral One Anterior lumbar interbody fusion;  Surgeon: Colon Shove, MD;  Location: [facility] OR;  Service: Neurosurgery;  Laterality: N/A;  Lumbar Three-Four Lumbar Four-Five Lumbar Five Sacral One Anterior lumbar interbody fusion   APPLICATION OF ROBOTIC ASSISTANCE FOR SPINAL PROCEDURE N/A 07/08/2019   Procedure: APPLICATION OF ROBOTIC ASSISTANCE FOR SPINAL PROCEDURE;  Surgeon: Colon Shove, MD;  Location: [facility] OR;  Service: Neurosurgery;  Laterality: N/A;   BACK  SURGERY  2005   CERVICAL CONIZATION W/BX     ELBOW SURGERY Right 2007   fracture repair with screws   INDUCED ABORTION     LUMBAR PERCUTANEOUS PEDICLE SCREW 3 LEVEL N/A 07/08/2019   Procedure: Posterior percutaneous pedicle screw fixation Lumbar Three to Sacral One;  Surgeon: Colon Shove, MD;  Location[facility];  Service: Neurosurgery;  Laterality: N/A;  Posterior percutaneous pedicle screw fixation Lumbar Three to Sacral One   SHOULDER ARTHROSCOPY Right 09/09/2014   Procedure: RIGHT SHOULDER ARTHROSCOPY WITH LABRAL REPAIR;  Surgeon: Lonni CINDERELLA Poli, MD;  Location: [facility] OR;  Service: Orthopedics;  Laterality: Right;   SHOULDER SURGERY Right 2001   arthroscopy   TUBAL LIGATION  11/20/1996   Patient Active Problem List   Diagnosis Date Noted   Adjustment disorder with mixed disturbance of emotions and conduct 08/17/2019   Suicidal ideations 08/17/2019   Lumbar radiculopathy, chronic 07/08/2019   Degenerative disc disease at L5-S1 level 03/25/2019   Ventral incisional hernia 03/12/2019   Type 2 diabetes mellitus with hyperglycemia (HCC) 03/07/2019   Chronic kidney disease (CKD), stage III (moderate) (HCC) 02/14/2019   Mixed hyperlipidemia 02/11/2019   MDD (major depressive disorder) 02/11/2019   Foot drop, left foot 12/15/2016   Syncope 04/18/2016   Chronic pain 04/18/2016   Recurrent dislocation of right shoulder  09/09/2014   BREAST MASS, LEFT 09/26/2007   RHINITIS, ALLERGIC NOS 10/26/2006   Graves' disease 10/24/2006   Hypothyroidism 10/24/2006   Hypertension 10/24/2006   HX, PERSONAL, CERVICAL DYSPLASIA 10/24/2006    PCP: Jacques Credit [facility]  REFERRING PROVIDER: Jacques Credit [facility]  REFERRING DIAG:  M54.50 (ICD-10-CM) - Low back pain, unspecified  G89.29 (ICD-10-CM) - Other chronic pain    Rationale for Evaluation and Treatment: Rehabilitation  THERAPY DIAG:  Other low back pain  Muscle weakness (generalized)  Cramp and spasm  Other abnormalities of gait and  mobility  ONSET DATE: chronic with flare-up 10/2023  SUBJECTIVE:                                                                                                                                                                                           SUBJECTIVE STATEMENT: I haven't been doing my exercises.  I just put a reminder in my phone to do them.   PERTINENT HISTORY:  HTN, depression, chronic LBP, s/p spinal fusion L3-S1 in 2021  PAIN: 02/14/24 Are you having pain? Yes: NPRS scale: 5-6/10 Pain location: Lt low back and gluteals  Pain description: stabbing, catch, aching Aggravating factors: rolling in bed, walking, standing to cook  Relieving factors:supine position,pain meds   PRECAUTIONS: Fall  RED FLAGS: None   WEIGHT BEARING RESTRICTIONS: No  FALLS:  Has patient fallen in last 6 months? No  LIVING ENVIRONMENT: Lives with: lives alone Lives in: House/apartment Has following equipment at home: Single point cane, Environmental Consultant - 2 wheeled, and shower chair-uses when having pain  OCCUPATION: disability  PLOF: Independent, Vocation/Vocational requirements: disability, and Leisure: likes the gym but hasn't been there in a year  PATIENT GOALS: reduce LBP, stand longer, get stronger  NEXT MD VISIT: 03/2024  OBJECTIVE:  Note: Objective measures were completed at Evaluation unless otherwise noted.  DIAGNOSTIC FINDINGS:  None   PATIENT SURVEYS:  01/23/24: Modified Oswestry: 11/45 (skipped 1 question)=24% disability    COGNITION: Overall cognitive status: Within functional limits for tasks assessed     SENSATION: WFL  MUSCLE LENGTH: Hamstrings: WFLs with tension on Lt>Rt   POSTURE: rounded shoulders, forward head, and flexed trunk   PALPATION: Palpable tenderness over Lt>Rt lumbar paraspinals and gluteals  LUMBAR ROM:  WFLs with limited sidbending consistent with multilevel fusion  LOWER EXTREMITY ROM:    WFLs  LOWER EXTREMITY MMT:   4/5 his, 4+/5 knees,  Lt ankle DF 2+/5, Rt ankle 5/5 Core 4-/5  FUNCTIONAL TESTS:  01/23/24 5 times sit to stand: 25.35 seconds without UE support   GAIT: Distance walked: 50 feet  Assistive device utilized: None  Level of assistance: Modified independence Comments: Lt foot drop and compensatory pattern to clear the floor.  She uses walker and cane for stability when she has pain.   TREATMENT DATE:  02/14/24:  NuStep: level 5x 6 min- PT present to discuss progress Seated hamstring stretch 3x20 seconds  TA activation 5 hold x10, ball  Seated figure 4 stretch: 3x20  Ball roll outs: 3 ways x5 each  Standing row and shoulder extension: red band 2x10 each Sidelying clams 2x10 Knee to chest 3x20 seconds bil Sit to stand: 2x5 seated on foam pad    01/23/24:  Findings from evaluation discussed, pt educated on plan of care, HEP initiated.                                                                                                                                  If treatment provided at initial evaluation, no treatment charged due to lack of authorization.       PATIENT EDUCATION:  Education details: HEP, TA activation Person educated: Patient Education method: Explanation, Demonstration, and Handouts Education comprehension: verbalized understanding and returned demonstration  HOME EXERCISE PROGRAM: Access Code: [email or web address] URL: [email or web address] Date: 01/23/2024 Prepared by: Burnard  Exercises - Seated Hamstring Stretch  - 3 x daily - 7 x weekly - 1 sets - 3 reps - 20 hold - Seated Figure 4 Piriformis Stretch  - 3 x daily - 7 x weekly - 1 sets - 3 reps - 20 hold - Clamshell  - 2 x daily - 7 x weekly - 1-2 sets - 10 reps - Supine Transversus Abdominis Bracing - Hands on Stomach  - 5 x daily - 7 x weekly - 1 sets - 10 reps - 5 hold - Seated Transversus Abdominis Bracing  - 5 x daily - 7 x weekly - 1 sets - 10 reps - 5 hold  ASSESSMENT:  CLINICAL IMPRESSION: First time follow-up  with lapse in treatment since 01/23/24.  She reports non-compliance with her HEP and she has not set reminders on her phone to become more consistent. PT monitored throughout session for fatigue and pain and provided verbal cues for form.  Intermittent cueing for scapular depression and TA activation. Patient will benefit from skilled PT to address the below impairments and improve overall function.   OBJECTIVE IMPAIRMENTS: Abnormal gait, decreased activity tolerance, decreased balance, decreased mobility, difficulty walking, decreased strength, hypomobility, increased fascial restrictions, increased muscle spasms, impaired flexibility, improper body mechanics, postural dysfunction, and pain.   ACTIVITY LIMITATIONS: carrying, lifting, standing, squatting, transfers, bed mobility, and locomotion level  PARTICIPATION LIMITATIONS: meal prep, cleaning, laundry, and community activity  PERSONAL FACTORS: Fitness, Time since onset of injury/illness/exacerbation, and 1-2 comorbidities: Lt foot drop, spinal multilevel fusion are also affecting patient's functional outcome.   REHAB POTENTIAL: Good  CLINICAL DECISION MAKING: evolving   EVALUATION COMPLEXITY: Moderate    GOALS: Goals reviewed with patient? Yes  SHORT TERM GOALS: Target  date: 02/20/2024    Be independent in initial HEP Baseline: has not been consistent and will work to be more consistent (02/14/24) Goal status: In progress   2.  Improve LE strength to perform 5x sit to stand in < or = to 20 seconds to reduce falls risk Baseline: 25.35 seconds  Goal status: INITIAL  3.  Roll in bed with 30% reduction in LBP  Baseline: 6/10  Goal status: INITIAL  4.  Perform 6 min walk test to determine comparison to age related norm and improve community function Baseline: her age related norm is 1743 feet Goal status: INITIAL    LONG TERM GOALS: Target date: 03/19/2024    Be independent in advanced HEP Baseline:  Goal status:  INITIAL  2.  Improve LE strength to perform 5x sit to stand in < or = to 15 seconds to reduce falls risk Baseline: 25.35 seconds  Goal status: INITIAL  3.  Roll in bed with 60% reduction in LBP with rolling in bed Baseline:  Goal status: INITIAL  4.  Verbalize and demonstrate body mechanics modifications for lumbar protection with daily tasks  Baseline:  Goal status: INITIAL  5.  Stand for ADLs and self-care with 60% increased ease due to improved strength/endurance  Baseline:  Goal status: INITIAL   PLAN:  PT FREQUENCY: 2x/week  PT DURATION: 8 weeks  PLANNED INTERVENTIONS: 97110-Therapeutic exercises, 97530- Therapeutic activity, 97112- Neuromuscular re-education, 97535- Self Care, 02859- Manual therapy, [phone]- Canalith repositioning, [id number]- Aquatic Therapy, H9716- Electrical stimulation (unattended), [phone]- Electrical stimulation (manual), [id number]- Ultrasound, [id number]- Traction (mechanical), [id number] (1-2 muscles), [id number] (3+ muscles)- Dry Needling, Patient/Family education, Stair training, Taping, Joint mobilization, Joint manipulation, Spinal manipulation, Spinal mobilization, Vestibular training, Cryotherapy, and Moist heat.  PLAN FOR NEXT SESSION: add to HEP if pt is more consistent at home with current program, work on sit to stand, NuStep, core strength, 6 min walk test next week (age related norm is 1743 feet)   Burnard Joy, PT 02/14/2024 3:06 PM

## 2024-02-19 ENCOUNTER — Ambulatory Visit

## 2024-02-19 DIAGNOSIS — R2689 Other abnormalities of gait and mobility: Secondary | ICD-10-CM

## 2024-02-19 DIAGNOSIS — M5459 Other low back pain: Secondary | ICD-10-CM | POA: Diagnosis not present

## 2024-02-19 DIAGNOSIS — R252 Cramp and spasm: Secondary | ICD-10-CM

## 2024-02-19 DIAGNOSIS — M6281 Muscle weakness (generalized): Secondary | ICD-10-CM

## 2024-02-19 NOTE — Therapy (Addendum)
 " OUTPATIENT PHYSICAL THERAPY TREATMENT   Patient Name: Karen Nixon MRN: [id number] DOB:14-Feb-1971, 53 y.o., female Today's Date: 02/19/2024  END OF SESSION:  PT End of Session - 02/19/24 1406     Visit Number 3    Date for Recertification  03/19/24    Authorization Type [employer] Medicaid- 8 visits approved 01/23/24-03/19/24    Authorization - Visit Number 2    Authorization - Number of Visits 8    PT Start Time 1402    PT Stop Time 1445    PT Time Calculation (min) 43 min    Activity Tolerance Patient tolerated treatment well    Behavior During Therapy WFL for tasks assessed/performed           Past Medical History:  Diagnosis Date   Anxiety    DDD (degenerative disc disease), lumbar    Degenerative disc disease, lumbar    Depression    due to chronic pain   Diabetes mellitus without complication (HCC)    Dislocation of shoulder    x 9   GERD (gastroesophageal reflux disease)    Glaucoma    Hyperlipemia    Hypothyroidism    Left facial numbness 2010   and tingling  and left arm- years ago.  Dr says it was stress   Scoliosis    Shortness of breath dyspnea    with exertation   Thyroid  disease    Umbilical hernia    Past Surgical History:  Procedure Laterality Date   ABDOMINAL EXPOSURE N/A 07/08/2019   Procedure: ABDOMINAL EXPOSURE;  Surgeon: Oris Krystal FALCON, MD;  Location: [facility] OR;  Service: Vascular;  Laterality: N/A;   ANTERIOR LUMBAR FUSION N/A 07/08/2019   Procedure: Lumbar Three-Four Lumbar Four-Five Lumbar Five Sacral One Anterior lumbar interbody fusion;  Surgeon: Colon Shove, MD;  Location: [facility] OR;  Service: Neurosurgery;  Laterality: N/A;  Lumbar Three-Four Lumbar Four-Five Lumbar Five Sacral One Anterior lumbar interbody fusion   APPLICATION OF ROBOTIC ASSISTANCE FOR SPINAL PROCEDURE N/A 07/08/2019   Procedure: APPLICATION OF ROBOTIC ASSISTANCE FOR SPINAL PROCEDURE;  Surgeon: Colon Shove, MD;  Location: [facility] OR;  Service: Neurosurgery;  Laterality: N/A;   BACK  SURGERY  2005   CERVICAL CONIZATION W/BX     ELBOW SURGERY Right 2007   fracture repair with screws   INDUCED ABORTION     LUMBAR PERCUTANEOUS PEDICLE SCREW 3 LEVEL N/A 07/08/2019   Procedure: Posterior percutaneous pedicle screw fixation Lumbar Three to Sacral One;  Surgeon: Colon Shove, MD;  Location[facility] OR;  Service: Neurosurgery;  Laterality: N/A;  Posterior percutaneous pedicle screw fixation Lumbar Three to Sacral One   SHOULDER ARTHROSCOPY Right 09/09/2014   Procedure: RIGHT SHOULDER ARTHROSCOPY WITH LABRAL REPAIR;  Surgeon: Lonni CINDERELLA Poli, MD;  Location: [facility] OR;  Service: Orthopedics;  Laterality: Right;   SHOULDER SURGERY Right 2001   arthroscopy   TUBAL LIGATION  11/20/1996   Patient Active Problem List   Diagnosis Date Noted   Adjustment disorder with mixed disturbance of emotions and conduct 08/17/2019   Suicidal ideations 08/17/2019   Lumbar radiculopathy, chronic 07/08/2019   Degenerative disc disease at L5-S1 level 03/25/2019   Ventral incisional hernia 03/12/2019   Type 2 diabetes mellitus with hyperglycemia (HCC) 03/07/2019   Chronic kidney disease (CKD), stage III (moderate) (HCC) 02/14/2019   Mixed hyperlipidemia 02/11/2019   MDD (major depressive disorder) 02/11/2019   Foot drop, left foot 12/15/2016   Syncope 04/18/2016   Chronic pain 04/18/2016   Recurrent dislocation of right  shoulder 09/09/2014   BREAST MASS, LEFT 09/26/2007   RHINITIS, ALLERGIC NOS 10/26/2006   Graves' disease 10/24/2006   Hypothyroidism 10/24/2006   Hypertension 10/24/2006   HX, PERSONAL, CERVICAL DYSPLASIA 10/24/2006    PCP: Jacques Credit [facility]  REFERRING PROVIDER: Jacques Credit [facility]  REFERRING DIAG:  M54.50 (ICD-10-CM) - Low back pain, unspecified  G89.29 (ICD-10-CM) - Other chronic pain    Rationale for Evaluation and Treatment: Rehabilitation  THERAPY DIAG:  Other low back pain  Muscle weakness (generalized)  Cramp and spasm  Other abnormalities of gait and  mobility  ONSET DATE: chronic with flare-up 10/2023  SUBJECTIVE:                                                                                                                                                                                           SUBJECTIVE STATEMENT: The reminders have helped in my phone, I've been consistent with my HEP since then. They are helping to keep me loose and I'm sleeping better.   PERTINENT HISTORY:  HTN, depression, chronic LBP, s/p spinal fusion L3-S1 in 2021  PAIN: 02/14/24 Are you having pain? No, not currently except for pinch/pull that is consistent  PRECAUTIONS: Fall  RED FLAGS: None   WEIGHT BEARING RESTRICTIONS: No  FALLS:  Has patient fallen in last 6 months? No  LIVING ENVIRONMENT: Lives with: lives alone Lives in: House/apartment Has following equipment at home: Single point cane, Environmental Consultant - 2 wheeled, and shower chair-uses when having pain  OCCUPATION: disability  PLOF: Independent, Vocation/Vocational requirements: disability, and Leisure: likes the gym but hasn't been there in a year  PATIENT GOALS: reduce LBP, stand longer, get stronger  NEXT MD VISIT: 03/2024  OBJECTIVE:  Note: Objective measures were completed at Evaluation unless otherwise noted.  DIAGNOSTIC FINDINGS:  None   PATIENT SURVEYS:  01/23/24: Modified Oswestry: 11/45 (skipped 1 question)=24% disability    COGNITION: Overall cognitive status: Within functional limits for tasks assessed     SENSATION: WFL  MUSCLE LENGTH: Hamstrings: WFLs with tension on Lt>Rt   POSTURE: rounded shoulders, forward head, and flexed trunk   PALPATION: Palpable tenderness over Lt>Rt lumbar paraspinals and gluteals  LUMBAR ROM:  WFLs with limited sidbending consistent with multilevel fusion  LOWER EXTREMITY ROM:    WFLs  LOWER EXTREMITY MMT:   4/5 his, 4+/5 knees, Lt ankle DF 2+/5, Rt ankle 5/5 Core 4-/5  FUNCTIONAL TESTS:  01/23/24 5 times sit to stand:  25.35 seconds without UE support   GAIT: Distance walked: 50 feet  Assistive device utilized: None Level of assistance: Modified independence Comments: Lt foot drop and compensatory pattern to clear  the floor.  She uses walker and cane for stability when she has pain.   TREATMENT DATE:  02/19/24: Therapeutic Exercises NuStep: level 5x 6 min- PT present to discuss progress Seated hamstring stretch 3 x 20 seconds  Bil piriformis figure 4 stretch 3 x 20 seconds Ball roll outs: 3 ways x5 each  Therapeutic Activities  Sidelying clams with theraband loop 2 x 10 with VC's during to keep remind pt to keep core engaged Supine bridges with yellow theraband loop above knees to keep LE's hip width 2 x 10, limited ROM due to weakness 6 min walk test - 808 ft with mild antalgic gait and wide BOS gait and age related norm is 1743 feet  02/14/24:  NuStep: level 5x 6 min- PT present to discuss progress Seated hamstring stretch 3x20 seconds  TA activation 5 hold x10, ball  Seated figure 4 stretch: 3x20  Ball roll outs: 3 ways x5 each  Standing row and shoulder extension: red band 2x10 each Sidelying clams 2x10 Knee to chest 3x20 seconds bil Sit to stand: 2x5 seated on foam pad    01/23/24:  Findings from evaluation discussed, pt educated on plan of care, HEP initiated.                                                                                                                                  If treatment provided at initial evaluation, no treatment charged due to lack of authorization.       PATIENT EDUCATION:  Education details: HEP, TA activation Person educated: Patient Education method: Explanation, Demonstration, and Handouts Education comprehension: verbalized understanding and returned demonstration  HOME EXERCISE PROGRAM: Access Code: [email or web address] URL: [email or web address] Date: 01/23/2024 Prepared by: Burnard  Exercises - Seated Hamstring Stretch  - 3 x daily  - 7 x weekly - 1 sets - 3 reps - 20 hold - Seated Figure 4 Piriformis Stretch  - 3 x daily - 7 x weekly - 1 sets - 3 reps - 20 hold - Clamshell  - 2 x daily - 7 x weekly - 1-2 sets - 10 reps - Supine Transversus Abdominis Bracing - Hands on Stomach  - 5 x daily - 7 x weekly - 1 sets - 10 reps - 5 hold - Seated Transversus Abdominis Bracing  - 5 x daily - 7 x weekly - 1 sets - 10 reps - 5 hold  ASSESSMENT:  CLINICAL IMPRESSION: Continued with progression of core strength and hip/back flexibility. Added resistance with supine activities with yellow theraband loop. She reports feeling challenged by these but able to tolerate well. 6 min walk test performed and age related norm is 1743 feet, pt able to do 808 ft so will reassess this in a few weeks to assess for progress.   OBJECTIVE IMPAIRMENTS: Abnormal gait, decreased activity tolerance, decreased balance, decreased mobility, difficulty walking, decreased strength, hypomobility, increased fascial restrictions, increased muscle spasms, impaired flexibility, improper body  mechanics, postural dysfunction, and pain.   ACTIVITY LIMITATIONS: carrying, lifting, standing, squatting, transfers, bed mobility, and locomotion level  PARTICIPATION LIMITATIONS: meal prep, cleaning, laundry, and community activity  PERSONAL FACTORS: Fitness, Time since onset of injury/illness/exacerbation, and 1-2 comorbidities: Lt foot drop, spinal multilevel fusion are also affecting patient's functional outcome.   REHAB POTENTIAL: Good  CLINICAL DECISION MAKING: evolving   EVALUATION COMPLEXITY: Moderate    GOALS: Goals reviewed with patient? Yes  SHORT TERM GOALS: Target date: 02/20/2024    Be independent in initial HEP Baseline: has not been consistent and will work to be more consistent (02/14/24) Goal status: In progress   2.  Improve LE strength to perform 5x sit to stand in < or = to 20 seconds to reduce falls risk Baseline: 25.35 seconds  Goal status:  INITIAL  3.  Roll in bed with 30% reduction in LBP  Baseline: 6/10  Goal status: INITIAL  4.  Perform 6 min walk test to determine comparison to age related norm and improve community function Baseline: her age related norm is 1743 feet Goal status: INITIAL    LONG TERM GOALS: Target date: 03/19/2024    Be independent in advanced HEP Baseline:  Goal status: INITIAL  2.  Improve LE strength to perform 5x sit to stand in < or = to 15 seconds to reduce falls risk Baseline: 25.35 seconds  Goal status: INITIAL  3.  Roll in bed with 60% reduction in LBP with rolling in bed Baseline:  Goal status: INITIAL  4.  Verbalize and demonstrate body mechanics modifications for lumbar protection with daily tasks  Baseline:  Goal status: INITIAL  5.  Stand for ADLs and self-care with 60% increased ease due to improved strength/endurance  Baseline:  Goal status: INITIAL   PLAN:  PT FREQUENCY: 2x/week  PT DURATION: 8 weeks  PLANNED INTERVENTIONS: 97110-Therapeutic exercises, 97530- Therapeutic activity, 97112- Neuromuscular re-education, 97535- Self Care, 02859- Manual therapy, [phone]- Canalith repositioning, [id number]- Aquatic Therapy, H9716- Electrical stimulation (unattended), [phone]- Electrical stimulation (manual), [id number]- Ultrasound, [id number]- Traction (mechanical), [id number] (1-2 muscles), 20561 (3+ muscles)- Dry Needling, Patient/Family education, Stair training, Taping, Joint mobilization, Joint manipulation, Spinal manipulation, Spinal mobilization, Vestibular training, Cryotherapy, and Moist heat.  PLAN FOR NEXT SESSION: add to HEP if pt is more consistent at home with current program, work on sit to stand, NuStep, and core strength   Berwyn Knights, PTA 02/19/2024 2:49 PM   "

## 2024-02-26 ENCOUNTER — Ambulatory Visit

## 2024-02-26 DIAGNOSIS — M5459 Other low back pain: Secondary | ICD-10-CM | POA: Diagnosis not present

## 2024-02-26 DIAGNOSIS — R2689 Other abnormalities of gait and mobility: Secondary | ICD-10-CM

## 2024-02-26 DIAGNOSIS — R252 Cramp and spasm: Secondary | ICD-10-CM

## 2024-02-26 DIAGNOSIS — M6281 Muscle weakness (generalized): Secondary | ICD-10-CM

## 2024-02-26 NOTE — Therapy (Signed)
 " OUTPATIENT PHYSICAL THERAPY TREATMENT   Patient Name: Karen Nixon MRN: [id number] DOB:05/30/1970, 53 y.o., female Today's Date: 02/26/2024  END OF SESSION:  PT End of Session - 02/26/24 1501     Visit Number 4    Date for Recertification  03/19/24    Authorization Type [employer]- 8 visits approved 01/23/24-03/19/24    Authorization - Visit Number 3    Authorization - Number of Visits 8    PT Start Time 1502    PT Stop Time 1548    PT Time Calculation (min) 46 min    Activity Tolerance Patient tolerated treatment well    Behavior During Therapy WFL for tasks assessed/performed           Past Medical History:  Diagnosis Date   Anxiety    DDD (degenerative disc disease), lumbar    Degenerative disc disease, lumbar    Depression    due to chronic pain   Diabetes mellitus without complication (HCC)    Dislocation of shoulder    x 9   GERD (gastroesophageal reflux disease)    Glaucoma    Hyperlipemia    Hypothyroidism    Left facial numbness 2010   and tingling  and left arm- years ago.  Dr says it was stress   Scoliosis    Shortness of breath dyspnea    with exertation   Thyroid  disease    Umbilical hernia    Past Surgical History:  Procedure Laterality Date   ABDOMINAL EXPOSURE N/A 07/08/2019   Procedure: ABDOMINAL EXPOSURE;  Surgeon: Oris Krystal FALCON, MD;  Location: [facility] OR;  Service: Vascular;  Laterality: N/A;   ANTERIOR LUMBAR FUSION N/A 07/08/2019   Procedure: Lumbar Three-Four Lumbar Four-Five Lumbar Five Sacral One Anterior lumbar interbody fusion;  Surgeon: Colon Shove, MD;  Location: [facility] OR;  Service: Neurosurgery;  Laterality: N/A;  Lumbar Three-Four Lumbar Four-Five Lumbar Five Sacral One Anterior lumbar interbody fusion   APPLICATION OF ROBOTIC ASSISTANCE FOR SPINAL PROCEDURE N/A 07/08/2019   Procedure: APPLICATION OF ROBOTIC ASSISTANCE FOR SPINAL PROCEDURE;  Surgeon: Colon Shove, MD;  Location: [facility] OR;  Service: Neurosurgery;  Laterality: N/A;   BACK  SURGERY  2005   CERVICAL CONIZATION W/BX     ELBOW SURGERY Right 2007   fracture repair with screws   INDUCED ABORTION     LUMBAR PERCUTANEOUS PEDICLE SCREW 3 LEVEL N/A 07/08/2019   Procedure: Posterior percutaneous pedicle screw fixation Lumbar Three to Sacral One;  Surgeon: Colon Shove, MD;  Location: [facility] OR;  Service: Neurosurgery;  Laterality: N/A;  Posterior percutaneous pedicle screw fixation Lumbar Three to Sacral One   SHOULDER ARTHROSCOPY Right 09/09/2014   Procedure: RIGHT SHOULDER ARTHROSCOPY WITH LABRAL REPAIR;  Surgeon: Lonni CINDERELLA Poli, MD;  Location: [facility] OR;  Service: Orthopedics;  Laterality: Right;   SHOULDER SURGERY Right 2001   arthroscopy   TUBAL LIGATION  11/20/1996   Patient Active Problem List   Diagnosis Date Noted   Adjustment disorder with mixed disturbance of emotions and conduct 08/17/2019   Suicidal ideations 08/17/2019   Lumbar radiculopathy, chronic 07/08/2019   Degenerative disc disease at L5-S1 level 03/25/2019   Ventral incisional hernia 03/12/2019   Type 2 diabetes mellitus with hyperglycemia (HCC) 03/07/2019   Chronic kidney disease (CKD), stage III (moderate) (HCC) 02/14/2019   Mixed hyperlipidemia 02/11/2019   MDD (major depressive disorder) 02/11/2019   Foot drop, left foot 12/15/2016   Syncope 04/18/2016   Chronic pain 04/18/2016   Recurrent dislocation of right  shoulder 09/09/2014   BREAST MASS, LEFT 09/26/2007   RHINITIS, ALLERGIC NOS 10/26/2006   Graves' disease 10/24/2006   Hypothyroidism 10/24/2006   Hypertension 10/24/2006   HX, PERSONAL, CERVICAL DYSPLASIA 10/24/2006    PCP: Jacques Credit [facility]  REFERRING PROVIDER: Jacques Credit [facility]  REFERRING DIAG:  M54.50 (ICD-10-CM) - Low back pain, unspecified  G89.29 (ICD-10-CM) - Other chronic pain    Rationale for Evaluation and Treatment: Rehabilitation  THERAPY DIAG:  Other low back pain  Muscle weakness (generalized)  Cramp and spasm  Other abnormalities of gait and  mobility  ONSET DATE: chronic with flare-up 10/2023  SUBJECTIVE:                                                                                                                                                                                           SUBJECTIVE STATEMENT: I think my exercises are helping. I am getting them in 2x's per day and trying to prioritize myself.  PERTINENT HISTORY:  HTN, depression, chronic LBP, s/p spinal fusion L3-S1 in 2021  PAIN: 02/14/24 Are you having pain? No, but can feel a pull in the left side at times depending on how I move. PRECAUTIONS: Fall  RED FLAGS: None   WEIGHT BEARING RESTRICTIONS: No  FALLS:  Has patient fallen in last 6 months? No  LIVING ENVIRONMENT: Lives with: lives alone Lives in: House/apartment Has following equipment at home: Single point cane, Environmental Consultant - 2 wheeled, and shower chair-uses when having pain  OCCUPATION: disability  PLOF: Independent, Vocation/Vocational requirements: disability, and Leisure: likes the gym but hasn't been there in a year  PATIENT GOALS: reduce LBP, stand longer, get stronger  NEXT MD VISIT: 03/2024  OBJECTIVE:  Note: Objective measures were completed at Evaluation unless otherwise noted.  DIAGNOSTIC FINDINGS:  None   PATIENT SURVEYS:  01/23/24: Modified Oswestry: 11/45 (skipped 1 question)=24% disability    COGNITION: Overall cognitive status: Within functional limits for tasks assessed     SENSATION: WFL  MUSCLE LENGTH: Hamstrings: WFLs with tension on Lt>Rt   POSTURE: rounded shoulders, forward head, and flexed trunk   PALPATION: Palpable tenderness over Lt>Rt lumbar paraspinals and gluteals  LUMBAR ROM:  WFLs with limited sidbending consistent with multilevel fusion  LOWER EXTREMITY ROM:    WFLs  LOWER EXTREMITY MMT:   4/5 his, 4+/5 knees, Lt ankle DF 2+/5, Rt ankle 5/5 Core 4-/5  FUNCTIONAL TESTS:  01/23/24 5 times sit to stand: 25.35 seconds without UE support    GAIT: Distance walked: 50 feet  Assistive device utilized: None Level of assistance: Modified independence Comments: Lt foot drop and compensatory pattern to clear the  floor.  She uses walker and cane for stability when she has pain.   TREATMENT DATE:  02/26/2024 Nu step seat 5, UE 9 x 6 min Standing row and shoulder ext with TrA 2 x 10 ea,red Sit to stand from foam pad 3 x 5  Seated HS stretch x 3 B 20 sec  Supine figure 4 stretch x 3 B, 20 sec Supine hip clam with TrA 2 x 10 Bridge with band around knees 2 x 10 TrA with marching alternate feet x 10 TrA with march and alternate hand to knee x 10 02/19/24: Therapeutic Exercises NuStep: level 5x 6 min- PT present to discuss progress Seated hamstring stretch 3 x 20 seconds  Bil piriformis figure 4 stretch 3 x 20 seconds Ball roll outs: 3 ways x5 each  Therapeutic Activities  Sidelying clams with theraband loop 2 x 10 with VC's during to keep remind pt to keep core engaged Supine bridges with yellow theraband loop above knees to keep LE's hip width 2 x 10, limited ROM due to weakness 6 min walk test - 808 ft with mild antalgic gait and wide BOS gait and age related norm is 1743 feet  02/14/24:  NuStep: level 5x 6 min- PT present to discuss progress Seated hamstring stretch 3x20 seconds  TA activation 5 hold x10, ball  Seated figure 4 stretch: 3x20  Ball roll outs: 3 ways x5 each  Standing row and shoulder extension: red band 2x10 each Sidelying clams 2x10 Knee to chest 3x20 seconds bil Sit to stand: 2x5 seated on foam pad    01/23/24:  Findings from evaluation discussed, pt educated on plan of care, HEP initiated.                                                                                                                                  If treatment provided at initial evaluation, no treatment charged due to lack of authorization.       PATIENT EDUCATION:  Education details: HEP, TA activation Person educated:  Patient Education method: Explanation, Demonstration, and Handouts Education comprehension: verbalized understanding and returned demonstration  HOME EXERCISE PROGRAM: Access Code: [email or web address] URL: [email or web address] Date: 01/23/2024 Prepared by: Burnard  Exercises - Seated Hamstring Stretch  - 3 x daily - 7 x weekly - 1 sets - 3 reps - 20 hold - Seated Figure 4 Piriformis Stretch  - 3 x daily - 7 x weekly - 1 sets - 3 reps - 20 hold - Clamshell  - 2 x daily - 7 x weekly - 1-2 sets - 10 reps - Supine Transversus Abdominis Bracing - Hands on Stomach  - 5 x daily - 7 x weekly - 1 sets - 10 reps - 5 hold - Seated Transversus Abdominis Bracing  - 5 x daily - 7 x weekly - 1 sets - 10 reps - 5 hold  ASSESSMENT:  CLINICAL IMPRESSION:  Pt has been more compliant  with her HEP and is noticing improvements in her pain levels. She required only occasional VC's for activities today. She felt very good at completion of therapy with no increased pain.  OBJECTIVE IMPAIRMENTS: Abnormal gait, decreased activity tolerance, decreased balance, decreased mobility, difficulty walking, decreased strength, hypomobility, increased fascial restrictions, increased muscle spasms, impaired flexibility, improper body mechanics, postural dysfunction, and pain.   ACTIVITY LIMITATIONS: carrying, lifting, standing, squatting, transfers, bed mobility, and locomotion level  PARTICIPATION LIMITATIONS: meal prep, cleaning, laundry, and community activity  PERSONAL FACTORS: Fitness, Time since onset of injury/illness/exacerbation, and 1-2 comorbidities: Lt foot drop, spinal multilevel fusion are also affecting patient's functional outcome.   REHAB POTENTIAL: Good  CLINICAL DECISION MAKING: evolving   EVALUATION COMPLEXITY: Moderate    GOALS: Goals reviewed with patient? Yes  SHORT TERM GOALS: Target date: 02/20/2024    Be independent in initial HEP Baseline: has not been consistent and will work to  be more consistent (02/14/24) Goal status: In progress   2.  Improve LE strength to perform 5x sit to stand in < or = to 20 seconds to reduce falls risk Baseline: 25.35 seconds  Goal status: INITIAL  3.  Roll in bed with 30% reduction in LBP  Baseline: 6/10  Goal status: INITIAL  4.  Perform 6 min walk test to determine comparison to age related norm and improve community function Baseline: her age related norm is 1743 feet Goal status: INITIAL    LONG TERM GOALS: Target date: 03/19/2024    Be independent in advanced HEP Baseline:  Goal status: INITIAL  2.  Improve LE strength to perform 5x sit to stand in < or = to 15 seconds to reduce falls risk Baseline: 25.35 seconds  Goal status: INITIAL  3.  Roll in bed with 60% reduction in LBP with rolling in bed Baseline:  Goal status: INITIAL  4.  Verbalize and demonstrate body mechanics modifications for lumbar protection with daily tasks  Baseline:  Goal status: INITIAL  5.  Stand for ADLs and self-care with 60% increased ease due to improved strength/endurance  Baseline:  Goal status: INITIAL   PLAN:  PT FREQUENCY: 2x/week  PT DURATION: 8 weeks  PLANNED INTERVENTIONS: 97110-Therapeutic exercises, 97530- Therapeutic activity, 97112- Neuromuscular re-education, 97535- Self Care, [id number]- Manual therapy, [id number]- Canalith repositioning, [id number]- Aquatic Therapy, [id number]- Electrical stimulation (unattended), [phone]- Electrical stimulation (manual), [id number]- Ultrasound, [id number]- Traction (mechanical), [id number] (1-2 muscles), [id number] (3+ muscles)- Dry Needling, Patient/Family education, Stair training, Taping, Joint mobilization, Joint manipulation, Spinal manipulation, Spinal mobilization, Vestibular training, Cryotherapy, and Moist heat.  PLAN FOR NEXT SESSION: add to HEP if pt is more consistent at home with current program, work on sit to stand, NuStep, and core strength   Grayce Sheldon 02/26/2024 3:50 PM   "

## 2024-03-04 ENCOUNTER — Ambulatory Visit

## 2024-03-11 ENCOUNTER — Ambulatory Visit: Attending: Physician Assistant

## 2024-03-11 DIAGNOSIS — M5459 Other low back pain: Secondary | ICD-10-CM | POA: Insufficient documentation

## 2024-03-11 DIAGNOSIS — R252 Cramp and spasm: Secondary | ICD-10-CM | POA: Insufficient documentation

## 2024-03-11 DIAGNOSIS — R2689 Other abnormalities of gait and mobility: Secondary | ICD-10-CM | POA: Insufficient documentation

## 2024-03-11 DIAGNOSIS — M6281 Muscle weakness (generalized): Secondary | ICD-10-CM | POA: Diagnosis present

## 2024-03-11 NOTE — Therapy (Signed)
 " OUTPATIENT PHYSICAL THERAPY TREATMENT   Patient Name: Karen Nixon MRN: [id number] DOB:[birth year], 54 y.o., female Today's Date: 03/11/2024  END OF SESSION:  PT End of Session - 03/11/24 1533     Visit Number 5    Date for Recertification  03/19/24    Authorization - Visit Number 4    Authorization - Number of Visits 8    PT Start Time 1501    PT Stop Time 1530    PT Time Calculation (min) 29 min    Activity Tolerance Patient tolerated treatment well    Behavior During Therapy WFL for tasks assessed/performed            Past Medical History:  Diagnosis Date   Anxiety    DDD (degenerative disc disease), lumbar    Degenerative disc disease, lumbar    Depression    due to chronic pain   Diabetes mellitus without complication (HCC)    Dislocation of shoulder    x 9   GERD (gastroesophageal reflux disease)    Glaucoma    Hyperlipemia    Hypothyroidism    Left facial numbness 2010   and tingling  and left arm- years ago.  Dr says it was stress   Scoliosis    Shortness of breath dyspnea    with exertation   Thyroid  disease    Umbilical hernia    Past Surgical History:  Procedure Laterality Date   ABDOMINAL EXPOSURE N/A 07/08/2019   Procedure: ABDOMINAL EXPOSURE;  Surgeon: Oris Krystal FALCON, MD;  Location: [facility] OR;  Service: Vascular;  Laterality: N/A;   ANTERIOR LUMBAR FUSION N/A 07/08/2019   Procedure: Lumbar Three-Four Lumbar Four-Five Lumbar Five Sacral One Anterior lumbar interbody fusion;  Surgeon: Colon Shove, MD;  Location: [facility] OR;  Service: Neurosurgery;  Laterality: N/A;  Lumbar Three-Four Lumbar Four-Five Lumbar Five Sacral One Anterior lumbar interbody fusion   APPLICATION OF ROBOTIC ASSISTANCE FOR SPINAL PROCEDURE N/A 07/08/2019   Procedure: APPLICATION OF ROBOTIC ASSISTANCE FOR SPINAL PROCEDURE;  Surgeon: Colon Shove, MD;  Location: [facility] OR;  Service: Neurosurgery;  Laterality: N/A;   BACK SURGERY  2005   CERVICAL CONIZATION W/BX     ELBOW SURGERY Right 2007    fracture repair with screws   INDUCED ABORTION     LUMBAR PERCUTANEOUS PEDICLE SCREW 3 LEVEL N/A 07/08/2019   Procedure: Posterior percutaneous pedicle screw fixation Lumbar Three to Sacral One;  Surgeon: Colon Shove, MD;  Location[facility] OR;  Service: Neurosurgery;  Laterality: N/A;  Posterior percutaneous pedicle screw fixation Lumbar Three to Sacral One   SHOULDER ARTHROSCOPY Right 09/09/2014   Procedure: RIGHT SHOULDER ARTHROSCOPY WITH LABRAL REPAIR;  Surgeon: Lonni CINDERELLA Poli, MD;  Location: [facility] OR;  Service: Orthopedics;  Laterality: Right;   SHOULDER SURGERY Right 2001   arthroscopy   TUBAL LIGATION  11/20/1996   Patient Active Problem List   Diagnosis Date Noted   Adjustment disorder with mixed disturbance of emotions and conduct 08/17/2019   Suicidal ideations 08/17/2019   Lumbar radiculopathy, chronic 07/08/2019   Degenerative disc disease at L5-S1 level 03/25/2019   Ventral incisional hernia 03/12/2019   Type 2 diabetes mellitus with hyperglycemia (HCC) 03/07/2019   Chronic kidney disease (CKD), stage III (moderate) (HCC) 02/14/2019   Mixed hyperlipidemia 02/11/2019   MDD (major depressive disorder) 02/11/2019   Foot drop, left foot 12/15/2016   Syncope 04/18/2016   Chronic pain 04/18/2016   Recurrent dislocation of right shoulder 09/09/2014   BREAST MASS, LEFT 09/26/2007  RHINITIS, ALLERGIC NOS 10/26/2006   Graves' disease 10/24/2006   Hypothyroidism 10/24/2006   Hypertension 10/24/2006   HX, PERSONAL, CERVICAL DYSPLASIA 10/24/2006    PCP: Jacques Credit [facility]  REFERRING PROVIDER: Jacques Credit [facility]  REFERRING DIAG:  M54.50 (ICD-10-CM) - Low back pain, unspecified  G89.29 (ICD-10-CM) - Other chronic pain    Rationale for Evaluation and Treatment: Rehabilitation  THERAPY DIAG:  Other low back pain  Muscle weakness (generalized)  Cramp and spasm  Other abnormalities of gait and mobility  ONSET DATE: chronic with flare-up 10/2023  SUBJECTIVE:                                                                                                                                                                                            SUBJECTIVE STATEMENT: I am doing my exercises 2x/day.  No longer having pain and working to get stronger.    PERTINENT HISTORY:  HTN, depression, chronic LBP, s/p spinal fusion L3-S1 in 2021  PAIN: 02/14/24 Are you having pain? No, but can feel a pull in the left side at times depending on how I move. PRECAUTIONS: Fall  RED FLAGS: None   WEIGHT BEARING RESTRICTIONS: No  FALLS:  Has patient fallen in last 6 months? No  LIVING ENVIRONMENT: Lives with: lives alone Lives in: House/apartment Has following equipment at home: Single point cane, Environmental Consultant - 2 wheeled, and shower chair-uses when having pain  OCCUPATION: disability  PLOF: Independent, Vocation/Vocational requirements: disability, and Leisure: likes the gym but hasn't been there in a year  PATIENT GOALS: reduce LBP, stand longer, get stronger  NEXT MD VISIT: 03/2024  OBJECTIVE:  Note: Objective measures were completed at Evaluation unless otherwise noted.  DIAGNOSTIC FINDINGS:  None   PATIENT SURVEYS:  01/23/24: Modified Oswestry: 11/45 (skipped 1 question)=24% disability    COGNITION: Overall cognitive status: Within functional limits for tasks assessed     SENSATION: WFL  MUSCLE LENGTH: Hamstrings: WFLs with tension on Lt>Rt   POSTURE: rounded shoulders, forward head, and flexed trunk   PALPATION: Palpable tenderness over Lt>Rt lumbar paraspinals and gluteals  LUMBAR ROM:  WFLs with limited sidbending consistent with multilevel fusion  LOWER EXTREMITY ROM:    WFLs  LOWER EXTREMITY MMT:   4/5 his, 4+/5 knees, Lt ankle DF 2+/5, Rt ankle 5/5 Core 4-/5  FUNCTIONAL TESTS:  01/23/24 5 times sit to stand: 25.35 seconds without UE support  03/11/24: 18.86 seconds without hands   GAIT: Distance walked: 50 feet  Assistive device  utilized: None Level of assistance: Modified independence Comments: Lt foot drop and compensatory pattern to clear the floor.  She uses walker  and cane for stability when she has pain.   TREATMENT DATE: 03/11/24: Therapeutic Exercises NuStep: level 5x 6 min- PT present to discuss progress Seated hamstring stretch 3 x 20 seconds  Bil piriformis figure 4 stretch 3 x 20 seconds Ball roll outs: 3 ways x5 each  Therapeutic Activities  Sidelying clams with  yellow theraband loop 2 x 10 with VC's during to keep remind pt to keep core engaged Sit to stand 2x10 with min use of hands 2x10 Supine bridges with yellow theraband loop above knees to keep LE's hip width x15 Sidestepping with yellow band around thighs x3 laps along edge of mat   02/26/2024 Nu step seat 5, UE 9 x 6 min Standing row and shoulder ext with TrA 2 x 10 ea,red Sit to stand from foam pad 3 x 5  Seated HS stretch x 3 B 20 sec  Supine figure 4 stretch x 3 B, 20 sec Supine hip clam with TrA 2 x 10 Bridge with band around knees 2 x 10 TrA with marching alternate feet x 10 TrA with march and alternate hand to knee x 10 02/19/24: Therapeutic Exercises NuStep: level 5x 6 min- PT present to discuss progress Seated hamstring stretch 3 x 20 seconds  Bil piriformis figure 4 stretch 3 x 20 seconds Ball roll outs: 3 ways x5 each  Therapeutic Activities  Sidelying clams with theraband loop 2 x 10 with VC's during to keep remind pt to keep core engaged Supine bridges with yellow theraband loop above knees to keep LE's hip width 2 x 10, limited ROM due to weakness 6 min walk test - 808 ft with mild antalgic gait and wide BOS gait and age related norm is 1743 feet   PATIENT EDUCATION:  Education details: HEP, TA activation Person educated: Patient Education method: Explanation, Demonstration, and Handouts Education comprehension: verbalized understanding and returned demonstration  HOME EXERCISE PROGRAM: Access Code:  [email or web address] URL: [email or web address] Date: 01/23/2024 Prepared by: Burnard  Exercises - Seated Hamstring Stretch  - 3 x daily - 7 x weekly - 1 sets - 3 reps - 20 hold - Seated Figure 4 Piriformis Stretch  - 3 x daily - 7 x weekly - 1 sets - 3 reps - 20 hold - Clamshell  - 2 x daily - 7 x weekly - 1-2 sets - 10 reps - Supine Transversus Abdominis Bracing - Hands on Stomach  - 5 x daily - 7 x weekly - 1 sets - 10 reps - 5 hold - Seated Transversus Abdominis Bracing  - 5 x daily - 7 x weekly - 1 sets - 10 reps - 5 hold  ASSESSMENT:  CLINICAL IMPRESSION: Pt has not been able to attend due to transportation.  She has been compliant with her HEP and PT added to HEP for standing strength.  She reports no low back pain at this time.  She continues to work to get strong and would like to D/C today to HEP.  5x sit to stand is improved although still at falls risk.  PT added to HEP to address this.   OBJECTIVE IMPAIRMENTS: Abnormal gait, decreased activity tolerance, decreased balance, decreased mobility, difficulty walking, decreased strength, hypomobility, increased fascial restrictions, increased muscle spasms, impaired flexibility, improper body mechanics, postural dysfunction, and pain.   ACTIVITY LIMITATIONS: carrying, lifting, standing, squatting, transfers, bed mobility, and locomotion level  PARTICIPATION LIMITATIONS: meal prep, cleaning, laundry, and community activity  PERSONAL FACTORS: Fitness, Time since onset of injury/illness/exacerbation, and 1-2 comorbidities: Lt  foot drop, spinal multilevel fusion are also affecting patient's functional outcome.   REHAB POTENTIAL: Good  CLINICAL DECISION MAKING: evolving   EVALUATION COMPLEXITY: Moderate    GOALS: Goals reviewed with patient? Yes  SHORT TERM GOALS: Target date: 02/20/2024    Be independent in initial HEP Baseline: independent and compliant (03/11/24) Goal status:MET  2.  Improve LE strength to perform 5x sit  to stand in < or = to 20 seconds to reduce falls risk Baseline: 25.35 seconds  Goal status: INITIAL  3.  Roll in bed with 30% reduction in LBP  Baseline: no pain (03/11/24) Goal status: INITIAL  4.  Perform 6 min walk test to determine comparison to age related norm and improve community function Baseline: her age related norm is 1743 feet Goal status: INITIAL    LONG TERM GOALS: Target date: 03/19/2024    Be independent in advanced HEP Baseline:  Goal status: INITIAL  2.  Improve LE strength to perform 5x sit to stand in < or = to 15 seconds to reduce falls risk Baseline: see above  Goal status: partially met   3.  Roll in bed with 60% reduction in LBP with rolling in bed Baseline: 03/11/24 no pain right now  Goal status: MET  4.  Verbalize and demonstrate body mechanics modifications for lumbar protection with daily tasks  Baseline:  Goal status: MET  5.  Stand for ADLs and self-care with 60% increased ease due to improved strength/endurance  Baseline: 100% (03/11/24) Goal status: MET   PLAN:  PHYSICAL THERAPY DISCHARGE SUMMARY  Visits from Start of Care: 5  Current functional level related to goals / functional outcomes: Functional strength deficits of a chronic nature.  She has HEP in place to address this.    Remaining deficits: See above   Education / Equipment: HEP, body mechanics    Patient agrees to discharge. Patient goals were partially met. Patient is being discharged due to being pleased with the current functional level.    Burnard Joy, PT 03/11/2024 4:17 PM    "

## 2024-03-18 ENCOUNTER — Ambulatory Visit

## 2024-03-20 ENCOUNTER — Other Ambulatory Visit: Payer: Self-pay | Admitting: Medical Genetics
